# Patient Record
Sex: Female | Born: 1955 | Race: White | Hispanic: No | Marital: Married | Smoking: Never smoker
Health system: Southern US, Community
[De-identification: ages and names within clinical notes are randomized; demographics above are authoritative.]

## PROBLEM LIST (undated history)

## (undated) DIAGNOSIS — Z8781 Personal history of (healed) traumatic fracture: Secondary | ICD-10-CM

## (undated) DIAGNOSIS — I10 Essential (primary) hypertension: Secondary | ICD-10-CM

## (undated) DIAGNOSIS — C801 Malignant (primary) neoplasm, unspecified: Secondary | ICD-10-CM

## (undated) DIAGNOSIS — H209 Unspecified iridocyclitis: Secondary | ICD-10-CM

## (undated) DIAGNOSIS — R7303 Prediabetes: Secondary | ICD-10-CM

## (undated) DIAGNOSIS — E061 Subacute thyroiditis: Secondary | ICD-10-CM

## (undated) DIAGNOSIS — Z9889 Other specified postprocedural states: Secondary | ICD-10-CM

## (undated) DIAGNOSIS — K52831 Collagenous colitis: Secondary | ICD-10-CM

## (undated) DIAGNOSIS — T7840XA Allergy, unspecified, initial encounter: Secondary | ICD-10-CM

## (undated) DIAGNOSIS — M1990 Unspecified osteoarthritis, unspecified site: Secondary | ICD-10-CM

## (undated) DIAGNOSIS — H269 Unspecified cataract: Secondary | ICD-10-CM

## (undated) DIAGNOSIS — E785 Hyperlipidemia, unspecified: Secondary | ICD-10-CM

## (undated) DIAGNOSIS — K219 Gastro-esophageal reflux disease without esophagitis: Secondary | ICD-10-CM

## (undated) DIAGNOSIS — G510 Bell's palsy: Secondary | ICD-10-CM

## (undated) DIAGNOSIS — E559 Vitamin D deficiency, unspecified: Secondary | ICD-10-CM

## (undated) HISTORY — DX: Bell's palsy: G51.0

## (undated) HISTORY — DX: Essential (primary) hypertension: I10

## (undated) HISTORY — DX: Gastro-esophageal reflux disease without esophagitis: K21.9

## (undated) HISTORY — DX: Unspecified iridocyclitis: H20.9

## (undated) HISTORY — DX: Allergy, unspecified, initial encounter: T78.40XA

## (undated) HISTORY — DX: Prediabetes: R73.03

## (undated) HISTORY — DX: Unspecified cataract: H26.9

## (undated) HISTORY — DX: Other specified postprocedural states: Z98.890

## (undated) HISTORY — PX: FRACTURE SURGERY: SHX138

## (undated) HISTORY — DX: Collagenous colitis: K52.831

## (undated) HISTORY — DX: Hyperlipidemia, unspecified: E78.5

## (undated) HISTORY — DX: Malignant (primary) neoplasm, unspecified: C80.1

## (undated) HISTORY — DX: Unspecified osteoarthritis, unspecified site: M19.90

## (undated) HISTORY — DX: Vitamin D deficiency, unspecified: E55.9

## (undated) HISTORY — DX: Subacute thyroiditis: E06.1

## (undated) HISTORY — DX: Personal history of (healed) traumatic fracture: Z87.81

## (undated) HISTORY — PX: ABDOMINAL HYSTERECTOMY: SHX81

## (undated) HISTORY — PX: TONSILLECTOMY AND ADENOIDECTOMY: SUR1326

---
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
## 1992-01-17 DIAGNOSIS — Z8781 Personal history of (healed) traumatic fracture: Secondary | ICD-10-CM

## 1992-01-17 DIAGNOSIS — Z9889 Other specified postprocedural states: Secondary | ICD-10-CM

## 1992-01-17 HISTORY — DX: Other specified postprocedural states: Z98.890

## 1992-01-17 HISTORY — DX: Personal history of (healed) traumatic fracture: Z87.81

## 1992-01-17 HISTORY — PX: OTHER SURGICAL HISTORY: SHX169

## 2001-01-16 HISTORY — PX: STAPEDECTOMY: SHX2435

## 2001-11-22 ENCOUNTER — Encounter: Admission: RE | Admit: 2001-11-22 | Discharge: 2001-11-22 | Payer: Self-pay | Admitting: Family Medicine

## 2001-11-22 ENCOUNTER — Encounter: Payer: Self-pay | Admitting: Family Medicine

## 2001-12-10 ENCOUNTER — Other Ambulatory Visit: Admission: RE | Admit: 2001-12-10 | Discharge: 2001-12-10 | Payer: Self-pay | Admitting: Family Medicine

## 2001-12-31 ENCOUNTER — Encounter: Payer: Self-pay | Admitting: Family Medicine

## 2001-12-31 ENCOUNTER — Encounter: Admission: RE | Admit: 2001-12-31 | Discharge: 2001-12-31 | Payer: Self-pay | Admitting: Family Medicine

## 2002-12-23 ENCOUNTER — Other Ambulatory Visit: Admission: RE | Admit: 2002-12-23 | Discharge: 2002-12-23 | Payer: Self-pay | Admitting: Family Medicine

## 2002-12-29 ENCOUNTER — Encounter: Admission: RE | Admit: 2002-12-29 | Discharge: 2002-12-29 | Payer: Self-pay | Admitting: Family Medicine

## 2003-02-13 ENCOUNTER — Encounter: Admission: RE | Admit: 2003-02-13 | Discharge: 2003-02-13 | Payer: Self-pay | Admitting: Family Medicine

## 2004-12-10 IMAGING — US US PELVIS COMPLETE MODIFY
1 series · 14 of 25 positions shown · non-contrast
Comparison: none

CLINICAL DATA: Pelvic pain, question fibroids, history of heavy menses. 
COMPLETE PELVIC/TRANSVAGINAL ULTRASOUND
Transabdominal and transvaginal ultrasound of the pelvis were performed.  The uterus measures 12.1cm sagittally with a depth of 7.0cm and width of 7.4cm.  There are at least three uterine fibroids present.  The largest is in the anterior mid upper aspect measuring 3.1 x 3.5 x 2.2cm.  A second is posteriorly located in the mid body of the uterus measuring 2.5 x 2.7 x 2.9cm with a third smaller lesion posteriorly in the fundus.  The endometrium is normal measuring 7.2mm in thickness.  The right ovary is very difficult to visualize.  The best measurement is 1.8 x 1.4 x 2.4cm with a small follicle present.  The left ovary evaluation is also limited with the left ovary measuring 2.5 x 1.6 x 1.9cm.  No free fluid is seen.  
IMPRESSION
At least three uterine fibroids, one or two of which appear to be very near the endometrium.  No endometrial abnormality is seen.  Ovaries are not optimally visualized as noted above.

[Series 1: unknown · 0.29mm/px · 14 of 46 slices shown]
[im 1/46]
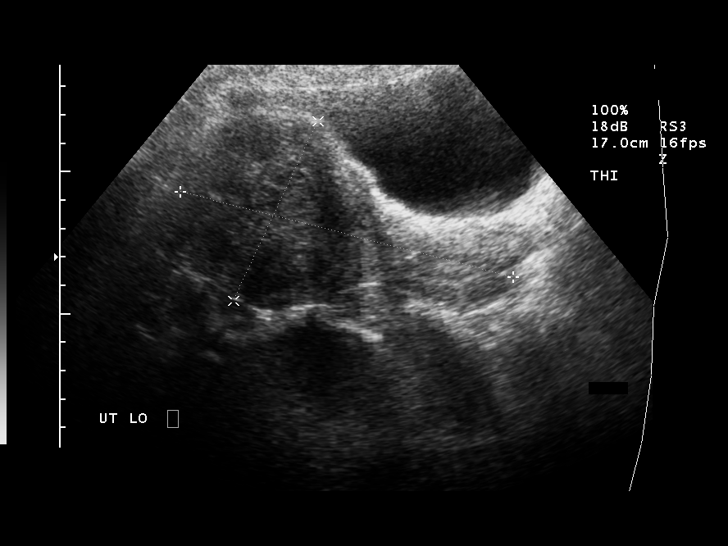
[im 4/46]
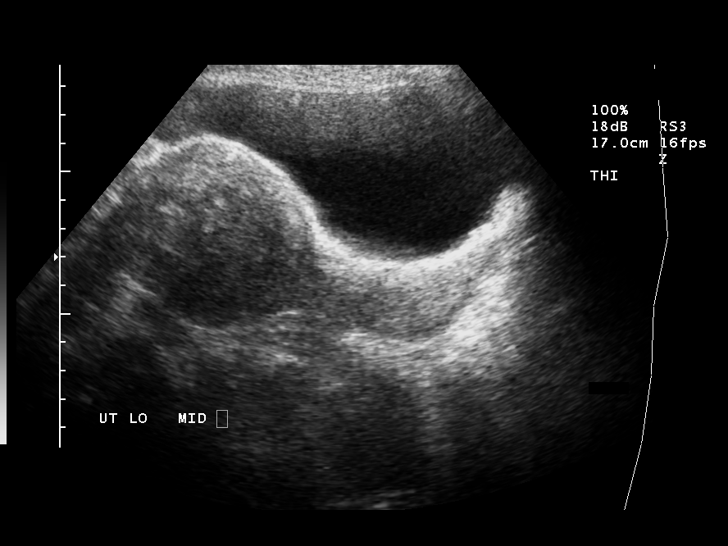
[im 8/46]
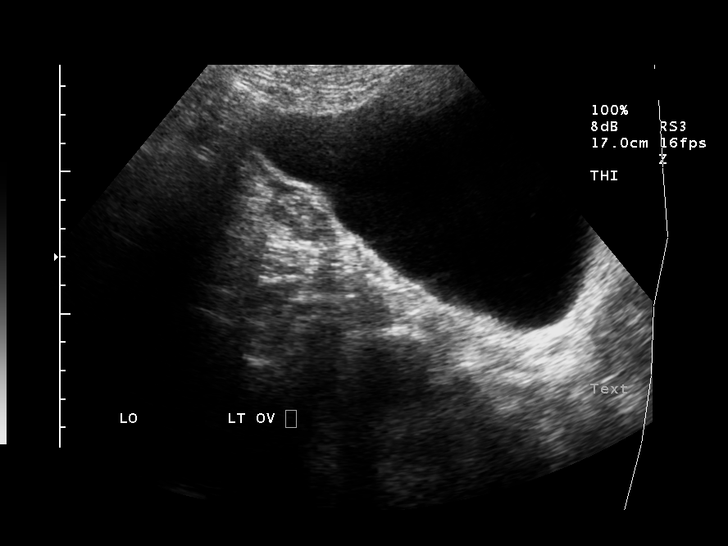
[im 12/46]
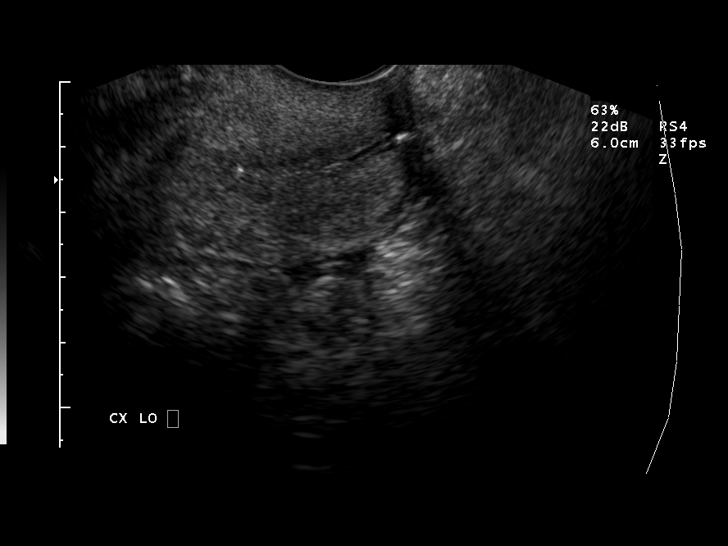
[im 16/46]
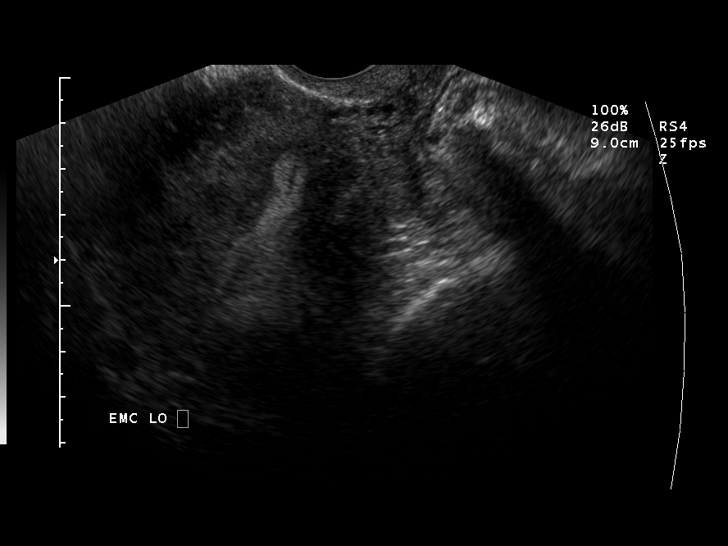
[im 17/46]
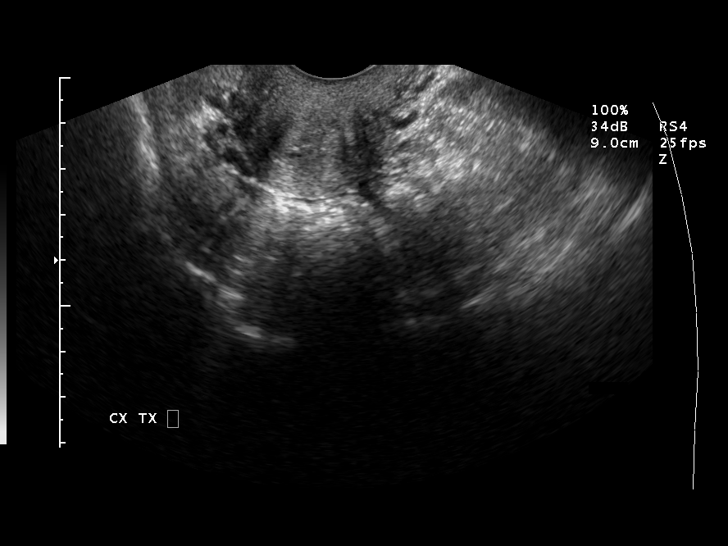
[im 21/46]
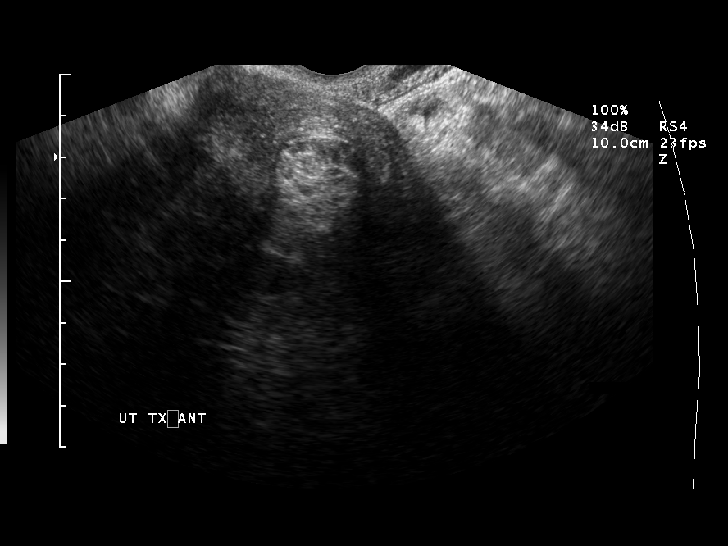
[im 25/46]
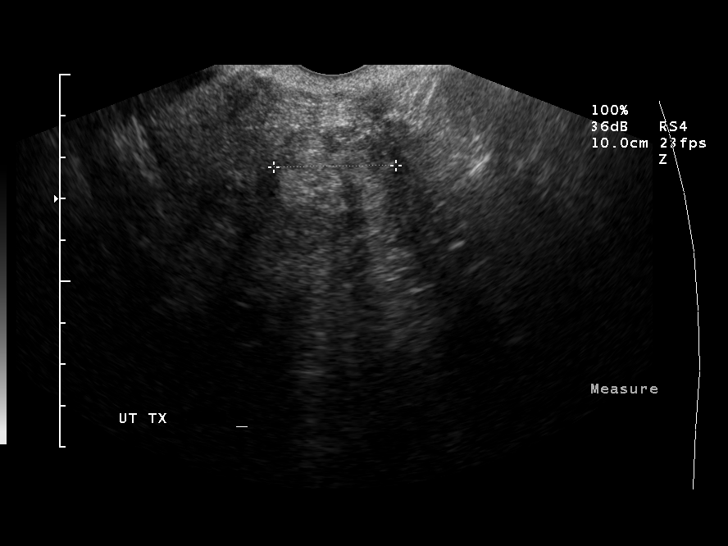
[im 29/46]
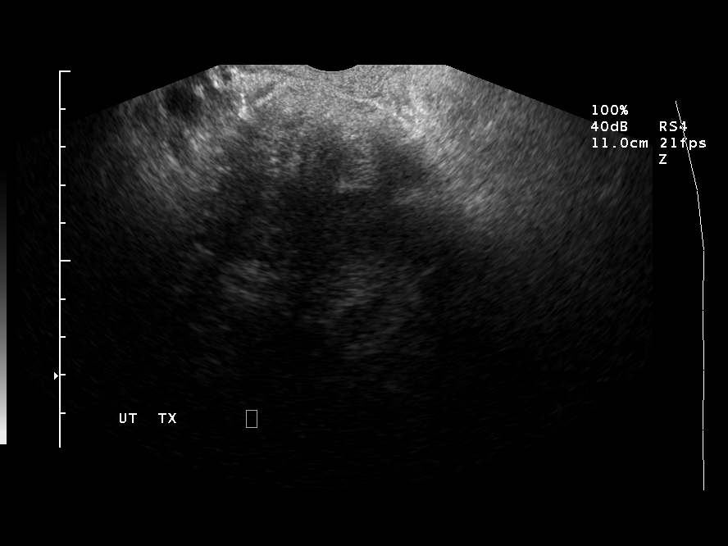
[im 31/46]
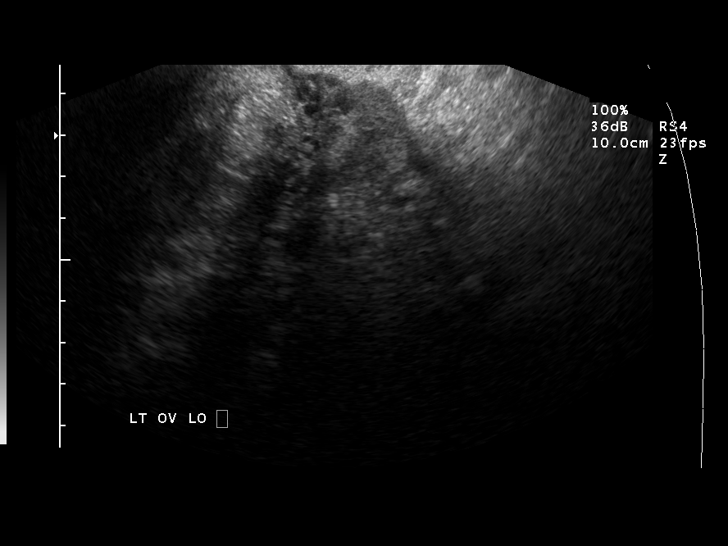
[im 34/46]
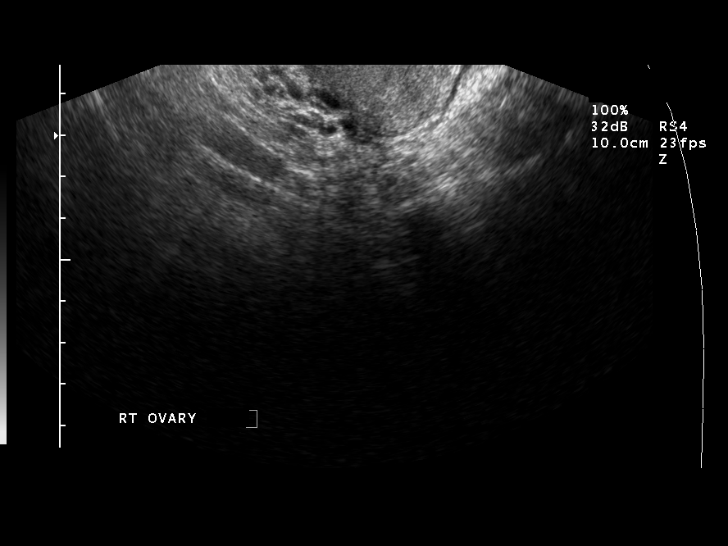
[im 38/46]
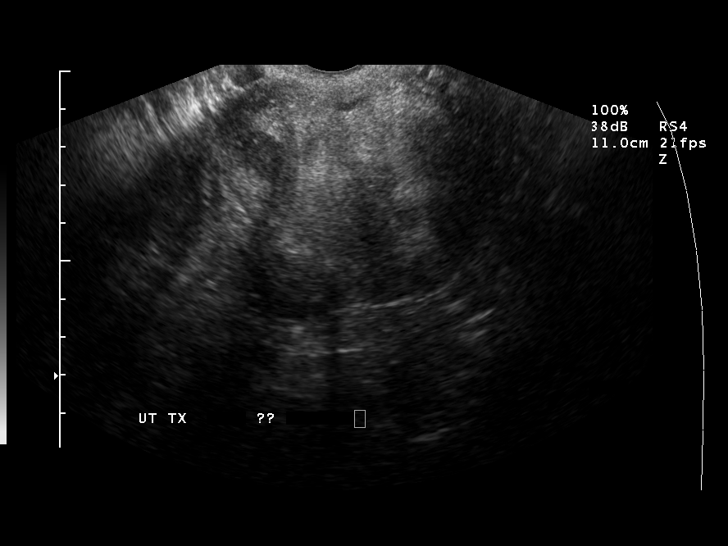
[im 42/46]
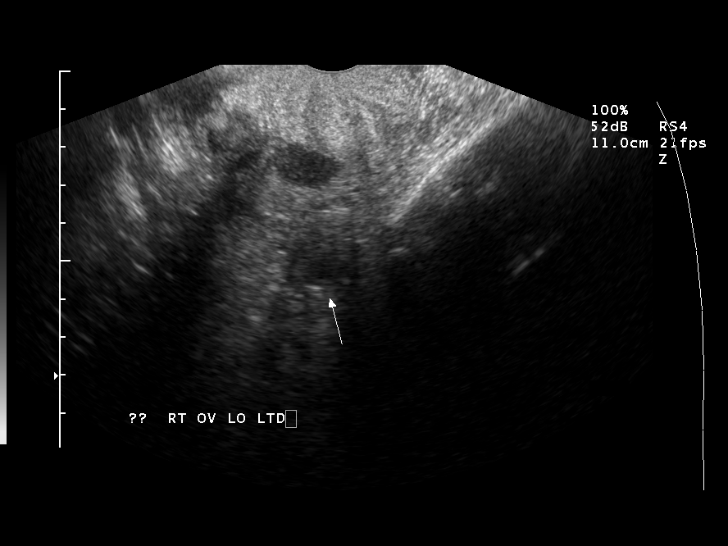
[im 46/46]
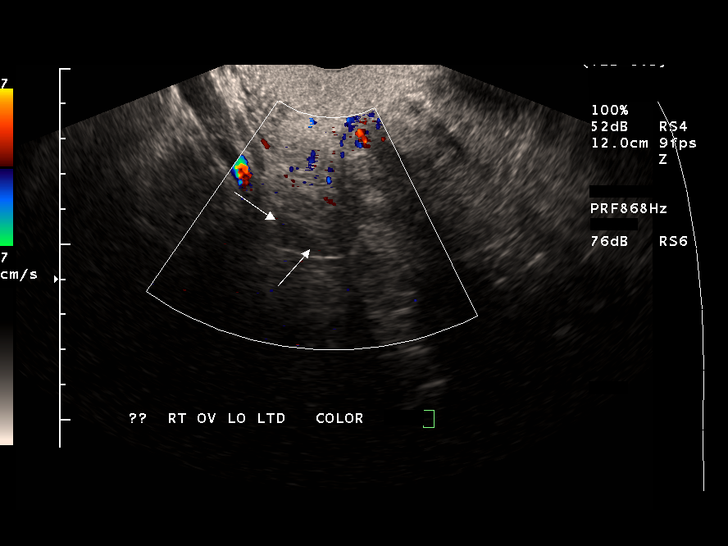

[14 of 25 positions shown; findings below may reference images not displayed]

## 2004-12-16 ENCOUNTER — Encounter: Admission: RE | Admit: 2004-12-16 | Discharge: 2004-12-16 | Payer: Self-pay | Admitting: Family Medicine

## 2005-01-04 ENCOUNTER — Encounter: Admission: RE | Admit: 2005-01-04 | Discharge: 2005-01-04 | Payer: Self-pay | Admitting: Family Medicine

## 2005-01-04 ENCOUNTER — Encounter: Payer: Self-pay | Admitting: Family Medicine

## 2005-02-01 ENCOUNTER — Other Ambulatory Visit: Admission: RE | Admit: 2005-02-01 | Discharge: 2005-02-01 | Payer: Self-pay | Admitting: Family Medicine

## 2005-02-27 ENCOUNTER — Ambulatory Visit: Payer: Self-pay | Admitting: Gastroenterology

## 2005-03-17 ENCOUNTER — Ambulatory Visit: Payer: Self-pay | Admitting: Gastroenterology

## 2005-03-17 HISTORY — PX: COLONOSCOPY: SHX174

## 2006-05-17 ENCOUNTER — Encounter: Admission: RE | Admit: 2006-05-17 | Discharge: 2006-05-17 | Payer: Self-pay | Admitting: Family Medicine

## 2006-05-25 ENCOUNTER — Other Ambulatory Visit: Admission: RE | Admit: 2006-05-25 | Discharge: 2006-05-25 | Payer: Self-pay | Admitting: Family Medicine

## 2006-08-22 ENCOUNTER — Encounter: Admission: RE | Admit: 2006-08-22 | Discharge: 2006-08-22 | Payer: Self-pay | Admitting: Family Medicine

## 2006-09-12 ENCOUNTER — Encounter: Admission: RE | Admit: 2006-09-12 | Discharge: 2006-09-12 | Payer: Self-pay | Admitting: Family Medicine

## 2006-09-15 ENCOUNTER — Encounter: Admission: RE | Admit: 2006-09-15 | Discharge: 2006-09-15 | Payer: Self-pay | Admitting: Family Medicine

## 2007-03-24 LAB — HM COLONOSCOPY

## 2007-06-11 ENCOUNTER — Encounter: Admission: RE | Admit: 2007-06-11 | Discharge: 2007-06-11 | Payer: Self-pay | Admitting: Obstetrics and Gynecology

## 2008-05-25 ENCOUNTER — Encounter (HOSPITAL_COMMUNITY): Admission: RE | Admit: 2008-05-25 | Discharge: 2008-07-15 | Payer: Self-pay | Admitting: Internal Medicine

## 2010-02-05 ENCOUNTER — Encounter: Payer: Self-pay | Admitting: Family Medicine

## 2010-05-08 IMAGING — NM NM THYROID UPTAKE SINGLE (24 HR)
1 series · 1 of 1 positions shown · non-contrast
Comparison: None available

CLINICAL DATA: Hyperthyroid with TSH equal

NUCLEAR MEDICINE 24 HOUR THYROID UPTAKE
TECHNIQUE: Following  per oral administered of A-MKM, thyroid
radioiodine uptake was calculated at  24 hours.
Radiopharmaceutical: 8.8 microcuries I 131

[Series 1: st static · 1 of 1 slices shown]
[im 1/1]
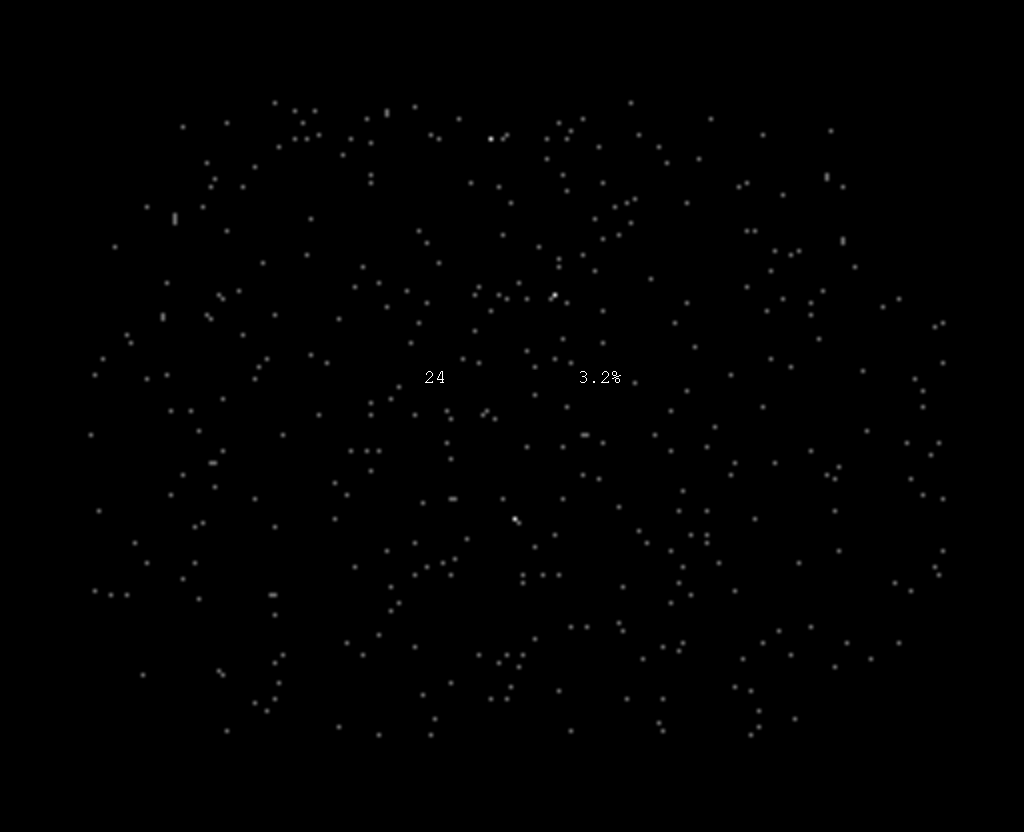

[1 of 1 positions shown; findings below may reference images not displayed]

FINDINGS: 24 hour I 131 uptake = 3.2 % (normal 10-30%)
IMPRESSION: Low 24 hour I 131 uptake.  No imaging performed per protocol with
low uptake.  Hyperthyroidism with low I 131 uptake commonly
represents subacute thyroiditis.

## 2012-12-23 ENCOUNTER — Encounter: Payer: Self-pay | Admitting: Physician Assistant

## 2012-12-25 ENCOUNTER — Encounter: Payer: Self-pay | Admitting: Physician Assistant

## 2012-12-25 ENCOUNTER — Ambulatory Visit (INDEPENDENT_AMBULATORY_CARE_PROVIDER_SITE_OTHER): Payer: BC Managed Care – PPO | Admitting: Physician Assistant

## 2012-12-25 VITALS — BP 132/82 | HR 72 | Temp 97.9°F | Resp 16 | Ht 61.5 in | Wt 133.0 lb

## 2012-12-25 DIAGNOSIS — R7309 Other abnormal glucose: Secondary | ICD-10-CM | POA: Insufficient documentation

## 2012-12-25 DIAGNOSIS — E559 Vitamin D deficiency, unspecified: Secondary | ICD-10-CM | POA: Insufficient documentation

## 2012-12-25 DIAGNOSIS — E782 Mixed hyperlipidemia: Secondary | ICD-10-CM | POA: Insufficient documentation

## 2012-12-25 DIAGNOSIS — E785 Hyperlipidemia, unspecified: Secondary | ICD-10-CM

## 2012-12-25 DIAGNOSIS — I10 Essential (primary) hypertension: Secondary | ICD-10-CM

## 2012-12-25 DIAGNOSIS — R7303 Prediabetes: Secondary | ICD-10-CM

## 2012-12-25 LAB — CBC WITH DIFFERENTIAL/PLATELET
Basophils Absolute: 0 10*3/uL (ref 0.0–0.1)
HCT: 38.3 % (ref 36.0–46.0)
Hemoglobin: 13.2 g/dL (ref 12.0–15.0)
Lymphocytes Relative: 27 % (ref 12–46)
Lymphs Abs: 2.1 10*3/uL (ref 0.7–4.0)
Monocytes Absolute: 0.5 10*3/uL (ref 0.1–1.0)
Monocytes Relative: 6 % (ref 3–12)
Neutro Abs: 4.8 10*3/uL (ref 1.7–7.7)
RBC: 4.49 MIL/uL (ref 3.87–5.11)
RDW: 13.5 % (ref 11.5–15.5)
WBC: 7.6 10*3/uL (ref 4.0–10.5)

## 2012-12-25 LAB — HEPATIC FUNCTION PANEL
Alkaline Phosphatase: 102 U/L (ref 39–117)
Indirect Bilirubin: 0.3 mg/dL (ref 0.0–0.9)
Total Protein: 6.8 g/dL (ref 6.0–8.3)

## 2012-12-25 LAB — TSH: TSH: 1.221 u[IU]/mL (ref 0.350–4.500)

## 2012-12-25 LAB — BASIC METABOLIC PANEL WITH GFR
BUN: 15 mg/dL (ref 6–23)
CO2: 26 mEq/L (ref 19–32)
Chloride: 105 mEq/L (ref 96–112)
GFR, Est Non African American: >89 mL/min
Potassium: 4.1 mEq/L (ref 3.5–5.3)

## 2012-12-25 LAB — LIPID PANEL
HDL: 34 mg/dL — ABNORMAL LOW (ref >39)
LDL Cholesterol: 52 mg/dL (ref 0–99)
Total CHOL/HDL Ratio: 4.1 Ratio

## 2012-12-25 NOTE — Patient Instructions (Signed)
   Bad carbs also include fruit juice, alcohol, and sweet tea. These are empty calories that do not signal to your brain that you are full.   Please remember the good carbs are still carbs which convert into sugar. So please measure them out no more than 1/2-1 cup of rice, oatmeal, pasta, and beans.  Veggies are however free foods! Pile them on.   I like lean protein at every meal such as chicken, [place], pork chops, cottage cheese, etc. Just do not fry these meats and please center your meal around vegetable, the meats should be a side dish.   No all fruit is created equal. Please see the list below, the fruit at the bottom is higher in sugars than the fruit at the top   Vascular Dementia Vascular dementia is a common cause of dementia in the elderly. Dementia is a condition that affects the brain and causes people to not think well or act normally. Vascular dementia is one type of dementia. It occurs when blood clots block small blood vessels in the brain and destroy brain tissue. Likely risk factors are high blood pressure and advanced age. This disease can cause stroke, migraine-like headaches, and psychiatric disturbances.  SYMPTOMS   Confusion.  Problems with recent memory.  Wandering or getting lost in familiar places.  Loss of bladder or bowel control (incontinence).  Unsteady gait.  Poor attention and concentration.  Emotional problems such as laughing or crying inappropriately.  Difficulty following instructions.  Problems handling money.  Depression.  Difficulty planning ahead. Usually the damage is slight at first. Over time, as more small vessels are blocked, there is a gradual mental decline. However, symptoms may begin suddenly. Symptoms may be very similar to Alzheimer's disease. The two forms of dementia may occur together. Vascular dementia typically begins between the ages of 58 and 78. It affects men more often than women. TREATMENT   Currently there is no  treatment for vascular dementia that can reverse the damage that has already occurred.  Treatment focuses on prevention of additional brain damage and improvement of symptoms.  It is important to treat the risk factors for vascular dementia, such as keeping blood pressure under control, treating diabetes, lowering cholesterol, and stop smoking. PROGNOSIS   Prognosis for patients is generally poor. Individuals with the disease may improve for short periods of time, then get worse again. Early treatment and management of blood pressure and other risk factors may prevent further worsening of the disorder. Document Released: 12/23/2001 Document Revised: 03/27/2011 Document Reviewed: 11/06/2008 [facility] Patient Information 2014 [place].

## 2012-12-25 NOTE — Progress Notes (Signed)
HPI Patient presents for 3 month follow up with hypertension, hyperlipidemia, prediabetes and vitamin D. Patient's blood pressure has been controlled at home. Patient denies chest pain, shortness of breath, dizziness.  Patient's cholesterol is diet controlled. The cholesterol last visit was LDL 71,HDL 38, trigs 184. The patient has been working on diet and exercise for prediabetes, and denies changes in vision, polys, and paresthesias. A1C 5.8 Patient is on Vitamin D supplement.  Father has dementia and is patient here, she has been under stress due to this and due to her mother.  Current Medications:  Current Outpatient Prescriptions on File Prior to Visit  Medication Sig Dispense Refill  . atropine 1 % ophthalmic solution 3 (three) times daily.      . cetirizine (ZYRTEC) 10 MG tablet Take 10 mg by mouth daily.      . cholecalciferol (VITAMIN D) 1000 UNITS tablet Take 4,000 Units by mouth daily.      . cloNIDine (CATAPRES) 0.1 MG tablet Take 0.1 mg by mouth 2 (two) times daily.      . enalapril (VASOTEC) 20 MG tablet Take 20 mg by mouth daily.      . Flaxseed, Linseed, (FLAX SEED OIL PO) Take 4,800 mg by mouth daily.       Marland Kitchen ketorolac (ACULAR) 0.5 % ophthalmic solution 1 drop 4 (four) times daily.      . Multiple Vitamin (MULTIVITAMIN) capsule Take 1 capsule by mouth daily.      . multivitamin-lutein (OCUVITE-LUTEIN) CAPS capsule Take 1 capsule by mouth daily.      . Probiotic Product (PROBIOTIC & ACIDOPHILUS EX ST PO) Take by mouth.       No current facility-administered medications on file prior to visit.   Medical History:  Past Medical History  Diagnosis Date  . Hypertension   . Hyperlipidemia   . GERD (gastroesophageal reflux disease)   . Prediabetes   . Vitamin D deficiency   . Thyroiditis, subacute   . Iritis     HLA B27 +  . S/P ORIF (open reduction internal fixation) fracture 1994    Right tib/fib   Allergies: No Known Allergies  ROS Constitutional: Denies fever,  chills, weight loss/gain, headaches, insomnia, fatigue, night sweats, and change in appetite. Eyes: Denies redness, blurred vision, diplopia, discharge, itchy, watery eyes.  ENT: Denies discharge, congestion, post nasal drip, sore throat, earache, dental pain, Tinnitus, Vertigo, Sinus pain, snoring.  Cardio: Denies chest pain, palpitations, irregular heartbeat,  dyspnea, diaphoresis, orthopnea, PND, claudication, edema Respiratory: denies cough, dyspnea,pleurisy, hoarseness, wheezing.  Gastrointestinal: Denies dysphagia, heartburn,  water brash, pain, cramps, nausea, vomiting, bloating, diarrhea, constipation, hematemesis, melena, hematochezia,  hemorrhoids Genitourinary: Denies dysuria, frequency, urgency, nocturia, hesitancy, discharge, hematuria, flank pain Musculoskeletal: Denies arthralgia, myalgia, stiffness, Jt. Swelling, pain, limp, and strain/sprain. Skin: Denies pruritis, rash, hives, warts, acne, eczema, changing in skin lesion Neuro: Denies Weakness, tremor, incoordination, spasms, paresthesia, pain Psychiatric: Denies confusion, memory loss, sensory loss Endocrine: Denies change in weight, skin, hair change, nocturia, and paresthesia, Diabetic Polys, Denies visual blurring, hyper /hypo glycemic episodes.  Heme/Lymph: Denies Excessive bleeding, bruising, enlarged lymph nodes  Family history- Review and unchanged Social history- Review and unchanged Physical Exam: Filed Vitals:   12/25/12 1028  BP: 132/82  Pulse: 72  Temp: 97.9 F (36.6 C)  Resp: 16   Filed Weights   12/25/12 1028  Weight: 133 lb (60.328 kg)   General Appearance: Well nourished, in no apparent distress. Eyes: PERRLA, EOMs, conjunctiva no swelling or erythema, normal  fundi and vessels. Sinuses: No Frontal/maxillary tenderness ENT/Mouth: Ext aud canals clear, with TMs without erythema, bulging.No erythema, swelling, or exudate on post pharynx.  Tonsils not swollen or erythematous. Hearing normal.  Neck:  Supple, thyroid normal.  Respiratory: Respiratory effort normal, BS equal bilaterally without rales, rhonchi, wheezing or stridor.  Cardio: Heart sounds normal, regular rate and rhythm without murmurs, rubs or gallops. Peripheral pulses brisk and equal bilaterally, without edema.  Abdomen: Flat, soft, with bowel sounds. Non tender, no guarding, rebound, hernias, masses, or organomegaly.  Lymphatics: Non tender without lymphadenopathy.  Musculoskeletal: Full ROM all peripheral extremities, joint stability, 5/5 strength, and normal gait. Skin: Warm, dry without rashes, lesions, ecchymosis.  Neuro: Cranial nerves intact, reflexes equal bilaterally. Normal muscle tone, no cerebellar symptoms. Sensation intact.  Psych: Awake and oriented X 3, normal affect, Insight and Judgment appropriate.   Assessment and Plan:  Hypertension: Continue medication, monitor blood pressure at home. Continue DASH diet. Cholesterol: Continue diet and exercise. Check cholesterol.  Pre-diabetes-Continue diet and exercise. Check A1C Vitamin D Def- check level and continue medications.   Vicie Mutters 10:47 AM

## 2013-01-16 HISTORY — PX: ROBOTIC ASSISTED TOTAL HYSTERECTOMY: SHX6085

## 2013-03-26 ENCOUNTER — Ambulatory Visit: Payer: Self-pay | Admitting: Internal Medicine

## 2013-07-23 ENCOUNTER — Other Ambulatory Visit: Payer: Self-pay | Admitting: Internal Medicine

## 2013-09-30 ENCOUNTER — Encounter: Payer: Self-pay | Admitting: Internal Medicine

## 2013-09-30 ENCOUNTER — Ambulatory Visit (INDEPENDENT_AMBULATORY_CARE_PROVIDER_SITE_OTHER): Payer: BC Managed Care – PPO | Admitting: Internal Medicine

## 2013-09-30 VITALS — BP 126/78 | HR 80 | Temp 97.5°F | Resp 16 | Ht 61.5 in | Wt 123.8 lb

## 2013-09-30 DIAGNOSIS — R7401 Elevation of levels of liver transaminase levels: Secondary | ICD-10-CM

## 2013-09-30 DIAGNOSIS — E785 Hyperlipidemia, unspecified: Secondary | ICD-10-CM

## 2013-09-30 DIAGNOSIS — R7309 Other abnormal glucose: Secondary | ICD-10-CM

## 2013-09-30 DIAGNOSIS — Z111 Encounter for screening for respiratory tuberculosis: Secondary | ICD-10-CM

## 2013-09-30 DIAGNOSIS — R740 Nonspecific elevation of levels of transaminase and lactic acid dehydrogenase [LDH]: Secondary | ICD-10-CM

## 2013-09-30 DIAGNOSIS — R7402 Elevation of levels of lactic acid dehydrogenase (LDH): Secondary | ICD-10-CM

## 2013-09-30 DIAGNOSIS — Z113 Encounter for screening for infections with a predominantly sexual mode of transmission: Secondary | ICD-10-CM

## 2013-09-30 DIAGNOSIS — E559 Vitamin D deficiency, unspecified: Secondary | ICD-10-CM

## 2013-09-30 DIAGNOSIS — Z1212 Encounter for screening for malignant neoplasm of rectum: Secondary | ICD-10-CM

## 2013-09-30 DIAGNOSIS — R7303 Prediabetes: Secondary | ICD-10-CM

## 2013-09-30 DIAGNOSIS — I10 Essential (primary) hypertension: Secondary | ICD-10-CM

## 2013-09-30 DIAGNOSIS — Z0000 Encounter for general adult medical examination without abnormal findings: Secondary | ICD-10-CM

## 2013-09-30 LAB — CBC WITH DIFFERENTIAL/PLATELET
BASOS ABS: 0 10*3/uL (ref 0.0–0.1)
BASOS PCT: 0 % (ref 0–1)
Eosinophils Absolute: 0.1 10*3/uL (ref 0.0–0.7)
Eosinophils Relative: 1 % (ref 0–5)
HEMATOCRIT: 37.7 % (ref 36.0–46.0)
HEMOGLOBIN: 12.8 g/dL (ref 12.0–15.0)
Lymphocytes Relative: 33 % (ref 12–46)
Lymphs Abs: 1.9 10*3/uL (ref 0.7–4.0)
MCH: 29.4 pg (ref 26.0–34.0)
MCHC: 34 g/dL (ref 30.0–36.0)
MCV: 86.7 fL (ref 78.0–100.0)
MONO ABS: 0.4 10*3/uL (ref 0.1–1.0)
Monocytes Relative: 6 % (ref 3–12)
NEUTROS ABS: 3.5 10*3/uL (ref 1.7–7.7)
Neutrophils Relative %: 60 % (ref 43–77)
Platelets: 250 10*3/uL (ref 150–400)
RBC: 4.35 MIL/uL (ref 3.87–5.11)
RDW: 13.2 % (ref 11.5–15.5)
WBC: 5.9 10*3/uL (ref 4.0–10.5)

## 2013-09-30 NOTE — Patient Instructions (Signed)
 Recommend the book "The END of DIETING" by Dr Joel Furman   and the book "The END of DIABETES " by Dr Joel Fuhrman  At [employer] - get book & Audio CD's      Being diabetic has a  300% increased risk for heart attack, stroke, cancer, and alzheimer- type vascular dementia. It is very important that you work harder with diet by avoiding all foods that are white except chicken & fish. Avoid white rice (brown & wild rice is OK), white potatoes (sweetpotatoes in moderation is OK), White bread or wheat bread or anything made out of white flour like bagels, donuts, rolls, buns, biscuits, cakes, pastries, cookies, pizza crust, and pasta (made from white flour & egg whites) - vegetarian pasta or spinach or wheat pasta is OK. Multigrain breads like Arnold's or [employer], or multigrain sandwich thins or flatbreads.  Diet, exercise and weight loss can reverse and cure diabetes in the early stages.  Diet, exercise and weight loss is very important in the control and prevention of complications of diabetes which affects every system in your body, ie. Brain - dementia/stroke, eyes - glaucoma/blindness, heart - heart attack/heart failure, kidneys - dialysis, stomach - gastric paralysis, intestines - malabsorption, nerves - severe painful neuritis, circulation - gangrene & loss of a leg(s), and finally cancer and Alzheimers.    I recommend avoid fried & greasy foods,  sweets/candy, white rice (brown or wild rice or Quinoa is OK), white potatoes (sweet potatoes are OK) - anything made from white flour - bagels, doughnuts, rolls, buns, biscuits,white and wheat breads, pizza crust and traditional pasta made of white flour & egg white(vegetarian pasta or spinach or wheat pasta is OK).  Multi-grain bread is OK - like multi-grain flat bread or sandwich thins. Avoid alcohol in excess. Exercise is also important.    Eat all the vegetables you want - avoid meat, especially red meat and dairy - especially cheese.  Cheese  is the most concentrated form of trans-fats which is the worst thing to clog up our arteries. Veggie cheese is OK which can be found in the fresh produce section at [employer] or [employer] or [employer]  Preventive Care for Adults A healthy lifestyle and preventive care can promote health and wellness. Preventive health guidelines for women include the following key practices.  A routine yearly physical is a good way to check with your health care provider about your health and preventive screening. It is a chance to share any concerns and updates on your health and to receive a thorough exam.  Visit your dentist for a routine exam and preventive care every 6 months. Brush your teeth twice a day and floss once a day. Good oral hygiene prevents tooth decay and gum disease.  The frequency of eye exams is based on your age, health, family medical history, use of contact lenses, and other factors. Follow your health care provider's recommendations for frequency of eye exams.  Eat a healthy diet. Foods like vegetables, fruits, whole grains, low-fat dairy products, and lean protein foods contain the nutrients you need without too many calories. Decrease your intake of foods high in solid fats, added sugars, and salt. Eat the right amount of calories for you.Get information about a proper diet from your health care provider, if necessary.  Regular physical exercise is one of the most important things you can do for your health. Most adults should get at least 150 minutes of moderate-intensity exercise (any activity that increases   your heart rate and causes you to sweat) each week. In addition, most adults need muscle-strengthening exercises on 2 or more days a week.  Maintain a healthy weight. The body mass index (BMI) is a screening tool to identify possible weight problems. It provides an estimate of body fat based on height and weight. Your health care provider can find your BMI and can help you  achieve or maintain a healthy weight.For adults 20 years and older:  A BMI below 18.5 is considered underweight.  A BMI of 18.5 to 24.9 is normal.  A BMI of 25 to 29.9 is considered overweight.  A BMI of 30 and above is considered obese.  Maintain normal blood lipids and cholesterol levels by exercising and minimizing your intake of saturated fat. Eat a balanced diet with plenty of fruit and vegetables. Blood tests for lipids and cholesterol should begin at age 20 and be repeated every 5 years. If your lipid or cholesterol levels are high, you are over 50, or you are at high risk for heart disease, you may need your cholesterol levels checked more frequently.Ongoing high lipid and cholesterol levels should be treated with medicines if diet and exercise are not working.  If you smoke, find out from your health care provider how to quit. If you do not use tobacco, do not start.  Lung cancer screening is recommended for adults aged 55-80 years who are at high risk for developing lung cancer because of a history of smoking. A yearly low-dose CT scan of the lungs is recommended for people who have at least a 30-pack-year history of smoking and are a current smoker or have quit within the past 15 years. A pack year of smoking is smoking an average of 1 pack of cigarettes a day for 1 year (for example: 1 pack a day for 30 years or 2 packs a day for 15 years). Yearly screening should continue until the smoker has stopped smoking for at least 15 years. Yearly screening should be stopped for people who develop a health problem that would prevent them from having lung cancer treatment.  If you are pregnant, do not drink alcohol. If you are breastfeeding, be very cautious about drinking alcohol. If you are not pregnant and choose to drink alcohol, do not have more than 1 drink per day. One drink is considered to be 12 ounces (355 mL) of beer, 5 ounces (148 mL) of wine, or 1.5 ounces (44 mL) of liquor.  Avoid  use of street drugs. Do not share needles with anyone. Ask for help if you need support or instructions about stopping the use of drugs.  High blood pressure causes heart disease and increases the risk of stroke. Your blood pressure should be checked at least every 1 to 2 years. Ongoing high blood pressure should be treated with medicines if weight loss and exercise do not work.  If you are 55-79 years old, ask your health care provider if you should take aspirin to prevent strokes.  Diabetes screening involves taking a blood sample to check your fasting blood sugar level. This should be done once every 3 years, after age 45, if you are within normal weight and without risk factors for diabetes. Testing should be considered at a younger age or be carried out more frequently if you are overweight and have at least 1 risk factor for diabetes.  Breast cancer screening is essential preventive care for women. You should practice "breast self-awareness." This means understanding the   the normal appearance and feel of your breasts and may include breast self-examination. Any changes detected, no matter how small, should be reported to a health care provider. Women in their 89s and 30s should have a clinical breast exam (CBE) by a health care provider as part of a regular health exam every 1 to 3 years. After age 42, women should have a CBE every year. Starting at age 31, women should consider having a mammogram (breast X-ray test) every year. Women who have a family history of breast cancer should talk to their health care provider about genetic screening. Women at a high risk of breast cancer should talk to their health care providers about having an MRI and a mammogram every year.  Breast cancer gene (BRCA)-related cancer risk assessment is recommended for women who have family members with BRCA-related cancers. BRCA-related cancers include breast, ovarian, tubal, and peritoneal cancers. Having family members with  these cancers may be associated with an increased risk for harmful changes (mutations) in the breast cancer genes BRCA1 and BRCA2. Results of the assessment will determine the need for genetic counseling and BRCA1 and BRCA2 testing.  Routine pelvic exams to screen for cancer are no longer recommended for nonpregnant women who are considered low risk for cancer of the pelvic organs (ovaries, uterus, and vagina) and who do not have symptoms. Ask your health care provider if a screening pelvic exam is right for you.  If you have had past treatment for cervical cancer or a condition that could lead to cancer, you need Pap tests and screening for cancer for at least 20 years after your treatment. If Pap tests have been discontinued, your risk factors (such as having a new sexual partner) need to be reassessed to determine if screening should be resumed. Some women have medical problems that increase the chance of getting cervical cancer. In these cases, your health care provider may recommend more frequent screening and Pap tests.  The HPV test is an additional test that may be used for cervical cancer screening. The HPV test looks for the virus that can cause the cell changes on the cervix. The cells collected during the Pap test can be tested for HPV. The HPV test could be used to screen women aged 31 years and older, and should be used in women of any age who have unclear Pap test results. After the age of 56, women should have HPV testing at the same frequency as a Pap test.  Colorectal cancer can be detected and often prevented. Most routine colorectal cancer screening begins at the age of 35 years and continues through age 14 years. However, your health care provider may recommend screening at an earlier age if you have risk factors for colon cancer. On a yearly basis, your health care provider may provide home test kits to check for hidden blood in the stool. Use of a small camera at the end of a tube, to  directly examine the colon (sigmoidoscopy or colonoscopy), can detect the earliest forms of colorectal cancer. Talk to your health care provider about this at age 1, when routine screening begins. Direct exam of the colon should be repeated every 5-10 years through age 76 years, unless early forms of pre-cancerous polyps or small growths are found.  People who are at an increased risk for hepatitis B should be screened for this virus. You are considered at high risk for hepatitis B if:  You were born in a country where hepatitis B  often. Talk with your health care provider about which countries are considered high risk.  Your parents were born in a high-risk country and you have not received a shot to protect against hepatitis B (hepatitis B vaccine).  You have HIV or AIDS.  You use needles to inject street drugs.  You live with, or have sex with, someone who has hepatitis B.  You get hemodialysis treatment.  You take certain medicines for conditions like cancer, organ transplantation, and autoimmune conditions.  Hepatitis C blood testing is recommended for all people born from 1945 through 1965 and any individual with known risks for hepatitis C.  Practice safe sex. Use condoms and avoid high-risk sexual practices to reduce the spread of sexually transmitted infections (STIs). STIs include gonorrhea, chlamydia, syphilis, trichomonas, herpes, HPV, and human immunodeficiency virus (HIV). Herpes, HIV, and HPV are viral illnesses that have no cure. They can result in disability, cancer, and death.  You should be screened for sexually transmitted illnesses (STIs) including gonorrhea and chlamydia if:  You are sexually active and are younger than 24 years.  You are older than 24 years and your health care provider tells you that you are at risk for this type of infection.  Your sexual activity has changed since you were last screened and you are at an increased risk for chlamydia or  gonorrhea. Ask your health care provider if you are at risk.  If you are at risk of being infected with HIV, it is recommended that you take a prescription medicine daily to prevent HIV infection. This is called preexposure prophylaxis (PrEP). You are considered at risk if:  You are a heterosexual woman, are sexually active, and are at increased risk for HIV infection.  You take drugs by injection.  You are sexually active with a partner who has HIV.  Talk with your health care provider about whether you are at high risk of being infected with HIV. If you choose to begin PrEP, you should first be tested for HIV. You should then be tested every 3 months for as long as you are taking PrEP.  Osteoporosis is a disease in which the bones lose minerals and strength with aging. This can result in serious bone fractures or breaks. The risk of osteoporosis can be identified using a bone density scan. Women ages 65 years and over and women at risk for fractures or osteoporosis should discuss screening with their health care providers. Ask your health care provider whether you should take a calcium supplement or vitamin D to reduce the rate of osteoporosis.  Menopause can be associated with physical symptoms and risks. Hormone replacement therapy is available to decrease symptoms and risks. You should talk to your health care provider about whether hormone replacement therapy is right for you.  Use sunscreen. Apply sunscreen liberally and repeatedly throughout the day. You should seek shade when your shadow is shorter than you. Protect yourself by wearing long sleeves, pants, a wide-brimmed hat, and sunglasses year round, whenever you are outdoors.  Once a month, do a whole body skin exam, using a mirror to look at the skin on your back. Tell your health care provider of new moles, moles that have irregular borders, moles that are larger than a pencil eraser, or moles that have changed in shape or  color.  Stay current with required vaccines (immunizations).  Influenza vaccine. All adults should be immunized every year.  Tetanus, diphtheria, and acellular pertussis (Td, Tdap) vaccine. Pregnant women should receive   receive 1 dose of Tdap vaccine during each pregnancy. The dose should be obtained regardless of the length of time since the last dose. Immunization is preferred during the 27th-36th week of gestation. An adult who has not previously received Tdap or who does not know her vaccine status should receive 1 dose of Tdap. This initial dose should be followed by tetanus and diphtheria toxoids (Td) booster doses every 10 years. Adults with an unknown or incomplete history of completing a 3-dose immunization series with Td-containing vaccines should begin or complete a primary immunization series including a Tdap dose. Adults should receive a Td booster every 10 years.  Varicella vaccine. An adult without evidence of immunity to varicella should receive 2 doses or a second dose if she has previously received 1 dose. Pregnant females who do not have evidence of immunity should receive the first dose after pregnancy. This first dose should be obtained before leaving the health care facility. The second dose should be obtained 4-8 weeks after the first dose.  Human papillomavirus (HPV) vaccine. Females aged 13-26 years who have not received the vaccine previously should obtain the 3-dose series. The vaccine is not recommended for use in pregnant females. However, pregnancy testing is not needed before receiving a dose. If a female is found to be pregnant after receiving a dose, no treatment is needed. In that case, the remaining doses should be delayed until after the pregnancy. Immunization is recommended for any person with an immunocompromised condition through the age of 61 years if she did not get any or all doses earlier. During the 3-dose series, the second dose should be obtained 4-8 weeks after the  first dose. The third dose should be obtained 24 weeks after the first dose and 16 weeks after the second dose.  Zoster vaccine. One dose is recommended for adults aged 44 years or older unless certain conditions are present.  Measles, mumps, and rubella (MMR) vaccine. Adults born before 55 generally are considered immune to measles and mumps. Adults born in [birth year] or later should have 1 or more doses of MMR vaccine unless there is a contraindication to the vaccine or there is laboratory evidence of immunity to each of the three diseases. A routine second dose of MMR vaccine should be obtained at least 28 days after the first dose for students attending postsecondary schools, health care workers, or international travelers. People who received inactivated measles vaccine or an unknown type of measles vaccine during 1963-1967 should receive 2 doses of MMR vaccine. People who received inactivated mumps vaccine or an unknown type of mumps vaccine before 1979 and are at high risk for mumps infection should consider immunization with 2 doses of MMR vaccine. For females of childbearing age, rubella immunity should be determined. If there is no evidence of immunity, females who are not pregnant should be vaccinated. If there is no evidence of immunity, females who are pregnant should delay immunization until after pregnancy. Unvaccinated health care workers born before [birth year] who lack laboratory evidence of measles, mumps, or rubella immunity or laboratory confirmation of disease should consider measles and mumps immunization with 2 doses of MMR vaccine or rubella immunization with 1 dose of MMR vaccine.  Pneumococcal 13-valent conjugate (PCV13) vaccine. When indicated, a person who is uncertain of her immunization history and has no record of immunization should receive the PCV13 vaccine. An adult aged 77 years or older who has certain medical conditions and has not been previously immunized should receive 1 dose of  PCV13 vaccine. This PCV13 should be followed with a dose of pneumococcal polysaccharide (PPSV23) vaccine. The PPSV23 vaccine dose should be obtained at least 8 weeks after the dose of PCV13 vaccine. An adult aged 66 years or older who has certain medical conditions and previously received 1 or more doses of PPSV23 vaccine should receive 1 dose of PCV13. The PCV13 vaccine dose should be obtained 1 or more years after the last PPSV23 vaccine dose.  Pneumococcal polysaccharide (PPSV23) vaccine. When PCV13 is also indicated, PCV13 should be obtained first. All adults aged 41 years and older should be immunized. An adult younger than age 20 years who has certain medical conditions should be immunized. Any person who resides in a nursing home or long-term care facility should be immunized. An adult smoker should be immunized. People with an immunocompromised condition and certain other conditions should receive both PCV13 and PPSV23 vaccines. People with human immunodeficiency virus (HIV) infection should be immunized as soon as possible after diagnosis. Immunization during chemotherapy or radiation therapy should be avoided. Routine use of PPSV23 vaccine is not recommended for American Indians, [place] Natives, or people younger than 65 years unless there are medical conditions that require PPSV23 vaccine. When indicated, people who have unknown immunization and have no record of immunization should receive PPSV23 vaccine. One-time revaccination 5 years after the first dose of PPSV23 is recommended for people aged 19-64 years who have chronic kidney failure, nephrotic syndrome, asplenia, or immunocompromised conditions. People who received 1-2 doses of PPSV23 before age 33 years should receive another dose of PPSV23 vaccine at age 19 years or later if at least 5 years have passed since the previous dose. Doses of PPSV23 are not needed for people immunized with PPSV23 at or after age 15 years.  Meningococcal vaccine.  Adults with asplenia or persistent complement component deficiencies should receive 2 doses of quadrivalent meningococcal conjugate (MenACWY-D) vaccine. The doses should be obtained at least 2 months apart. Microbiologists working with certain meningococcal bacteria, [place] recruits, people at risk during an outbreak, and people who travel to or live in countries with a high rate of meningitis should be immunized. A first-year college student up through age 52 years who is living in a residence hall should receive a dose if she did not receive a dose on or after her 16th birthday. Adults who have certain high-risk conditions should receive one or more doses of vaccine.  Hepatitis A vaccine. Adults who wish to be protected from this disease, have certain high-risk conditions, work with hepatitis A-infected animals, work in hepatitis A research labs, or travel to or work in countries with a high rate of hepatitis A should be immunized. Adults who were previously unvaccinated and who anticipate close contact with an international adoptee during the first 60 days after arrival in the [place] from a country with a high rate of hepatitis A should be immunized.  Hepatitis B vaccine. Adults who wish to be protected from this disease, have certain high-risk conditions, may be exposed to blood or other infectious body fluids, are household contacts or sex partners of hepatitis B positive people, are clients or workers in certain care facilities, or travel to or work in countries with a high rate of hepatitis B should be immunized.  Haemophilus influenzae type b (Hib) vaccine. A previously unvaccinated person with asplenia or sickle cell disease or having a scheduled splenectomy should receive 1 dose of Hib vaccine. Regardless of previous immunization, a recipient of a hematopoietic stem  cell transplant should receive a 3-dose series 6-12 months after her successful transplant. Hib vaccine is not recommended for  adults with HIV infection. Preventive Services / Frequency  Ages 40 to 64 years  Blood pressure check.** / Every 1 to 2 years.  Lipid and cholesterol check.** / Every 5 years beginning at age 20 years.  Lung cancer screening. / Every year if you are aged 55-80 years and have a 30-pack-year history of smoking and currently smoke or have quit within the past 15 years. Yearly screening is stopped once you have quit smoking for at least 15 years or develop a health problem that would prevent you from having lung cancer treatment.  Clinical breast exam.** / Every year after age 40 years.  BRCA-related cancer risk assessment.** / For women who have family members with a BRCA-related cancer (breast, ovarian, tubal, or peritoneal cancers).  Mammogram.** / Every year beginning at age 40 years and continuing for as long as you are in good health. Consult with your health care provider.  Pap test.** / Every 3 years starting at age 30 years through age 65 or 70 years with a history of 3 consecutive normal Pap tests.  HPV screening.** / Every 3 years from ages 30 years through ages 65 to 70 years with a history of 3 consecutive normal Pap tests.  Fecal occult blood test (FOBT) of stool. / Every year beginning at age 50 years and continuing until age 75 years. You may not need to do this test if you get a colonoscopy every 10 years.  Flexible sigmoidoscopy or colonoscopy.** / Every 5 years for a flexible sigmoidoscopy or every 10 years for a colonoscopy beginning at age 50 years and continuing until age 75 years.  Hepatitis C blood test.** / For all people born from 1945 through 1965 and any individual with known risks for hepatitis C.  Skin self-exam. / Monthly.  Influenza vaccine. / Every year.  Tetanus, diphtheria, and acellular pertussis (Tdap/Td) vaccine.** / Consult your health care provider. Pregnant women should receive 1 dose of Tdap vaccine during each pregnancy. 1 dose of Td every 10  years.  Varicella vaccine.** / Consult your health care provider. Pregnant females who do not have evidence of immunity should receive the first dose after pregnancy.  Zoster vaccine.** / 1 dose for adults aged 60 years or older.  Measles, mumps, rubella (MMR) vaccine.** / You need at least 1 dose of MMR if you were born in 1957 or later. You may also need a 2nd dose. For females of childbearing age, rubella immunity should be determined. If there is no evidence of immunity, females who are not pregnant should be vaccinated. If there is no evidence of immunity, females who are pregnant should delay immunization until after pregnancy.  Pneumococcal 13-valent conjugate (PCV13) vaccine.** / Consult your health care provider.  Pneumococcal polysaccharide (PPSV23) vaccine.** / 1 to 2 doses if you smoke cigarettes or if you have certain conditions.  Meningococcal vaccine.** / Consult your health care provider.  Hepatitis A vaccine.** / Consult your health care provider.  Hepatitis B vaccine.** / Consult your health care provider.  Haemophilus influenzae type b (Hib) vaccine.** / Consult your health care provider.  

## 2013-09-30 NOTE — Progress Notes (Signed)
Patient ID: Vicki Hansen, female   DOB: January 02, 1956, 58 y.o.   MRN: [id number]  Annual Screening Comprehensive Examination  This very nice 58 y.o.MWF presents for complete physical.  Patient has been followed for HTN, T2_NIDDM  Prediabetes, Hyperlipidemia, and Vitamin D Deficiency.    HTN predates since 01/2008. Patient's BP has been controlled at home and patient denies any cardiac symptoms as chest pain, palpitations, shortness of breath, dizziness or ankle swelling. Today's    Patient's hyperlipidemia is controlled with diet and medications. Patient denies myalgias or other medication SE's. Last lipids were at goal -  Total Chol 139; HDL Chol 34*; LDL 52; Trig 264 on 12/25/2012.   Patient has prediabetes predating since Mar 2014 with an A1c of 5.8%. Patient denies reactive hypoglycemic symptoms, visual blurring, diabetic polys, or paresthesias. Last A1c was  5.6% on 12/25/2012.   Finally, patient has history of Vitamin D Deficiency and last Vitamin D was 12/25/2012: Vit D, 25-Hydroxy 82  Medication Sig  . CALCIUM PO Take 600 mg by mouth daily.  . cetirizine  10 MG tablet Take 10 mg by mouth daily.  Marland Kitchen VITAMIN D Take 4,000 Units by mouth daily.  . enalapril 20 MG tablet Take 20 mg by mouth daily.  Marland Kitchen FLAX SEED OIL Take 4,800 mg by mouth daily.   Marland Kitchen GLUCOSAMINE-CHONDROITIN PO Take  2 times daily. 1500 MG AND 1200 MG  DAILY  . MULTIVITAMIN Take 1 capsule by mouth daily.  . simvastatin  40 MG tablet TAKE 1/2 to 1 tab daily    No Known Allergies  Past Medical History  Diagnosis Date  . Hypertension   . Hyperlipidemia   . GERD (gastroesophageal reflux disease)   . Prediabetes   . Vitamin D deficiency   . Thyroiditis, subacute   . Iritis     HLA B27 +  . S/P ORIF (open reduction internal fixation) fracture 1994    Right tib/fib   Past Surgical History  Procedure Laterality Date  . Cesarean section    . Knee arthroscopy Left 1972  . Tonsillectomy and adenoidectomy    . Stapedectomy Left  2003   Family History  Problem Relation Age of Onset  . Cancer Mother     uterine, kidney  . Hypertension Mother   . Diabetes Father   . Hypertension Father   . Stroke Father   . Cancer Father     kidney   History  Substance Use Topics  . Smoking status: Never Smoker   . Smokeless tobacco: Never Used  . Alcohol Use: No    ROS Constitutional: Denies fever, chills, weight loss/gain, headaches, insomnia, fatigue, night sweats, and change in appetite. Eyes: Denies redness, blurred vision, diplopia, discharge, itchy, watery eyes.  ENT: Denies discharge, congestion, post nasal drip, epistaxis, sore throat, earache, hearing loss, dental pain, Tinnitus, Vertigo, Sinus pain, snoring.  Cardio: Denies chest pain, palpitations, irregular heartbeat, syncope, dyspnea, diaphoresis, orthopnea, PND, claudication, edema Respiratory: denies cough, dyspnea, DOE, pleurisy, hoarseness, laryngitis, wheezing.  Gastrointestinal: Denies dysphagia, heartburn, reflux, water brash, pain, cramps, nausea, vomiting, bloating, diarrhea, constipation, hematemesis, melena, hematochezia, jaundice, hemorrhoids Genitourinary: Denies dysuria, frequency, urgency, nocturia, hesitancy, discharge, hematuria, flank pain Breast: Breast lumps, nipple discharge, bleeding.  Musculoskeletal: Denies arthralgia, myalgia, stiffness, Jt. Swelling, pain, limp, and strain/sprain. Denies falls. Skin: Denies puritis, rash, hives, warts, acne, eczema, changing in skin lesion Neuro: No weakness, tremor, incoordination, spasms, paresthesia, pain Psychiatric: Denies confusion, memory loss, sensory loss. Denies Depression. Endocrine: Denies change in weight, skin,  hair change, nocturia, and paresthesia, diabetic polys, visual blurring, hyper / hypo glycemic episodes.  Heme/Lymph: No excessive bleeding, bruising, enlarged lymph nodes.  Physical Exam  BP 126/78  Pulse 80  Temp 97.5 F   Resp 16  Ht 5' 1.5"   Wt 123 lb 12.8 oz   BMI  23.02   General Appearance: Well nourished and in no apparent distress. Eyes: PERRLA, EOMs, conjunctiva no swelling or erythema, normal fundi and vessels. Sinuses: No frontal/maxillary tenderness ENT/Mouth: EACs patent / TMs  nl. Nares clear without erythema, swelling, mucoid exudates. Oral hygiene is good. No erythema, swelling, or exudate. Tongue normal, non-obstructing. Tonsils not swollen or erythematous. Hearing normal.  Neck: Supple, thyroid normal. No bruits, nodes or JVD. Respiratory: Respiratory effort normal.  BS equal and clear bilateral without rales, rhonci, wheezing or stridor. Cardio: Heart sounds are normal with regular rate and rhythm and no murmurs, rubs or gallops. Peripheral pulses are normal and equal bilaterally without edema. No aortic or femoral bruits. Chest: symmetric with normal excursions and percussion. Breasts: Symmetric, without lumps, nipple discharge, retractions, or fibrocystic changes.  Abdomen: Flat, soft, with bowl sounds. Nontender, no guarding, rebound, hernias, masses, or organomegaly.  Lymphatics: Non tender without lymphadenopathy.  Genitourinary:  Musculoskeletal: Full ROM all peripheral extremities, joint stability, 5/5 strength, and normal gait. Skin: Warm and dry without rashes, lesions, cyanosis, clubbing or  ecchymosis.  Neuro: Cranial nerves intact, reflexes equal bilaterally. Normal muscle tone, no cerebellar symptoms. Sensation intact.  Pysch: Awake and oriented X 3, normal affect, Insight and Judgment appropriate.   Assessment and Plan  1. Annual Screening Examination 2. Hypertension  3. Hyperlipidemia 4. Pre Diabetes 5. Vitamin D Deficiency  Continue prudent diet as discussed, weight control, BP monitoring, regular exercise, and medications. Discussed med's effects and SE's. Screening labs and tests as requested with regular follow-up as recommended.

## 2013-10-01 LAB — BASIC METABOLIC PANEL WITH GFR
BUN: 16 mg/dL (ref 6–23)
CO2: 25 mEq/L (ref 19–32)
Calcium: 9.8 mg/dL (ref 8.4–10.5)
Chloride: 104 mEq/L (ref 96–112)
Creat: 0.65 mg/dL (ref 0.50–1.10)
Glucose, Bld: 112 mg/dL — ABNORMAL HIGH (ref 70–99)
POTASSIUM: 3.8 meq/L (ref 3.5–5.3)
SODIUM: 139 meq/L (ref 135–145)

## 2013-10-01 LAB — HEPATIC FUNCTION PANEL
ALK PHOS: 98 U/L (ref 39–117)
ALT: 12 U/L (ref 0–35)
AST: 15 U/L (ref 0–37)
Albumin: 4.6 g/dL (ref 3.5–5.2)
BILIRUBIN TOTAL: 0.2 mg/dL (ref 0.2–1.2)
Total Protein: 7.2 g/dL (ref 6.0–8.3)

## 2013-10-01 LAB — VITAMIN D 25 HYDROXY (VIT D DEFICIENCY, FRACTURES): Vit D, 25-Hydroxy: 104 ng/mL — ABNORMAL HIGH (ref 30–89)

## 2013-10-01 LAB — LIPID PANEL
CHOL/HDL RATIO: 3.4 ratio
CHOLESTEROL: 127 mg/dL (ref 0–200)
HDL: 37 mg/dL — AB (ref >39)
LDL CALC: 45 mg/dL (ref 0–99)
TRIGLYCERIDES: 223 mg/dL — AB (ref <150)
VLDL: 45 mg/dL — AB (ref 0–40)

## 2013-10-01 LAB — MICROALBUMIN / CREATININE URINE RATIO
CREATININE, URINE: 21.2 mg/dL
MICROALB UR: 0.5 mg/dL (ref 0.00–1.89)
Microalb Creat Ratio: 23.6 mg/g (ref 0.0–30.0)

## 2013-10-01 LAB — IRON AND TIBC
%SAT: 26 % (ref 20–55)
%SAT: 25 % (ref 20–55)
IRON: 89 ug/dL (ref 42–145)
Iron: 88 ug/dL (ref 42–145)
TIBC: 338 ug/dL (ref 250–470)
TIBC: 347 ug/dL (ref 250–470)
UIBC: 249 ug/dL (ref 125–400)
UIBC: 259 ug/dL (ref 125–400)

## 2013-10-01 LAB — HEPATITIS C ANTIBODY: HCV AB: NEGATIVE

## 2013-10-01 LAB — URINALYSIS, MICROSCOPIC ONLY
Bacteria, UA: NONE SEEN
Casts: NONE SEEN
Crystals: NONE SEEN
Squamous Epithelial / LPF: NONE SEEN

## 2013-10-01 LAB — HEMOGLOBIN A1C
Hgb A1c MFr Bld: 5.8 % — ABNORMAL HIGH (ref <5.7)
Mean Plasma Glucose: 120 mg/dL — ABNORMAL HIGH (ref <117)

## 2013-10-01 LAB — HIV ANTIBODY (ROUTINE TESTING W REFLEX): HIV: NONREACTIVE

## 2013-10-01 LAB — HEPATITIS A ANTIBODY, TOTAL: HEP A TOTAL AB: NONREACTIVE

## 2013-10-01 LAB — MAGNESIUM: Magnesium: 2.3 mg/dL (ref 1.5–2.5)

## 2013-10-01 LAB — TSH: TSH: 1.132 u[IU]/mL (ref 0.350–4.500)

## 2013-10-01 LAB — HEPATITIS B CORE ANTIBODY, TOTAL: Hep B Core Total Ab: NONREACTIVE

## 2013-10-01 LAB — VITAMIN B12: Vitamin B-12: 583 pg/mL (ref 211–911)

## 2013-10-01 LAB — RPR

## 2013-10-01 LAB — INSULIN, FASTING: Insulin fasting, serum: 21.2 u[IU]/mL — ABNORMAL HIGH (ref 2.0–19.6)

## 2013-10-01 LAB — HEPATITIS B SURFACE ANTIBODY,QUALITATIVE: HEP B S AB: NEGATIVE

## 2013-10-02 LAB — HEPATITIS B E ANTIBODY: Hepatitis Be Antibody: REACTIVE — AB

## 2013-10-03 LAB — TB SKIN TEST
INDURATION: 0 mm
TB SKIN TEST: NEGATIVE

## 2013-10-20 ENCOUNTER — Other Ambulatory Visit (INDEPENDENT_AMBULATORY_CARE_PROVIDER_SITE_OTHER): Payer: BC Managed Care – PPO | Admitting: *Deleted

## 2013-10-20 DIAGNOSIS — Z1212 Encounter for screening for malignant neoplasm of rectum: Secondary | ICD-10-CM

## 2013-10-20 LAB — POC HEMOCCULT BLD/STL (HOME/3-CARD/SCREEN)
Card #2 Fecal Occult Blod, POC: NEGATIVE
FECAL OCCULT BLD: NEGATIVE
Fecal Occult Blood, POC: NEGATIVE

## 2013-12-16 ENCOUNTER — Other Ambulatory Visit: Payer: Self-pay | Admitting: Internal Medicine

## 2013-12-16 DIAGNOSIS — I10 Essential (primary) hypertension: Secondary | ICD-10-CM

## 2013-12-16 MED ORDER — ENALAPRIL MALEATE 20 MG PO TABS: 20.0000 mg | ORAL_TABLET | Freq: Every day | ORAL | Status: DC

## 2014-01-22 ENCOUNTER — Ambulatory Visit: Payer: Self-pay | Admitting: Physician Assistant

## 2014-04-24 ENCOUNTER — Encounter: Payer: Self-pay | Admitting: Internal Medicine

## 2014-04-24 ENCOUNTER — Ambulatory Visit (INDEPENDENT_AMBULATORY_CARE_PROVIDER_SITE_OTHER): Payer: BLUE CROSS/BLUE SHIELD | Admitting: Internal Medicine

## 2014-04-24 VITALS — BP 122/72 | HR 72 | Temp 98.1°F | Resp 16 | Ht 61.5 in | Wt 127.0 lb

## 2014-04-24 DIAGNOSIS — E785 Hyperlipidemia, unspecified: Secondary | ICD-10-CM

## 2014-04-24 DIAGNOSIS — I10 Essential (primary) hypertension: Secondary | ICD-10-CM

## 2014-04-24 DIAGNOSIS — E559 Vitamin D deficiency, unspecified: Secondary | ICD-10-CM

## 2014-04-24 DIAGNOSIS — R5383 Other fatigue: Secondary | ICD-10-CM

## 2014-04-24 DIAGNOSIS — R7309 Other abnormal glucose: Secondary | ICD-10-CM

## 2014-04-24 DIAGNOSIS — Z79899 Other long term (current) drug therapy: Secondary | ICD-10-CM

## 2014-04-24 DIAGNOSIS — R7303 Prediabetes: Secondary | ICD-10-CM

## 2014-04-24 LAB — CBC WITH DIFFERENTIAL/PLATELET
BASOS ABS: 0 10*3/uL (ref 0.0–0.1)
BASOS PCT: 0 % (ref 0–1)
Eosinophils Absolute: 0.1 10*3/uL (ref 0.0–0.7)
Eosinophils Relative: 1 % (ref 0–5)
HCT: 39.8 % (ref 36.0–46.0)
Hemoglobin: 13.4 g/dL (ref 12.0–15.0)
Lymphocytes Relative: 25 % (ref 12–46)
Lymphs Abs: 1.8 10*3/uL (ref 0.7–4.0)
MCH: 29.6 pg (ref 26.0–34.0)
MCHC: 33.7 g/dL (ref 30.0–36.0)
MCV: 88.1 fL (ref 78.0–100.0)
MPV: 9.4 fL (ref 8.6–12.4)
Monocytes Absolute: 0.4 10*3/uL (ref 0.1–1.0)
Monocytes Relative: 5 % (ref 3–12)
Neutro Abs: 4.9 10*3/uL (ref 1.7–7.7)
Neutrophils Relative %: 69 % (ref 43–77)
Platelets: 267 10*3/uL (ref 150–400)
RBC: 4.52 MIL/uL (ref 3.87–5.11)
RDW: 13.2 % (ref 11.5–15.5)
WBC: 7.1 10*3/uL (ref 4.0–10.5)

## 2014-04-24 LAB — TSH: TSH: 1.144 u[IU]/mL (ref 0.350–4.500)

## 2014-04-24 LAB — LIPID PANEL
Cholesterol: 136 mg/dL (ref 0–200)
HDL: 33 mg/dL — ABNORMAL LOW (ref >=46)
LDL CALC: 64 mg/dL (ref 0–99)
Total CHOL/HDL Ratio: 4.1 Ratio
Triglycerides: 197 mg/dL — ABNORMAL HIGH (ref <150)
VLDL: 39 mg/dL (ref 0–40)

## 2014-04-24 LAB — BASIC METABOLIC PANEL WITH GFR
BUN: 17 mg/dL (ref 6–23)
CO2: 27 mEq/L (ref 19–32)
CREATININE: 0.65 mg/dL (ref 0.50–1.10)
Calcium: 9.8 mg/dL (ref 8.4–10.5)
Chloride: 105 mEq/L (ref 96–112)
Glucose, Bld: 95 mg/dL (ref 70–99)
POTASSIUM: 4.4 meq/L (ref 3.5–5.3)
Sodium: 139 mEq/L (ref 135–145)

## 2014-04-24 LAB — HEPATIC FUNCTION PANEL
ALT: 14 U/L (ref 0–35)
AST: 16 U/L (ref 0–37)
Albumin: 4.6 g/dL (ref 3.5–5.2)
Alkaline Phosphatase: 90 U/L (ref 39–117)
Bilirubin, Direct: 0.1 mg/dL (ref 0.0–0.3)
Indirect Bilirubin: 0.3 mg/dL (ref 0.2–1.2)
Total Bilirubin: 0.4 mg/dL (ref 0.2–1.2)
Total Protein: 7.2 g/dL (ref 6.0–8.3)

## 2014-04-24 LAB — IRON AND TIBC
%SAT: 31 % (ref 20–55)
IRON: 116 ug/dL (ref 42–145)
TIBC: 379 ug/dL (ref 250–470)
UIBC: 263 ug/dL (ref 125–400)

## 2014-04-24 LAB — MAGNESIUM: Magnesium: 2.1 mg/dL (ref 1.5–2.5)

## 2014-04-24 NOTE — Patient Instructions (Signed)
   Recommend the book "The END of DIETING" by Dr Excell Seltzer   & the book "The END of DIABETES " by Dr Excell Seltzer  At [email or web address] - get book & Audio CD's      Being diabetic has a  300% increased risk for heart attack, stroke, cancer, and alzheimer- type vascular dementia. It is very important that you work harder with diet by avoiding all foods that are white. Avoid white rice (brown & wild rice is OK), white potatoes (sweetpotatoes in moderation is OK), White bread or wheat bread or anything made out of white flour like bagels, donuts, rolls, buns, biscuits, cakes, pastries, cookies, pizza crust, and pasta (made from white flour & egg whites) - vegetarian pasta or spinach or wheat pasta is OK. Multigrain breads like Arnold's or Pepperidge [employer], or multigrain sandwich thins or flatbreads.  Diet, exercise and weight loss can reverse and cure diabetes in the early stages.  Diet, exercise and weight loss is very important in the control and prevention of complications of diabetes which affects every system in your body, ie. Brain - dementia/stroke, eyes - glaucoma/blindness, heart - heart attack/heart failure, kidneys - dialysis, stomach - gastric paralysis, intestines - malabsorption, nerves - severe painful neuritis, circulation - gangrene & loss of a leg(s), and finally cancer and Alzheimers.    I recommend avoid fried & greasy foods,  sweets/candy, white rice (brown or wild rice or Quinoa is OK), white potatoes (sweet potatoes are OK) - anything made from white flour - bagels, doughnuts, rolls, buns, biscuits,white and wheat breads, pizza crust and traditional pasta made of white flour & egg white(vegetarian pasta or spinach or wheat pasta is OK).  Multi-grain bread is OK - like multi-grain flat bread or sandwich thins. Avoid alcohol in excess. Exercise is also important.    Eat all the vegetables you want - avoid meat, especially red meat and dairy - especially cheese.  Cheese is the most  concentrated form of trans-fats which is the worst thing to clog up our arteries. Veggie cheese is OK which can be found in the fresh produce section at [facility] or [employer] or [employer]

## 2014-04-25 LAB — INSULIN, RANDOM: Insulin: 13.5 u[IU]/mL (ref 2.0–19.6)

## 2014-04-25 LAB — HEMOGLOBIN A1C
Hgb A1c MFr Bld: 5.8 % — ABNORMAL HIGH (ref <5.7)
Mean Plasma Glucose: 120 mg/dL — ABNORMAL HIGH (ref <117)

## 2014-04-25 LAB — VITAMIN D 25 HYDROXY (VIT D DEFICIENCY, FRACTURES): VIT D 25 HYDROXY: 81 ng/mL (ref 30–100)

## 2014-04-26 ENCOUNTER — Encounter: Payer: Self-pay | Admitting: Internal Medicine

## 2014-04-26 NOTE — Progress Notes (Signed)
Patient ID: Vicki Hansen, female   DOB: 1955-06-23, 59 y.o.   MRN: [id number]   This very nice 59 y.o. MWF presents for 3 month follow up with Hypertension, Hyperlipidemia, Pre-Diabetes and Vitamin D Deficiency.    Patient is treated for HTN since Jan 2010 & BP has been controlled at home. Today's BP: 122/72 mmHg. Patient has had no complaints of any cardiac type chest pain, palpitations, dyspnea/orthopnea/PND, dizziness, claudication, or dependent edema.   Hyperlipidemia is controlled with diet & meds. Patient denies myalgias or other med SE's. Last Lipids were at goal - Total Chol 136; HDL 33; LDL 64; and sl elevated Trig 197 on 04/24/2014:   Also, the patient has history of PreDiabetes with A1c 5.8% in Mar 2014 and has had no symptoms of reactive hypoglycemia, diabetic polys, paresthesias or visual blurring.  Last A1c was  5.8% on  04/24/2014.   Further, the patient also has history of Vitamin D Deficiency and supplements vitamin D without any suspected side-effects. Last vitamin D was 81 on 04/24/2014.  Medication Sig  . CALCIUM  Take 600 mg by mouth daily.  . cetirizine  10 MG tablet Take 10 mg by mouth daily.  . enalapril  20 MG tablet Take 1 tablet (20 mg total) by mouth daily.  Marland Kitchen FLAX SEED OIL  Take 4,800 mg by mouth daily.   Marland Kitchen GLUCOSAMINE-CHONDROITIN Take by mouth 2 (two) times daily. 1500 MG AND 1200 MG DOSES DAILY  . MULTIVITAMIN Take 1 capsule by mouth daily.  . simvastatin  40 MG tablet TAKE ONE-HALF OR ONE TABLET BY MOUTH DAILY AT BEDTIME FOR CHOLESTEROL  . VITAMIN D 1000 UNITS  Take 4,000 Units by mouth daily.   No Known Allergies  PMHx:   Past Medical History  Diagnosis Date  . Hypertension   . Hyperlipidemia   . GERD (gastroesophageal reflux disease)   . Prediabetes   . Vitamin D deficiency   . Thyroiditis, subacute   . Iritis     HLA B27 +  . S/P ORIF (open reduction internal fixation) fracture 1994    Right tib/fib   Immunization History  Administered Date(s)  Administered  . PPD Test 09/30/2013  . Pneumococcal-Unspecified 08/27/2008  . Tdap 07/18/2007   Past Surgical History  Procedure Laterality Date  . Cesarean section    . Knee arthroscopy Left 1972  . Tonsillectomy and adenoidectomy    . Stapedectomy Left 2003   FHx:    Reviewed / unchanged  SHx:    Reviewed / unchanged  Systems Review:  Constitutional: Denies fever, chills, wt changes, headaches, insomnia, fatigue, night sweats, change in appetite. Eyes: Denies redness, blurred vision, diplopia, discharge, itchy, watery eyes.  ENT: Denies discharge, congestion, post nasal drip, epistaxis, sore throat, earache, hearing loss, dental pain, tinnitus, vertigo, sinus pain, snoring.  CV: Denies chest pain, palpitations, irregular heartbeat, syncope, dyspnea, diaphoresis, orthopnea, PND, claudication or edema. Respiratory: denies cough, dyspnea, DOE, pleurisy, hoarseness, laryngitis, wheezing.  Gastrointestinal: Denies dysphagia, odynophagia, heartburn, reflux, water brash, abdominal pain or cramps, nausea, vomiting, bloating, diarrhea, constipation, hematemesis, melena, hematochezia  or hemorrhoids. Genitourinary: Denies dysuria, frequency, urgency, nocturia, hesitancy, discharge, hematuria or flank pain. Musculoskeletal: Denies arthralgias, myalgias, stiffness, jt. swelling, pain, limping or strain/sprain.  Skin: Denies pruritus, rash, hives, warts, acne, eczema or change in skin lesion(s). Neuro: No weakness, tremor, incoordination, spasms, paresthesia or pain. Psychiatric: Denies confusion, memory loss or sensory loss. Endo: Denies change in weight, skin or hair change.  Heme/Lymph: No excessive bleeding, bruising  or enlarged lymph nodes.  Physical Exam  BP 122/72   Pulse 72  Temp 98.1 F   Resp 16  Ht 5' 1.5"  Wt 127 lb    BMI 23.61  Appears well nourished and in no distress. Eyes: PERRLA, EOMs, conjunctiva no swelling or erythema. Sinuses: No frontal/maxillary  tenderness ENT/Mouth: EAC's clear, TM's nl w/o erythema, bulging. Nares clear w/o erythema, swelling, exudates. Oropharynx clear without erythema or exudates. Oral hygiene is good. Tongue normal, non obstructing. Hearing intact.  Neck: Supple. Thyroid nl. Car 2+/2+ without bruits, nodes or JVD. Chest: Respirations nl with BS clear & equal w/o rales, rhonchi, wheezing or stridor.  Cor: Heart sounds normal w/ regular rate and rhythm without sig. murmurs, gallops, clicks, or rubs. Peripheral pulses normal and equal  without edema.  Abdomen: Soft & bowel sounds normal. Non-tender w/o guarding, rebound, hernias, masses, or organomegaly.  Lymphatics: Unremarkable.  Musculoskeletal: Full ROM all peripheral extremities, joint stability, 5/5 strength, and normal gait.  Skin: Warm, dry without exposed rashes, lesions or ecchymosis apparent.  Neuro: Cranial nerves intact, reflexes equal bilaterally. Sensory-motor testing grossly intact. Tendon reflexes grossly intact.  Pysch: Alert & oriented x 3.  Insight and judgement nl & appropriate. No ideations.  Assessment and Plan:  1. Essential hypertension  - TSH  2. Hyperlipidemia  - Lipid panel  3. Prediabetes  - Hemoglobin A1c - Insulin, random  4. Vitamin D deficiency  - Vit D  25 hydroxy (rtn osteoporosis monitoring)  5. Other fatigue  - Iron and TIBC  6. Medication management  - CBC with Differential/Platelet - BASIC METABOLIC PANEL WITH GFR - Hepatic function panel - Magnesium   Recommended regular exercise, BP monitoring, weight control, and discussed med and SE's. Recommended labs to assess and monitor clinical status. Further disposition pending results of labs. Over 30 minutes of exam, counseling, chart review was performed

## 2014-06-08 ENCOUNTER — Other Ambulatory Visit: Payer: Self-pay | Admitting: Internal Medicine

## 2014-06-09 ENCOUNTER — Other Ambulatory Visit: Payer: Self-pay | Admitting: Internal Medicine

## 2014-06-11 ENCOUNTER — Other Ambulatory Visit: Payer: Self-pay | Admitting: Internal Medicine

## 2014-07-28 ENCOUNTER — Other Ambulatory Visit: Payer: Self-pay | Admitting: *Deleted

## 2014-07-28 MED ORDER — SIMVASTATIN 40 MG PO TABS: ORAL_TABLET | ORAL | Status: DC

## 2014-07-30 ENCOUNTER — Other Ambulatory Visit: Payer: Self-pay | Admitting: Internal Medicine

## 2014-07-31 ENCOUNTER — Other Ambulatory Visit: Payer: Self-pay

## 2014-07-31 MED ORDER — SIMVASTATIN 40 MG PO TABS: ORAL_TABLET | ORAL | Status: DC

## 2014-10-05 ENCOUNTER — Encounter: Payer: Self-pay | Admitting: Internal Medicine

## 2014-11-25 ENCOUNTER — Encounter: Payer: Self-pay | Admitting: Physician Assistant

## 2014-11-25 ENCOUNTER — Ambulatory Visit (INDEPENDENT_AMBULATORY_CARE_PROVIDER_SITE_OTHER): Payer: BLUE CROSS/BLUE SHIELD | Admitting: Physician Assistant

## 2014-11-25 VITALS — BP 130/64 | HR 84 | Temp 97.7°F | Resp 16 | Ht 61.0 in | Wt 125.0 lb

## 2014-11-25 DIAGNOSIS — Z0001 Encounter for general adult medical examination with abnormal findings: Secondary | ICD-10-CM | POA: Diagnosis not present

## 2014-11-25 DIAGNOSIS — R6889 Other general symptoms and signs: Secondary | ICD-10-CM | POA: Diagnosis not present

## 2014-11-25 DIAGNOSIS — E559 Vitamin D deficiency, unspecified: Secondary | ICD-10-CM | POA: Diagnosis not present

## 2014-11-25 DIAGNOSIS — H6122 Impacted cerumen, left ear: Secondary | ICD-10-CM

## 2014-11-25 DIAGNOSIS — D649 Anemia, unspecified: Secondary | ICD-10-CM

## 2014-11-25 DIAGNOSIS — E785 Hyperlipidemia, unspecified: Secondary | ICD-10-CM

## 2014-11-25 DIAGNOSIS — I10 Essential (primary) hypertension: Secondary | ICD-10-CM | POA: Diagnosis not present

## 2014-11-25 DIAGNOSIS — Z79899 Other long term (current) drug therapy: Secondary | ICD-10-CM | POA: Diagnosis not present

## 2014-11-25 DIAGNOSIS — R7303 Prediabetes: Secondary | ICD-10-CM

## 2014-11-25 LAB — HEMOGLOBIN A1C
HEMOGLOBIN A1C: 5.7 % — AB (ref <5.7)
Mean Plasma Glucose: 117 mg/dL — ABNORMAL HIGH (ref <117)

## 2014-11-25 LAB — CBC WITH DIFFERENTIAL/PLATELET
BASOS PCT: 0 % (ref 0–1)
Basophils Absolute: 0 10*3/uL (ref 0.0–0.1)
EOS ABS: 0.1 10*3/uL (ref 0.0–0.7)
EOS PCT: 1 % (ref 0–5)
HCT: 39.4 % (ref 36.0–46.0)
Hemoglobin: 13.1 g/dL (ref 12.0–15.0)
Lymphocytes Relative: 22 % (ref 12–46)
Lymphs Abs: 1.6 10*3/uL (ref 0.7–4.0)
MCH: 30.5 pg (ref 26.0–34.0)
MCHC: 33.2 g/dL (ref 30.0–36.0)
MCV: 91.8 fL (ref 78.0–100.0)
MONOS PCT: 6 % (ref 3–12)
MPV: 9 fL (ref 8.6–12.4)
Monocytes Absolute: 0.4 10*3/uL (ref 0.1–1.0)
NEUTROS PCT: 71 % (ref 43–77)
Neutro Abs: 5.2 10*3/uL (ref 1.7–7.7)
PLATELETS: 262 10*3/uL (ref 150–400)
RBC: 4.29 MIL/uL (ref 3.87–5.11)
RDW: 13 % (ref 11.5–15.5)
WBC: 7.3 10*3/uL (ref 4.0–10.5)

## 2014-11-25 NOTE — Progress Notes (Signed)
Complete Physical  Assessment and Plan: 1. Essential hypertension - continue medications, DASH diet, exercise and monitor at home. Call if greater than 130/80.  - CBC with Differential/Platelet - BASIC METABOLIC PANEL WITH GFR - Hepatic function panel - TSH - Urinalysis, Routine w reflex microscopic (not at [facility]) - Microalbumin / creatinine urine ratio - EKG 12-Lead - [place], RETROPERITNL ABD,  LTD  2. Prediabetes Discussed general issues about diabetes pathophysiology and management., Educational material distributed., Suggested low cholesterol diet., Encouraged aerobic exercise., Discussed foot care., Reminded to get yearly retinal exam. - Hemoglobin A1c - Insulin, fasting  3. Hyperlipidemia -continue medications, check lipids, decrease fatty foods, increase activity.  - Lipid panel  4. Vitamin D deficiency - Vit D  25 hydroxy (rtn osteoporosis monitoring)  5. Medication management - Magnesium  6. Left ear impacted cerumen Cerumen impaction- stop using Qtips, irrigation used in the office without complications, use OTC drops/oil at home to prevent reoccurence  7. Encounter for general adult medical examination with abnormal findings - CBC with Differential/Platelet - BASIC METABOLIC PANEL WITH GFR - Hepatic function panel - TSH - Lipid panel - Hemoglobin A1c - Insulin, fasting - Magnesium - Vit D  25 hydroxy (rtn osteoporosis monitoring) - Urinalysis, Routine w reflex microscopic (not at [facility]) - Microalbumin / creatinine urine ratio - Iron and TIBC - Ferritin - Vitamin B12 - EKG 12-Lead - [place], RETROPERITNL ABD,  LTD  8. Anemia, unspecified anemia type - Iron and TIBC - Ferritin - Vitamin B12  Discussed med's effects and SE's. Screening labs and tests as requested with regular follow-up as recommended. Over 40 minutes of exam, counseling, chart review, and complex, high level critical decision making was performed this visit.   HPI  59 y.o. female  presents for  a complete physical.  Her blood pressure has been controlled at home, today their BP is BP: 130/64 mmHg She does workout. She denies chest pain, shortness of breath, dizziness.  She has not been exercising for the past month, her father passed, Chloe hatcher.  She is on cholesterol medication and denies myalgias. Her cholesterol is at goal. The cholesterol last visit was:   Lab Results  Component Value Date   CHOL 136 04/24/2014   HDL 33* 04/24/2014   LDLCALC 64 04/24/2014   TRIG 197* 04/24/2014   CHOLHDL 4.1 04/24/2014   She has been working on diet and exercise for prediabetes,  and denies paresthesia of the feet, polydipsia, polyuria and visual disturbances. Last A1C in the office was:  Lab Results  Component Value Date   HGBA1C 5.8* 04/24/2014  Patient is on Vitamin D supplement.   Lab Results  Component Value Date   VD25OH 15 04/24/2014      Current Medications:  Current Outpatient Prescriptions on File Prior to Visit  Medication Sig Dispense Refill  . BIOTIN PO Take 5,000 mg elemental calcium/kg/hr by mouth daily.    Marland Kitchen CALCIUM PO Take 600 mg by mouth daily.    . cetirizine (ZYRTEC) 10 MG tablet Take 10 mg by mouth daily.    . Cholecalciferol (VITAMIN D PO) Take 5,000 Int'l Units by mouth daily.    . enalapril (VASOTEC) 20 MG tablet TAKE ONE TABLET BY MOUTH ONCE DAILY 90 tablet 0  . Flaxseed, Linseed, (FLAX SEED OIL PO) Take 4,800 mg by mouth daily.     Marland Kitchen MAGNESIUM PO Take 250 mg by mouth daily.    . Multiple Vitamin (MULTIVITAMIN) capsule Take 1 capsule by mouth daily.    Marland Kitchen  simvastatin (ZOCOR) 40 MG tablet TAKE ONE-HALF OR ONE TABLET BY MOUTH DAILY AT BEDTIME FOR CHOLESTEROL 90 tablet 1   No current facility-administered medications on file prior to visit.   Health Maintenance:   Immunization History  Administered Date(s) Administered  . PPD Test 09/30/2013  . Pneumococcal-Unspecified 08/27/2008  . Tdap 07/18/2007   Immunization History  Administered Date(s)  Administered  . PPD Test 09/30/2013  . Pneumococcal-Unspecified 08/27/2008  . Tdap 07/18/2007    Tetanus: 2009 Pneumovax: 2010 Prevnar 13:  Flu vaccine: declines Zostavax: LMP: TAH Pap: TAH MGM: June 2016, at Dr. Hazle Coca  DEXA: will get Dec 9th Colonoscopy: 2009 Dr. Deatra Ina:  Last Dental Exam: None Last Eye Exam: Dr. Herbert Deaner, glasses, 11/24/2014 Patient Care Team: [name], MD as PCP - General (Internal Medicine) Inda Castle, MD as Consulting Physician (Gastroenterology)  Allergies: No Known Allergies Medical History:  Past Medical History  Diagnosis Date  . Hypertension   . Hyperlipidemia   . GERD (gastroesophageal reflux disease)   . Prediabetes   . Vitamin D deficiency   . Thyroiditis, subacute   . Iritis     HLA B27 +  . S/P ORIF (open reduction internal fixation) fracture 1994    Right tib/fib   Surgical History:  Past Surgical History  Procedure Laterality Date  . Cesarean section    . Knee arthroscopy Left 1972  . Tonsillectomy and adenoidectomy    . Stapedectomy Left 2003   Family History:  Family History  Problem Relation Age of Onset  . Cancer Mother     uterine, kidney  . Hypertension Mother   . Diabetes Father   . Hypertension Father   . Stroke Father   . Cancer Father     kidney   Social History:  Social History  Substance Use Topics  . Smoking status: Never Smoker   . Smokeless tobacco: Never Used  . Alcohol Use: No    Review of Systems: Review of Systems  Constitutional: Negative.   HENT: Negative.   Eyes: Negative.   Respiratory: Negative.   Cardiovascular: Negative.   Gastrointestinal: Negative.   Genitourinary: Negative.   Musculoskeletal: Negative.   Skin: Negative.   Neurological: Negative.   Endo/Heme/Allergies: Negative.   Psychiatric/Behavioral: Negative.     Physical Exam: Estimated body mass index is 23.63 kg/(m^2) as calculated from the following:   Height as of this encounter: 5' 1"  (1.549 m).    Weight as of this encounter: 125 lb (56.7 kg). BP 130/64 mmHg  Pulse 84  Temp(Src) 97.7 F (36.5 C) (Temporal)  Resp 16  Ht 5' 1"  (1.549 m)  Wt 125 lb (56.7 kg)  BMI 23.63 kg/m2  SpO2 97% General Appearance: Well nourished, in no apparent distress.  Eyes: PERRLA, EOMs, conjunctiva no swelling or erythema, normal fundi and vessels.  Sinuses: No Frontal/maxillary tenderness  ENT/Mouth: Ext aud canals clear, normal light reflex with TMs without erythema, bulging. Good dentition. No erythema, swelling, or exudate on post pharynx. Tonsils not swollen or erythematous. Hearing normal.  Neck: Supple, thyroid normal. No bruits  Respiratory: Respiratory effort normal, BS equal bilaterally without rales, rhonchi, wheezing or stridor.  Cardio: RRR without murmurs, rubs or gallops. Brisk peripheral pulses without edema.  Chest: symmetric, with normal excursions and percussion.  Breasts: defer OB/GYN  Abdomen: Soft, nontender, no guarding, rebound, hernias, masses, or organomegaly.  Lymphatics: Non tender without lymphadenopathy.  Genitourinary: defer OB/GYN Musculoskeletal: Full ROM all peripheral extremities,5/5 strength, and normal gait.  Skin: Warm, dry  without rashes, lesions, ecchymosis. Neuro: Cranial nerves intact, reflexes equal bilaterally. Normal muscle tone, no cerebellar symptoms. Sensation intact.  Psych: Awake and oriented X 3, normal affect, Insight and Judgment appropriate.   EKG: WNL no ST changes. AORTA SCAN: WNL   Vicie Mutters 10:59 AM [facility]

## 2014-11-26 LAB — BASIC METABOLIC PANEL WITH GFR
BUN: 15 mg/dL (ref 7–25)
CALCIUM: 9.6 mg/dL (ref 8.6–10.4)
CO2: 30 mmol/L (ref 20–31)
CREATININE: 0.58 mg/dL (ref 0.50–1.05)
Chloride: 105 mmol/L (ref 98–110)
GFR, Est African American: >89 mL/min (ref >=60)
GFR, Est Non African American: >89 mL/min (ref >=60)
Glucose, Bld: 92 mg/dL (ref 65–99)
Potassium: 4.1 mmol/L (ref 3.5–5.3)
Sodium: 140 mmol/L (ref 135–146)

## 2014-11-26 LAB — VITAMIN B12: VITAMIN B 12: 609 pg/mL (ref 211–911)

## 2014-11-26 LAB — IRON AND TIBC
%SAT: 34 % (ref 11–50)
Iron: 116 ug/dL (ref 45–160)
TIBC: 339 ug/dL (ref 250–450)
UIBC: 223 ug/dL (ref 125–400)

## 2014-11-26 LAB — LIPID PANEL
CHOL/HDL RATIO: 4.2 ratio (ref <=5.0)
CHOLESTEROL: 143 mg/dL (ref 125–200)
HDL: 34 mg/dL — ABNORMAL LOW (ref >=46)
LDL Cholesterol: 59 mg/dL (ref <130)
Triglycerides: 248 mg/dL — ABNORMAL HIGH (ref <150)
VLDL: 50 mg/dL — ABNORMAL HIGH (ref <30)

## 2014-11-26 LAB — HEPATIC FUNCTION PANEL
ALBUMIN: 4.3 g/dL (ref 3.6–5.1)
ALT: 17 U/L (ref 6–29)
AST: 18 U/L (ref 10–35)
Alkaline Phosphatase: 93 U/L (ref 33–130)
BILIRUBIN DIRECT: 0.1 mg/dL (ref <=0.2)
BILIRUBIN TOTAL: 0.4 mg/dL (ref 0.2–1.2)
Indirect Bilirubin: 0.3 mg/dL (ref 0.2–1.2)
Total Protein: 7.1 g/dL (ref 6.1–8.1)

## 2014-11-26 LAB — URINALYSIS, ROUTINE W REFLEX MICROSCOPIC
Bilirubin Urine: NEGATIVE
GLUCOSE, UA: NEGATIVE
HGB URINE DIPSTICK: NEGATIVE
Ketones, ur: NEGATIVE
LEUKOCYTES UA: NEGATIVE
Nitrite: NEGATIVE
PROTEIN: NEGATIVE
Specific Gravity, Urine: 1.008 (ref 1.001–1.035)
pH: 7 (ref 5.0–8.0)

## 2014-11-26 LAB — MICROALBUMIN / CREATININE URINE RATIO: CREATININE, URINE: 23 mg/dL (ref 20–320)

## 2014-11-26 LAB — TSH: TSH: 0.866 u[IU]/mL (ref 0.350–4.500)

## 2014-11-26 LAB — VITAMIN D 25 HYDROXY (VIT D DEFICIENCY, FRACTURES): VIT D 25 HYDROXY: 79 ng/mL (ref 30–100)

## 2014-11-26 LAB — INSULIN, FASTING: Insulin fasting, serum: 46.3 u[IU]/mL — ABNORMAL HIGH (ref 2.0–19.6)

## 2014-11-26 LAB — FERRITIN: FERRITIN: 72 ng/mL (ref 10–291)

## 2014-11-26 LAB — MAGNESIUM: MAGNESIUM: 2.2 mg/dL (ref 1.5–2.5)

## 2014-12-14 ENCOUNTER — Other Ambulatory Visit: Payer: Self-pay | Admitting: Internal Medicine

## 2015-03-08 ENCOUNTER — Other Ambulatory Visit: Payer: Self-pay | Admitting: Internal Medicine

## 2015-04-08 ENCOUNTER — Encounter: Payer: Self-pay | Admitting: Gastroenterology

## 2015-05-04 ENCOUNTER — Other Ambulatory Visit: Payer: Self-pay | Admitting: *Deleted

## 2015-07-22 ENCOUNTER — Other Ambulatory Visit: Payer: Self-pay | Admitting: Internal Medicine

## 2015-07-26 ENCOUNTER — Other Ambulatory Visit: Payer: Self-pay | Admitting: Internal Medicine

## 2015-09-16 ENCOUNTER — Other Ambulatory Visit: Payer: Self-pay | Admitting: Internal Medicine

## 2015-11-03 ENCOUNTER — Encounter: Payer: Self-pay | Admitting: Internal Medicine

## 2015-12-14 ENCOUNTER — Other Ambulatory Visit: Payer: Self-pay | Admitting: Internal Medicine

## 2015-12-28 NOTE — Progress Notes (Signed)
[facility] [name], M.D.    [name]. [name], P.A.-C      Starlyn Skeans, P.A.-C  [facility]                [address] [place], [place] [address] Telephone [phone] Telefax [phone]  Annual Screening/Preventative Visit & Comprehensive Evaluation &  Examination     This very nice 60 y.o. MWF presents for a Screening/Preventative Visit & comprehensive evaluation and management of multiple medical co-morbidities.  Patient has been followed for HTN, Prediabetes, Hyperlipidemia and Vitamin D Deficiency.      HTN predates circa 2010. Patient's BP has been controlled at home and patient denies any cardiac symptoms as chest pain, palpitations, shortness of breath, dizziness or ankle swelling. Today's BP is at goal - 120/66.      Patient's hyperlipidemia is controlled with diet and medications. Patient denies myalgias or other medication SE's. Last lipids were at goal albeit elevated Trig's: Lab Results  Component Value Date   CHOL 143 11/25/2014   HDL 34 (L) 11/25/2014   LDLCALC 59 11/25/2014   TRIG 248 (H) 11/25/2014   CHOLHDL 4.2 11/25/2014      Patient has prediabetes predating with A1c 5.8% since Mar 2014 and patient denies reactive hypoglycemic symptoms, visual blurring, diabetic polys, or paresthesias. Last A1c was near goal: Lab Results  Component Value Date   HGBA1C 5.7 (H) 11/25/2014      Finally, patient has history of Vitamin D Deficiency and last Vitamin D was at goal:  Lab Results  Component Value Date   VD25OH 79 11/25/2014   Current Outpatient Prescriptions on File Prior to Visit  Medication Sig  . BIOTIN PO Take 5,000 mg elemental calcium/kg/hr by mouth daily.  Marland Kitchen CALCIUM PO Take 600 mg by mouth daily.  . cetirizine (ZYRTEC) 10 MG tablet Take 10 mg by mouth daily.  . Cholecalciferol (VITAMIN D PO) Take 5,000 Int'l Units by mouth daily.  . enalapril (VASOTEC) 20 MG  tablet TAKE ONE TABLET BY MOUTH ONCE DAILY  . Flaxseed, Linseed, (FLAX SEED OIL PO) Take 4,800 mg by mouth daily.   Marland Kitchen MAGNESIUM PO Take 250 mg by mouth daily.  . Multiple Vitamin (MULTIVITAMIN) capsule Take 1 capsule by mouth daily.  . simvastatin (ZOCOR) 40 MG tablet TAKE ONE-HALF OR ONE TABLET BY MOUTH DAILY AT BEDTIME FOR CHOLESTEROL   No current facility-administered medications on file prior to visit.    No Known Allergies Past Medical History:  Diagnosis Date  . GERD (gastroesophageal reflux disease)   . Hyperlipidemia   . Hypertension   . Iritis    HLA B27 +  . Prediabetes   . S/P ORIF (open reduction internal fixation) fracture 1994   Right tib/fib  . Thyroiditis, subacute   . Vitamin D deficiency    Health Maintenance  Topic Date Due  . MAMMOGRAM  06/10/2009  . PAP SMEAR  12/23/2013  . INFLUENZA VACCINE  08/17/2015  . ZOSTAVAX  09/25/2015  . COLONOSCOPY  03/23/2017  . TETANUS/TDAP  07/17/2017  . Hepatitis C Screening  Completed  . HIV Screening  Completed   Immunization History  Administered Date(s) Administered  . PPD Test 09/30/2013  . Pneumococcal-Unspecified 08/27/2008  . Tdap 07/18/2007   Past Surgical History:  Procedure Laterality Date  . CESAREAN SECTION    . KNEE ARTHROSCOPY Left 1972  .  right leg repair  1994   rod   . STAPEDECTOMY Left 2003  . TONSILLECTOMY AND ADENOIDECTOMY     Family History  Problem Relation Age of Onset  . Cancer Mother     uterine, kidney  . Hypertension Mother   . Diabetes Father   . Hypertension Father   . Stroke Father   . Cancer Father     kidney   Social History  Substance Use Topics  . Smoking status: Never Smoker  . Smokeless tobacco: Never Used  . Alcohol use No    ROS Constitutional: Denies fever, chills, weight loss/gain, headaches, insomnia,  night sweats, and change in appetite. Does c/o fatigue. Eyes: Denies redness, blurred vision, diplopia, discharge, itchy, watery eyes.  ENT: Denies  discharge, congestion, post nasal drip, epistaxis, sore throat, earache, hearing loss, dental pain, Tinnitus, Vertigo, Sinus pain, snoring.  Cardio: Denies chest pain, palpitations, irregular heartbeat, syncope, dyspnea, diaphoresis, orthopnea, PND, claudication, edema Respiratory: denies cough, dyspnea, DOE, pleurisy, hoarseness, laryngitis, wheezing.  Gastrointestinal: Denies dysphagia, heartburn, reflux, water brash, pain, cramps, nausea, vomiting, bloating, diarrhea, constipation, hematemesis, melena, hematochezia, jaundice, hemorrhoids Genitourinary: Denies dysuria, frequency, urgency, nocturia, hesitancy, discharge, hematuria, flank pain Breast: Breast lumps, nipple discharge, bleeding.  Musculoskeletal: Denies arthralgia, myalgia, stiffness, Jt. Swelling, pain, limp, and strain/sprain. Denies falls. Skin: Denies puritis, rash, hives, warts, acne, eczema, changing in skin lesion Neuro: No weakness, tremor, incoordination, spasms, paresthesia, pain Psychiatric: Denies confusion, memory loss, sensory loss. Denies Depression. Endocrine: Denies change in weight, skin, hair change, nocturia, and paresthesia, diabetic polys, visual blurring, hyper / hypo glycemic episodes.  Heme/Lymph: No excessive bleeding, bruising, enlarged lymph nodes.  Physical Exam  BP 120/66   Pulse 76   Temp 97.1 F (36.2 C)   Ht 5' 2"  (1.575 m)   Wt 128 lb 6.4 oz (58.2 kg)   BMI 23.48 kg/m   General Appearance: Well nourished and in no apparent distress.  Eyes: PERRLA, EOMs, conjunctiva no swelling or erythema, normal fundi and vessels. Sinuses: No frontal/maxillary tenderness ENT/Mouth: EACs patent / TMs  nl. Nares clear without erythema, swelling, mucoid exudates. Oral hygiene is good. No erythema, swelling, or exudate. Tongue normal, non-obstructing. Tonsils not swollen or erythematous. Hearing normal.  Neck: Supple, thyroid normal. No bruits, nodes or JVD. Respiratory: Respiratory effort normal.  BS equal  and clear bilateral without rales, rhonci, wheezing or stridor. Cardio: Heart sounds are normal with regular rate and rhythm and no murmurs, rubs or gallops. Peripheral pulses are normal and equal bilaterally without edema. No aortic or femoral bruits. Chest: symmetric with normal excursions and percussion. Breasts: Symmetric, without lumps, nipple discharge, retractions, or fibrocystic changes.  Abdomen: Flat, soft with bowel sounds active. Nontender, no guarding, rebound, hernias, masses, or organomegaly.  Lymphatics: Non tender without lymphadenopathy.  Musculoskeletal: Full ROM all peripheral extremities, joint stability, 5/5 strength, and normal gait. Skin: Warm and dry without rashes, lesions, cyanosis, clubbing or  ecchymosis.  Neuro: Cranial nerves intact, reflexes equal bilaterally. Normal muscle tone, no cerebellar symptoms. Sensation intact.  Pysch: Alert and oriented X 3, normal affect, Insight and Judgment appropriate.   Assessment and Plan  1. Annual Preventative Screening Examination   2. Essential hypertension  - Microalbumin / creatinine urine ratio - EKG 12-Lead - [place], RETROPERITNL ABD,  LTD - Urinalysis, Routine w reflex microscopic - BASIC METABOLIC PANEL WITH GFR - TSH  3. Mixed hyperlipidemia  - EKG 12-Lead - [place], RETROPERITNL ABD,  LTD - Hepatic function panel - Lipid panel -  TSH  4. Prediabetes  - EKG 12-Lead - [place], RETROPERITNL ABD,  LTD - Hemoglobin A1c - Insulin, random  5. Vitamin D deficiency  - VITAMIN D 25 Hydroxy   6. Screening for ischemic heart disease  - EKG 12-Lead  7. Screening for AAA (aortic abdominal aneurysm)  - [place], RETROPERITNL ABD,  LTD  8. Other fatigue  - Vitamin B12 - Iron and TIBC - CBC with Differential/Platelet - TSH  9. Medication management  - Urinalysis, Routine w reflex microscopic - CBC with Differential/Platelet - BASIC METABOLIC PANEL WITH GFR - Magnesium  10. Screening for rectal cancer  - POC  Hemoccult Bld/Stl   11. Need for prophylactic vaccination and inoculation against varicella  - Varicella-zoster vaccine subcutaneous       Continue prudent diet as discussed, weight control, BP monitoring, regular exercise, and medications. Discussed med's effects and SE's. Screening labs and tests as requested with regular follow-up as recommended. Over 40 minutes of exam, counseling, chart review and high complex critical decision making was performed.

## 2015-12-28 NOTE — Patient Instructions (Signed)
Preventive Care for Adults A healthy lifestyle and preventive care can promote health and wellness. Preventive health guidelines for women include the following key practices.  A routine yearly physical is a good way to check with your health care provider about your health and preventive screening. It is a chance to share any concerns and updates on your health and to receive a thorough exam.  Visit your dentist for a routine exam and preventive care every 6 months. Brush your teeth twice a day and floss once a day. Good oral hygiene prevents tooth decay and gum disease.  The frequency of eye exams is based on your age, health, family medical history, use of contact lenses, and other factors. Follow your health care provider's recommendations for frequency of eye exams.  Eat a healthy diet. Foods like vegetables, fruits, whole grains, low-fat dairy products, and lean protein foods contain the nutrients you need without too many calories. Decrease your intake of foods high in solid fats, added sugars, and salt. Eat the right amount of calories for you.Get information about a proper diet from your health care provider, if necessary.  Regular physical exercise is one of the most important things you can do for your health. Most adults should get at least 150 minutes of moderate-intensity exercise (any activity that increases your heart rate and causes you to sweat) each week. In addition, most adults need muscle-strengthening exercises on 2 or more days a week.  Maintain a healthy weight. The body mass index (BMI) is a screening tool to identify possible weight problems. It provides an estimate of body fat based on height and weight. Your health care provider can find your BMI and can help you achieve or maintain a healthy weight.For adults 20 years and older:  A BMI below 18.5 is considered underweight.  A BMI of 18.5 to 24.9 is normal.  A BMI of 25 to 29.9 is considered overweight.  A BMI of  30 and above is considered obese.  Maintain normal blood lipids and cholesterol levels by exercising and minimizing your intake of saturated fat. Eat a balanced diet with plenty of fruit and vegetables. Blood tests for lipids and cholesterol should begin at age 76 and be repeated every 5 years. If your lipid or cholesterol levels are high, you are over 50, or you are at high risk for heart disease, you may need your cholesterol levels checked more frequently.Ongoing high lipid and cholesterol levels should be treated with medicines if diet and exercise are not working.  If you smoke, find out from your health care provider how to quit. If you do not use tobacco, do not start.  Lung cancer screening is recommended for adults aged 22-80 years who are at high risk for developing lung cancer because of a history of smoking. A yearly low-dose CT scan of the lungs is recommended for people who have at least a 30-pack-year history of smoking and are a current smoker or have quit within the past 15 years. A pack year of smoking is smoking an average of 1 pack of cigarettes a day for 1 year (for example: 1 pack a day for 30 years or 2 packs a day for 15 years). Yearly screening should continue until the smoker has stopped smoking for at least 15 years. Yearly screening should be stopped for people who develop a health problem that would prevent them from having lung cancer treatment.  If you are pregnant, do not drink alcohol. If you are breastfeeding,  be very cautious about drinking alcohol. If you are not pregnant and choose to drink alcohol, do not have more than 1 drink per day. One drink is considered to be 12 ounces (355 mL) of beer, 5 ounces (148 mL) of wine, or 1.5 ounces (44 mL) of liquor.  Avoid use of street drugs. Do not share needles with anyone. Ask for help if you need support or instructions about stopping the use of drugs.  High blood pressure causes heart disease and increases the risk of  stroke. Your blood pressure should be checked at least every 1 to 2 years. Ongoing high blood pressure should be treated with medicines if weight loss and exercise do not work.  If you are 75-52 years old, ask your health care provider if you should take aspirin to prevent strokes.  Diabetes screening involves taking a blood sample to check your fasting blood sugar level. This should be done once every 3 years, after age 15, if you are within normal weight and without risk factors for diabetes. Testing should be considered at a younger age or be carried out more frequently if you are overweight and have at least 1 risk factor for diabetes.  Breast cancer screening is essential preventive care for women. You should practice "breast self-awareness." This means understanding the normal appearance and feel of your breasts and may include breast self-examination. Any changes detected, no matter how small, should be reported to a health care provider. Women in their 58s and 30s should have a clinical breast exam (CBE) by a health care provider as part of a regular health exam every 1 to 3 years. After age 16, women should have a CBE every year. Starting at age 53, women should consider having a mammogram (breast X-ray test) every year. Women who have a family history of breast cancer should talk to their health care provider about genetic screening. Women at a high risk of breast cancer should talk to their health care providers about having an MRI and a mammogram every year.  Breast cancer gene (BRCA)-related cancer risk assessment is recommended for women who have family members with BRCA-related cancers. BRCA-related cancers include breast, ovarian, tubal, and peritoneal cancers. Having family members with these cancers may be associated with an increased risk for harmful changes (mutations) in the breast cancer genes BRCA1 and BRCA2. Results of the assessment will determine the need for genetic counseling and  BRCA1 and BRCA2 testing.  Routine pelvic exams to screen for cancer are no longer recommended for nonpregnant women who are considered low risk for cancer of the pelvic organs (ovaries, uterus, and vagina) and who do not have symptoms. Ask your health care provider if a screening pelvic exam is right for you.  If you have had past treatment for cervical cancer or a condition that could lead to cancer, you need Pap tests and screening for cancer for at least 20 years after your treatment. If Pap tests have been discontinued, your risk factors (such as having a new sexual partner) need to be reassessed to determine if screening should be resumed. Some women have medical problems that increase the chance of getting cervical cancer. In these cases, your health care provider may recommend more frequent screening and Pap tests.  The HPV test is an additional test that may be used for cervical cancer screening. The HPV test looks for the virus that can cause the cell changes on the cervix. The cells collected during the Pap test can be  tested for HPV. The HPV test could be used to screen women aged 30 years and older, and should be used in women of any age who have unclear Pap test results. After the age of 30, women should have HPV testing at the same frequency as a Pap test.  Colorectal cancer can be detected and often prevented. Most routine colorectal cancer screening begins at the age of 50 years and continues through age 75 years. However, your health care provider may recommend screening at an earlier age if you have risk factors for colon cancer. On a yearly basis, your health care provider may provide home test kits to check for hidden blood in the stool. Use of a small camera at the end of a tube, to directly examine the colon (sigmoidoscopy or colonoscopy), can detect the earliest forms of colorectal cancer. Talk to your health care provider about this at age 50, when routine screening begins. Direct  exam of the colon should be repeated every 5-10 years through age 75 years, unless early forms of pre-cancerous polyps or small growths are found.  People who are at an increased risk for hepatitis B should be screened for this virus. You are considered at high risk for hepatitis B if:  You were born in a country where hepatitis B occurs often. Talk with your health care provider about which countries are considered high risk.  Your parents were born in a high-risk country and you have not received a shot to protect against hepatitis B (hepatitis B vaccine).  You have HIV or AIDS.  You use needles to inject street drugs.  You live with, or have sex with, someone who has hepatitis B.  You get hemodialysis treatment.  You take certain medicines for conditions like cancer, organ transplantation, and autoimmune conditions.  Hepatitis C blood testing is recommended for all people born from 1945 through 1965 and any individual with known risks for hepatitis C.  Practice safe sex. Use condoms and avoid high-risk sexual practices to reduce the spread of sexually transmitted infections (STIs). STIs include gonorrhea, chlamydia, syphilis, trichomonas, herpes, HPV, and human immunodeficiency virus (HIV). Herpes, HIV, and HPV are viral illnesses that have no cure. They can result in disability, cancer, and death.  You should be screened for sexually transmitted illnesses (STIs) including gonorrhea and chlamydia if:  You are sexually active and are younger than 24 years.  You are older than 24 years and your health care provider tells you that you are at risk for this type of infection.  Your sexual activity has changed since you were last screened and you are at an increased risk for chlamydia or gonorrhea. Ask your health care provider if you are at risk.  If you are at risk of being infected with HIV, it is recommended that you take a prescription medicine daily to prevent HIV infection. This is  called preexposure prophylaxis (PrEP). You are considered at risk if:  You are a heterosexual woman, are sexually active, and are at increased risk for HIV infection.  You take drugs by injection.  You are sexually active with a partner who has HIV.  Talk with your health care provider about whether you are at high risk of being infected with HIV. If you choose to begin PrEP, you should first be tested for HIV. You should then be tested every 3 months for as long as you are taking PrEP.  Osteoporosis is a disease in which the bones lose minerals and strength   with aging. This can result in serious bone fractures or breaks. The risk of osteoporosis can be identified using a bone density scan. Women ages 65 years and over and women at risk for fractures or osteoporosis should discuss screening with their health care providers. Ask your health care provider whether you should take a calcium supplement or vitamin D to reduce the rate of osteoporosis.  Menopause can be associated with physical symptoms and risks. Hormone replacement therapy is available to decrease symptoms and risks. You should talk to your health care provider about whether hormone replacement therapy is right for you.  Use sunscreen. Apply sunscreen liberally and repeatedly throughout the day. You should seek shade when your shadow is shorter than you. Protect yourself by wearing long sleeves, pants, a wide-brimmed hat, and sunglasses year round, whenever you are outdoors.  Once a month, do a whole body skin exam, using a mirror to look at the skin on your back. Tell your health care provider of new moles, moles that have irregular borders, moles that are larger than a pencil eraser, or moles that have changed in shape or color.  Stay current with required vaccines (immunizations).  Influenza vaccine. All adults should be immunized every year.  Tetanus, diphtheria, and acellular pertussis (Td, Tdap) vaccine. Pregnant women should  receive 1 dose of Tdap vaccine during each pregnancy. The dose should be obtained regardless of the length of time since the last dose. Immunization is preferred during the 27th-36th week of gestation. An adult who has not previously received Tdap or who does not know her vaccine status should receive 1 dose of Tdap. This initial dose should be followed by tetanus and diphtheria toxoids (Td) booster doses every 10 years. Adults with an unknown or incomplete history of completing a 3-dose immunization series with Td-containing vaccines should begin or complete a primary immunization series including a Tdap dose. Adults should receive a Td booster every 10 years.  Varicella vaccine. An adult without evidence of immunity to varicella should receive 2 doses or a second dose if she has previously received 1 dose. Pregnant females who do not have evidence of immunity should receive the first dose after pregnancy. This first dose should be obtained before leaving the health care facility. The second dose should be obtained 4-8 weeks after the first dose.  Human papillomavirus (HPV) vaccine. Females aged 13-26 years who have not received the vaccine previously should obtain the 3-dose series. The vaccine is not recommended for use in pregnant females. However, pregnancy testing is not needed before receiving a dose. If a female is found to be pregnant after receiving a dose, no treatment is needed. In that case, the remaining doses should be delayed until after the pregnancy. Immunization is recommended for any person with an immunocompromised condition through the age of 26 years if she did not get any or all doses earlier. During the 3-dose series, the second dose should be obtained 4-8 weeks after the first dose. The third dose should be obtained 24 weeks after the first dose and 16 weeks after the second dose.  Zoster vaccine. One dose is recommended for adults aged 60 years or older unless certain conditions are  present.  Measles, mumps, and rubella (MMR) vaccine. Adults born before 1957 generally are considered immune to measles and mumps. Adults born in 1957 or later should have 1 or more doses of MMR vaccine unless there is a contraindication to the vaccine or there is laboratory evidence of immunity to   each of the three diseases. A routine second dose of MMR vaccine should be obtained at least 28 days after the first dose for students attending postsecondary schools, health care workers, or international travelers. People who received inactivated measles vaccine or an unknown type of measles vaccine during 1963-1967 should receive 2 doses of MMR vaccine. People who received inactivated mumps vaccine or an unknown type of mumps vaccine before 1979 and are at high risk for mumps infection should consider immunization with 2 doses of MMR vaccine. For females of childbearing age, rubella immunity should be determined. If there is no evidence of immunity, females who are not pregnant should be vaccinated. If there is no evidence of immunity, females who are pregnant should delay immunization until after pregnancy. Unvaccinated health care workers born before 1957 who lack laboratory evidence of measles, mumps, or rubella immunity or laboratory confirmation of disease should consider measles and mumps immunization with 2 doses of MMR vaccine or rubella immunization with 1 dose of MMR vaccine.  Pneumococcal 13-valent conjugate (PCV13) vaccine. When indicated, a person who is uncertain of her immunization history and has no record of immunization should receive the PCV13 vaccine. An adult aged 19 years or older who has certain medical conditions and has not been previously immunized should receive 1 dose of PCV13 vaccine. This PCV13 should be followed with a dose of pneumococcal polysaccharide (PPSV23) vaccine. The PPSV23 vaccine dose should be obtained at least 8 weeks after the dose of PCV13 vaccine. An adult aged 19  years or older who has certain medical conditions and previously received 1 or more doses of PPSV23 vaccine should receive 1 dose of PCV13. The PCV13 vaccine dose should be obtained 1 or more years after the last PPSV23 vaccine dose.  Pneumococcal polysaccharide (PPSV23) vaccine. When PCV13 is also indicated, PCV13 should be obtained first. All adults aged 65 years and older should be immunized. An adult younger than age 65 years who has certain medical conditions should be immunized. Any person who resides in a nursing home or long-term care facility should be immunized. An adult smoker should be immunized. People with an immunocompromised condition and certain other conditions should receive both PCV13 and PPSV23 vaccines. People with human immunodeficiency virus (HIV) infection should be immunized as soon as possible after diagnosis. Immunization during chemotherapy or radiation therapy should be avoided. Routine use of PPSV23 vaccine is not recommended for American Indians, [place] Natives, or people younger than 65 years unless there are medical conditions that require PPSV23 vaccine. When indicated, people who have unknown immunization and have no record of immunization should receive PPSV23 vaccine. One-time revaccination 5 years after the first dose of PPSV23 is recommended for people aged 19-64 years who have chronic kidney failure, nephrotic syndrome, asplenia, or immunocompromised conditions. People who received 1-2 doses of PPSV23 before age 65 years should receive another dose of PPSV23 vaccine at age 65 years or later if at least 5 years have passed since the previous dose. Doses of PPSV23 are not needed for people immunized with PPSV23 at or after age 65 years.  Meningococcal vaccine. Adults with asplenia or persistent complement component deficiencies should receive 2 doses of quadrivalent meningococcal conjugate (MenACWY-D) vaccine. The doses should be obtained at least 2 months apart.  Microbiologists working with certain meningococcal bacteria, military recruits, people at risk during an outbreak, and people who travel to or live in countries with a high rate of meningitis should be immunized. A first-year college student up through age   21 years who is living in a residence hall should receive a dose if she did not receive a dose on or after her 16th birthday. Adults who have certain high-risk conditions should receive one or more doses of vaccine.  Hepatitis A vaccine. Adults who wish to be protected from this disease, have certain high-risk conditions, work with hepatitis A-infected animals, work in hepatitis A research labs, or travel to or work in countries with a high rate of hepatitis A should be immunized. Adults who were previously unvaccinated and who anticipate close contact with an international adoptee during the first 60 days after arrival in the [place] from a country with a high rate of hepatitis A should be immunized.  Hepatitis B vaccine. Adults who wish to be protected from this disease, have certain high-risk conditions, may be exposed to blood or other infectious body fluids, are household contacts or sex partners of hepatitis B positive people, are clients or workers in certain care facilities, or travel to or work in countries with a high rate of hepatitis B should be immunized.  Haemophilus influenzae type b (Hib) vaccine. A previously unvaccinated person with asplenia or sickle cell disease or having a scheduled splenectomy should receive 1 dose of Hib vaccine. Regardless of previous immunization, a recipient of a hematopoietic stem cell transplant should receive a 3-dose series 6-12 months after her successful transplant. Hib vaccine is not recommended for adults with HIV infection. Preventive Services / Frequency Ages 64 to 68 years  Blood pressure check.** / Every 1 to 2 years.  Lipid and cholesterol check.** / Every 5 years beginning at age  22.  Clinical breast exam.** / Every 3 years for women in their 88s and 53s.  BRCA-related cancer risk assessment.** / For women who have family members with a BRCA-related cancer (breast, ovarian, tubal, or peritoneal cancers).  Pap test.** / Every 2 years from ages 90 through 51. Every 3 years starting at age 21 through age 56 or 3 with a history of 3 consecutive normal Pap tests.  HPV screening.** / Every 3 years from ages 24 through ages 1 to 46 with a history of 3 consecutive normal Pap tests.  Hepatitis C blood test.** / For any individual with known risks for hepatitis C.  Skin self-exam. / Monthly.  Influenza vaccine. / Every year.  Tetanus, diphtheria, and acellular pertussis (Tdap, Td) vaccine.** / Consult your health care provider. Pregnant women should receive 1 dose of Tdap vaccine during each pregnancy. 1 dose of Td every 10 years.  Varicella vaccine.** / Consult your health care provider. Pregnant females who do not have evidence of immunity should receive the first dose after pregnancy.  HPV vaccine. / 3 doses over 6 months, if 72 and younger. The vaccine is not recommended for use in pregnant females. However, pregnancy testing is not needed before receiving a dose.  Measles, mumps, rubella (MMR) vaccine.** / You need at least 1 dose of MMR if you were born in 1957 or later. You may also need a 2nd dose. For females of childbearing age, rubella immunity should be determined. If there is no evidence of immunity, females who are not pregnant should be vaccinated. If there is no evidence of immunity, females who are pregnant should delay immunization until after pregnancy.  Pneumococcal 13-valent conjugate (PCV13) vaccine.** / Consult your health care provider.  Pneumococcal polysaccharide (PPSV23) vaccine.** / 1 to 2 doses if you smoke cigarettes or if you have certain conditions.  Meningococcal vaccine.** /  1 dose if you are age 19 to 21 years and a first-year college  student living in a residence hall, or have one of several medical conditions, you need to get vaccinated against meningococcal disease. You may also need additional booster doses.  Hepatitis A vaccine.** / Consult your health care provider.  Hepatitis B vaccine.** / Consult your health care provider.  Haemophilus influenzae type b (Hib) vaccine.** / Consult your health care provider. Ages 40 to 64 years  Blood pressure check.** / Every 1 to 2 years.  Lipid and cholesterol check.** / Every 5 years beginning at age 20 years.  Lung cancer screening. / Every year if you are aged 55-80 years and have a 30-pack-year history of smoking and currently smoke or have quit within the past 15 years. Yearly screening is stopped once you have quit smoking for at least 15 years or develop a health problem that would prevent you from having lung cancer treatment.  Clinical breast exam.** / Every year after age 40 years.  BRCA-related cancer risk assessment.** / For women who have family members with a BRCA-related cancer (breast, ovarian, tubal, or peritoneal cancers).  Mammogram.** / Every year beginning at age 40 years and continuing for as long as you are in good health. Consult with your health care provider.  Pap test.** / Every 3 years starting at age 30 years through age 65 or 70 years with a history of 3 consecutive normal Pap tests.  HPV screening.** / Every 3 years from ages 30 years through ages 65 to 70 years with a history of 3 consecutive normal Pap tests.  Fecal occult blood test (FOBT) of stool. / Every year beginning at age 50 years and continuing until age 75 years. You may not need to do this test if you get a colonoscopy every 10 years.  Flexible sigmoidoscopy or colonoscopy.** / Every 5 years for a flexible sigmoidoscopy or every 10 years for a colonoscopy beginning at age 50 years and continuing until age 75 years.  Hepatitis C blood test.** / For all people born from 1945 through  1965 and any individual with known risks for hepatitis C.  Skin self-exam. / Monthly.  Influenza vaccine. / Every year.  Tetanus, diphtheria, and acellular pertussis (Tdap/Td) vaccine.** / Consult your health care provider. Pregnant women should receive 1 dose of Tdap vaccine during each pregnancy. 1 dose of Td every 10 years.  Varicella vaccine.** / Consult your health care provider. Pregnant females who do not have evidence of immunity should receive the first dose after pregnancy.  Zoster vaccine.** / 1 dose for adults aged 60 years or older.  Measles, mumps, rubella (MMR) vaccine.** / You need at least 1 dose of MMR if you were born in 1957 or later. You may also need a 2nd dose. For females of childbearing age, rubella immunity should be determined. If there is no evidence of immunity, females who are not pregnant should be vaccinated. If there is no evidence of immunity, females who are pregnant should delay immunization until after pregnancy.  Pneumococcal 13-valent conjugate (PCV13) vaccine.** / Consult your health care provider.  Pneumococcal polysaccharide (PPSV23) vaccine.** / 1 to 2 doses if you smoke cigarettes or if you have certain conditions.  Meningococcal vaccine.** / Consult your health care provider.  Hepatitis A vaccine.** / Consult your health care provider.  Hepatitis B vaccine.** / Consult your health care provider.  Haemophilus influenzae type b (Hib) vaccine.** / Consult your health care provider. Ages 65   years and over  Blood pressure check.** / Every 1 to 2 years.  Lipid and cholesterol check.** / Every 5 years beginning at age 20 years.  Lung cancer screening. / Every year if you are aged 55-80 years and have a 30-pack-year history of smoking and currently smoke or have quit within the past 15 years. Yearly screening is stopped once you have quit smoking for at least 15 years or develop a health problem that would prevent you from having lung cancer  treatment.  Clinical breast exam.** / Every year after age 40 years.  BRCA-related cancer risk assessment.** / For women who have family members with a BRCA-related cancer (breast, ovarian, tubal, or peritoneal cancers).  Mammogram.** / Every year beginning at age 40 years and continuing for as long as you are in good health. Consult with your health care provider.  Pap test.** / Every 3 years starting at age 30 years through age 65 or 70 years with 3 consecutive normal Pap tests. Testing can be stopped between 65 and 70 years with 3 consecutive normal Pap tests and no abnormal Pap or HPV tests in the past 10 years.  HPV screening.** / Every 3 years from ages 30 years through ages 65 or 70 years with a history of 3 consecutive normal Pap tests. Testing can be stopped between 65 and 70 years with 3 consecutive normal Pap tests and no abnormal Pap or HPV tests in the past 10 years.  Fecal occult blood test (FOBT) of stool. / Every year beginning at age 50 years and continuing until age 75 years. You may not need to do this test if you get a colonoscopy every 10 years.  Flexible sigmoidoscopy or colonoscopy.** / Every 5 years for a flexible sigmoidoscopy or every 10 years for a colonoscopy beginning at age 50 years and continuing until age 75 years.  Hepatitis C blood test.** / For all people born from 1945 through 1965 and any individual with known risks for hepatitis C.  Osteoporosis screening.** / A one-time screening for women ages 65 years and over and women at risk for fractures or osteoporosis.  Skin self-exam. / Monthly.  Influenza vaccine. / Every year.  Tetanus, diphtheria, and acellular pertussis (Tdap/Td) vaccine.** / 1 dose of Td every 10 years.  Varicella vaccine.** / Consult your health care provider.  Zoster vaccine.** / 1 dose for adults aged 60 years or older.  Pneumococcal 13-valent conjugate (PCV13) vaccine.** / Consult your health care provider.  Pneumococcal  polysaccharide (PPSV23) vaccine.** / 1 dose for all adults aged 65 years and older.  Meningococcal vaccine.** / Consult your health care provider.  Hepatitis A vaccine.** / Consult your health care provider.  Hepatitis B vaccine.** / Consult your health care provider.  Haemophilus influenzae type b (Hib) vaccine.** / Consult your health care provider.   Health Maintenance Adopting a healthy lifestyle and getting preventive care can go a long way to promote health and wellness. Talk with your health care provider about what schedule of regular examinations is right for you. This is a good chance for you to check in with your provider about disease prevention and staying healthy. In between checkups, there are plenty of things you can do on your own. Experts have done a lot of research about which lifestyle changes and preventive measures are most likely to keep you healthy. Ask your health care provider for more information. WEIGHT AND DIET  Eat a healthy diet Be sure to include plenty of vegetables,   fruits, low-fat dairy products, and lean protein. Do not eat a lot of foods high in solid fats, added sugars, or salt. Get regular exercise. This is one of the most important things you can do for your health. Most adults should exercise for at least 150 minutes each week. The exercise should increase your heart rate and make you sweat (moderate-intensity exercise). Most adults should also do strengthening exercises at least twice a week. This is in addition to the moderate-intensity exercise.  Maintain a healthy weight Body mass index (BMI) is a measurement that can be used to identify possible weight problems. It estimates body fat based on height and weight. Your health care provider can help determine your BMI and help you achieve or maintain a healthy weight. For females 20 years of age and older:  A BMI below 18.5 is considered underweight. A BMI of 18.5 to 24.9 is normal. A BMI of 25 to  29.9 is considered overweight. A BMI of 30 and above is considered obese.  Watch levels of cholesterol and blood lipids You should start having your blood tested for lipids and cholesterol at 60 years of age, then have this test every 5 years. You may need to have your cholesterol levels checked more often if: Your lipid or cholesterol levels are high. You are older than 60 years of age. You are at high risk for heart disease.  CANCER SCREENING   Lung Cancer Lung cancer screening is recommended for adults 55-80 years old who are at high risk for lung cancer because of a history of smoking. A yearly low-dose CT scan of the lungs is recommended for people who: Currently smoke. Have quit within the past 15 years. Have at least a 30-pack-year history of smoking. A pack year is smoking an average of one pack of cigarettes a day for 1 year. Yearly screening should continue until it has been 15 years since you quit. Yearly screening should stop if you develop a health problem that would prevent you from having lung cancer treatment.  Breast Cancer Practice breast self-awareness. This means understanding how your breasts normally appear and feel. It also means doing regular breast self-exams. Let your health care provider know about any changes, no matter how small. If you are in your 20s or 30s, you should have a clinical breast exam (CBE) by a health care provider every 1-3 years as part of a regular health exam. If you are 40 or older, have a CBE every year. Also consider having a breast X-ray (mammogram) every year. If you have a family history of breast cancer, talk to your health care provider about genetic screening. If you are at high risk for breast cancer, talk to your health care provider about having an MRI and a mammogram every year. Breast cancer gene (BRCA) assessment is recommended for women who have family members with BRCA-related cancers. BRCA-related cancers  include: Breast. Ovarian. Tubal. Peritoneal cancers. Results of the assessment will determine the need for genetic counseling and BRCA1 and BRCA2 testing. Cervical Cancer Routine pelvic examinations to screen for cervical cancer are no longer recommended for nonpregnant women who are considered low risk for cancer of the pelvic organs (ovaries, uterus, and vagina) and who do not have symptoms. A pelvic examination may be necessary if you have symptoms including those associated with pelvic infections. Ask your health care provider if a screening pelvic exam is right for you.  The Pap test is the screening test for cervical cancer for   women who are considered at risk. If you had a hysterectomy for a problem that was not cancer or a condition that could lead to cancer, then you no longer need Pap tests. If you are older than 65 years, and you have had normal Pap tests for the past 10 years, you no longer need to have Pap tests. If you have had past treatment for cervical cancer or a condition that could lead to cancer, you need Pap tests and screening for cancer for at least 20 years after your treatment. If you no longer get a Pap test, assess your risk factors if they change (such as having a new sexual partner). This can affect whether you should start being screened again. Some women have medical problems that increase their chance of getting cervical cancer. If this is the case for you, your health care provider may recommend more frequent screening and Pap tests. The human papillomavirus (HPV) test is another test that may be used for cervical cancer screening. The HPV test looks for the virus that can cause cell changes in the cervix. The cells collected during the Pap test can be tested for HPV. The HPV test can be used to screen women 72 years of age and older. Getting tested for HPV can extend the interval between normal Pap tests from three to five years. An HPV test also should be used to  screen women of any age who have unclear Pap test results. After 60 years of age, women should have HPV testing as often as Pap tests.  Colorectal Cancer This type of cancer can be detected and often prevented. Routine colorectal cancer screening usually begins at 59 years of age and continues through 60 years of age. Your health care provider may recommend screening at an earlier age if you have risk factors for colon cancer. Your health care provider may also recommend using home test kits to check for hidden blood in the stool. A small camera at the end of a tube can be used to examine your colon directly (sigmoidoscopy or colonoscopy). This is done to check for the earliest forms of colorectal cancer. Routine screening usually begins at age 58. Direct examination of the colon should be repeated every 5-10 years through 60 years of age. However, you may need to be screened more often if early forms of precancerous polyps or small growths are found. Skin Cancer Check your skin from head to toe regularly. Tell your health care provider about any new moles or changes in moles, especially if there is a change in a mole's shape or color. Also tell your health care provider if you have a mole that is larger than the size of a pencil eraser. Always use sunscreen. Apply sunscreen liberally and repeatedly throughout the day. Protect yourself by wearing long sleeves, pants, a wide-brimmed hat, and sunglasses whenever you are outside. HEART DISEASE, DIABETES, AND HIGH BLOOD PRESSURE  Have your blood pressure checked at least every 1-2 years. High blood pressure causes heart disease and increases the risk of stroke. If you are between 61 years and 77 years old, ask your health care provider if you should take aspirin to prevent strokes. Have regular diabetes screenings. This involves taking a blood sample to check your fasting blood sugar level. If you are at a normal weight and have a low risk for  diabetes, have this test once every three years after 60 years of age. If you are overweight and have a high risk  for diabetes, consider being tested at a younger age or more often. PREVENTING INFECTION  Hepatitis B If you have a higher risk for hepatitis B, you should be screened for this virus. You are considered at high risk for hepatitis B if: You were born in a country where hepatitis B is common. Ask your health care provider which countries are considered high risk. Your parents were born in a high-risk country, and you have not been immunized against hepatitis B (hepatitis B vaccine). You have HIV or AIDS. You use needles to inject street drugs. You live with someone who has hepatitis B. You have had sex with someone who has hepatitis B. You get hemodialysis treatment. You take certain medicines for conditions, including cancer, organ transplantation, and autoimmune conditions. Hepatitis C Blood testing is recommended for: Everyone born from 1945 through 1965. Anyone with known risk factors for hepatitis C. Sexually transmitted infections (STIs) You should be screened for sexually transmitted infections (STIs) including gonorrhea and chlamydia if: You are sexually active and are younger than 60 years of age. You are older than 60 years of age and your health care provider tells you that you are at risk for this type of infection. Your sexual activity has changed since you were last screened and you are at an increased risk for chlamydia or gonorrhea. Ask your health care provider if you are at risk. If you do not have HIV, but are at risk, it may be recommended that you take a prescription medicine daily to prevent HIV infection. This is called pre-exposure prophylaxis (PrEP). You are considered at risk if: You are sexually active and do not regularly use condoms or know the HIV status of your partner(s). You take drugs by injection. You are sexually active with a partner who has  HIV. Talk with your health care provider about whether you are at high risk of being infected with HIV. If you choose to begin PrEP, you should first be tested for HIV. You should then be tested every 3 months for as long as you are taking PrEP.  PREGNANCY  If you are premenopausal and you may become pregnant, ask your health care provider about preconception counseling. If you may become pregnant, take 400 to 800 micrograms (mcg) of folic acid every day. If you want to prevent pregnancy, talk to your health care provider about birth control (contraception). OSTEOPOROSIS AND MENOPAUSE  Osteoporosis is a disease in which the bones lose minerals and strength with aging. This can result in serious bone fractures. Your risk for osteoporosis can be identified using a bone density scan. If you are 65 years of age or older, or if you are at risk for osteoporosis and fractures, ask your health care provider if you should be screened. Ask your health care provider whether you should take a calcium or vitamin D supplement to lower your risk for osteoporosis. Menopause may have certain physical symptoms and risks. Hormone replacement therapy may reduce some of these symptoms and risks. Talk to your health care provider about whether hormone replacement therapy is right for you.  HOME CARE INSTRUCTIONS  Schedule regular health, dental, and eye exams. Stay current with your immunizations.  Do not use any tobacco products including cigarettes, chewing tobacco, or electronic cigarettes. If you are pregnant, do not drink alcohol. If you are breastfeeding, limit how much and how often you drink alcohol. Limit alcohol intake to no more than 1 drink per day for nonpregnant women. One drink equals 12   ounces of beer, 5 ounces of wine, or 1 ounces of hard liquor. Do not use street drugs. Do not share needles. Ask your health care provider for help if you need support or information about quitting drugs. Tell your  health care provider if you often feel depressed. Tell your health care provider if you have ever been abused or do not feel safe at home

## 2015-12-29 ENCOUNTER — Encounter: Payer: Self-pay | Admitting: Internal Medicine

## 2015-12-29 ENCOUNTER — Ambulatory Visit (INDEPENDENT_AMBULATORY_CARE_PROVIDER_SITE_OTHER): Payer: BLUE CROSS/BLUE SHIELD | Admitting: Internal Medicine

## 2015-12-29 VITALS — BP 120/66 | HR 76 | Temp 97.1°F | Ht 62.0 in | Wt 128.4 lb

## 2015-12-29 DIAGNOSIS — Z0001 Encounter for general adult medical examination with abnormal findings: Secondary | ICD-10-CM

## 2015-12-29 DIAGNOSIS — Z23 Encounter for immunization: Secondary | ICD-10-CM

## 2015-12-29 DIAGNOSIS — Z0000 Encounter for general adult medical examination without abnormal findings: Secondary | ICD-10-CM

## 2015-12-29 DIAGNOSIS — R7303 Prediabetes: Secondary | ICD-10-CM

## 2015-12-29 DIAGNOSIS — Z1212 Encounter for screening for malignant neoplasm of rectum: Secondary | ICD-10-CM

## 2015-12-29 DIAGNOSIS — E559 Vitamin D deficiency, unspecified: Secondary | ICD-10-CM

## 2015-12-29 DIAGNOSIS — R5383 Other fatigue: Secondary | ICD-10-CM

## 2015-12-29 DIAGNOSIS — Z79899 Other long term (current) drug therapy: Secondary | ICD-10-CM

## 2015-12-29 DIAGNOSIS — E782 Mixed hyperlipidemia: Secondary | ICD-10-CM

## 2015-12-29 DIAGNOSIS — Z136 Encounter for screening for cardiovascular disorders: Secondary | ICD-10-CM

## 2015-12-29 DIAGNOSIS — I10 Essential (primary) hypertension: Secondary | ICD-10-CM

## 2015-12-29 LAB — CBC WITH DIFFERENTIAL/PLATELET
BASOS ABS: 47 {cells}/uL (ref 0–200)
Basophils Relative: 1 %
EOS ABS: 94 {cells}/uL (ref 15–500)
Eosinophils Relative: 2 %
HCT: 38.8 % (ref 35.0–45.0)
HEMOGLOBIN: 12.9 g/dL (ref 11.7–15.5)
LYMPHS ABS: 1692 {cells}/uL (ref 850–3900)
Lymphocytes Relative: 36 %
MCH: 30.1 pg (ref 27.0–33.0)
MCHC: 33.2 g/dL (ref 32.0–36.0)
MCV: 90.7 fL (ref 80.0–100.0)
MPV: 9.4 fL (ref 7.5–12.5)
Monocytes Absolute: 329 cells/uL (ref 200–950)
Monocytes Relative: 7 %
NEUTROS ABS: 2538 {cells}/uL (ref 1500–7800)
Neutrophils Relative %: 54 %
Platelets: 249 10*3/uL (ref 140–400)
RBC: 4.28 MIL/uL (ref 3.80–5.10)
RDW: 13.2 % (ref 11.0–15.0)
WBC: 4.7 10*3/uL (ref 3.8–10.8)

## 2015-12-29 LAB — TSH: TSH: 0.82 m[IU]/L

## 2015-12-29 LAB — VITAMIN B12: VITAMIN B 12: 549 pg/mL (ref 200–1100)

## 2015-12-30 LAB — LIPID PANEL
CHOL/HDL RATIO: 3.3 ratio (ref <5.0)
CHOLESTEROL: 118 mg/dL (ref <200)
HDL: 36 mg/dL — AB (ref >50)
LDL Cholesterol: 57 mg/dL (ref <100)
Triglycerides: 127 mg/dL (ref <150)
VLDL: 25 mg/dL (ref <30)

## 2015-12-30 LAB — URINALYSIS, ROUTINE W REFLEX MICROSCOPIC
BILIRUBIN URINE: NEGATIVE
Glucose, UA: NEGATIVE
Hgb urine dipstick: NEGATIVE
KETONES UR: NEGATIVE
Leukocytes, UA: NEGATIVE
NITRITE: NEGATIVE
PROTEIN: NEGATIVE
Specific Gravity, Urine: 1.006 (ref 1.001–1.035)
pH: 6.5 (ref 5.0–8.0)

## 2015-12-30 LAB — BASIC METABOLIC PANEL WITH GFR
BUN: 16 mg/dL (ref 7–25)
CHLORIDE: 109 mmol/L (ref 98–110)
CO2: 19 mmol/L — AB (ref 20–31)
CREATININE: 0.79 mg/dL (ref 0.50–0.99)
Calcium: 9.9 mg/dL (ref 8.6–10.4)
GFR, Est African American: >89 mL/min (ref >=60)
GFR, Est Non African American: 82 mL/min (ref >=60)
GLUCOSE: 97 mg/dL (ref 65–99)
Potassium: 4.6 mmol/L (ref 3.5–5.3)
Sodium: 144 mmol/L (ref 135–146)

## 2015-12-30 LAB — HEPATIC FUNCTION PANEL
ALBUMIN: 4.4 g/dL (ref 3.6–5.1)
ALT: 17 U/L (ref 6–29)
AST: 32 U/L (ref 10–35)
Alkaline Phosphatase: 84 U/L (ref 33–130)
Bilirubin, Direct: 0.1 mg/dL (ref <=0.2)
Indirect Bilirubin: 0.2 mg/dL (ref 0.2–1.2)
Total Bilirubin: 0.3 mg/dL (ref 0.2–1.2)
Total Protein: 6.9 g/dL (ref 6.1–8.1)

## 2015-12-30 LAB — MICROALBUMIN / CREATININE URINE RATIO
Creatinine, Urine: 22 mg/dL (ref 20–320)
Microalb, Ur: <0.2 mg/dL

## 2015-12-30 LAB — IRON AND TIBC
%SAT: 19 % (ref 11–50)
IRON: 69 ug/dL (ref 45–160)
TIBC: 361 ug/dL (ref 250–450)
UIBC: 292 ug/dL (ref 125–400)

## 2015-12-30 LAB — HEMOGLOBIN A1C
Hgb A1c MFr Bld: 5.2 % (ref <5.7)
Mean Plasma Glucose: 103 mg/dL

## 2015-12-30 LAB — MAGNESIUM: MAGNESIUM: 2 mg/dL (ref 1.5–2.5)

## 2015-12-30 LAB — INSULIN, RANDOM: Insulin: 30.9 u[IU]/mL — ABNORMAL HIGH (ref 2.0–19.6)

## 2015-12-30 LAB — VITAMIN D 25 HYDROXY (VIT D DEFICIENCY, FRACTURES): VIT D 25 HYDROXY: 93 ng/mL (ref 30–100)

## 2016-01-18 ENCOUNTER — Other Ambulatory Visit: Payer: Self-pay | Admitting: Internal Medicine

## 2016-01-24 ENCOUNTER — Ambulatory Visit (INDEPENDENT_AMBULATORY_CARE_PROVIDER_SITE_OTHER): Payer: BLUE CROSS/BLUE SHIELD | Admitting: Internal Medicine

## 2016-01-24 VITALS — BP 130/78 | HR 72 | Temp 97.3°F | Resp 16 | Ht 61.0 in | Wt 130.8 lb

## 2016-01-24 DIAGNOSIS — D485 Neoplasm of uncertain behavior of skin: Secondary | ICD-10-CM

## 2016-01-24 DIAGNOSIS — D171 Benign lipomatous neoplasm of skin and subcutaneous tissue of trunk: Secondary | ICD-10-CM | POA: Diagnosis not present

## 2016-01-24 DIAGNOSIS — I10 Essential (primary) hypertension: Secondary | ICD-10-CM | POA: Diagnosis not present

## 2016-01-24 DIAGNOSIS — L821 Other seborrheic keratosis: Secondary | ICD-10-CM | POA: Diagnosis not present

## 2016-01-24 NOTE — Progress Notes (Signed)
[facility]   [name], M.D.    [name]. [name], P.A.-C      Starlyn Skeans, P.A.-C  [facility]                [address] [place], [place] [address] Telephone [phone] Telefax [phone]  Subjective:    Patient ID: Vicki Hansen, female    DOB: 1955/02/16, 61 y.o.   MRN: [id number]  HPI   This very nice 61 yo MWF presented for BP recheck and apparently well on current meds w/o c/o HA's, dizziness, CP, palpitations dyspnea or edema. Patient is also concerned about skin lesion on both breasts that have been increasing in size and have also become tender.   Medication Sig  . BIOTIN  Take  daily.  Marland Kitchen CALCIUM 600 mg  Take  daily.  . cetirizine 10 MG  Take  daily.  Marland Kitchen VITAMIN D 5,000 Units Take daily.  . enalapril  20 MG TAKE ONE TAB ONCE DAILY  . FLAX SEED OIL  4,800 mg Take  daily.   Marland Kitchen MAGNESIUM Take  daily.  . Multiple Vitamin  Take 1 cap daily.  . simvastatin40 MG tablet TAKE 1/2-1 TAB  EVERY NIGHT   No Known Allergies  Past Medical History:  Diagnosis Date  . GERD (gastroesophageal reflux disease)   . Hyperlipidemia   . Hypertension   . Iritis    HLA B27 +  . Prediabetes   . S/P ORIF (open reduction internal fixation) fracture 1994   Right tib/fib  . Thyroiditis, subacute   . Vitamin D deficiency    Past Surgical History:  Procedure Laterality Date  . CESAREAN SECTION    . KNEE ARTHROSCOPY Left 1972  . right leg repair  1994   rod   . STAPEDECTOMY Left 2003  . TONSILLECTOMY AND ADENOIDECTOMY     Review of Systems  10 point systems review negative except as above.    Objective:   Physical Exam  BP 130/78   Pulse 72   Temp 97.3 F (36.3 C)   Resp 16   Ht 5' 1"  (1.549 m)   Wt 130 lb 12.8 oz (59.3 kg)   BMI 24.71 kg/m   HEENT - WNL Neck - supple. Nl Thyroid. Carotids 2+ & No bruits, nodes Chest - Clear. Cor - Nl HS. RRR w/o sig M. No edema. MS- FROM w/o  deformities. . Gait Nl. Neuro -  Nl w/o focal abnormalities. Skin - Over the Left breast at 5 o'clock ~3" from the areola, there is a  23 mm broad based exophytic lesion.  Over the Right Breast at 7 o'clock ~ 3" from the areola, there is a 16 mm raised irregular lesion.  1) Procedure #1 (CPT 11403) Left Breast   After informed consent and aseptic prep w/alcohol scrub, the area was locally anesthetized with 2 ml of Bupivacaine 0.5%. Then with a #15 scalpel the lesion was sharply excised in toto with a 2+ mm margin to the deep sub-cut. in an elliptical fashion. #3 vertical mattress sutures of Nylon 3-0 were placed to approximate the wound edges which were then aligned and everted and secured with a running suture (# 7 sutures) of Nylon 4-0.   2) Procedure #2 (CPT 11402) Right Breast   After similar prep and local anesthesia with 2 ml Bupivacaine 0.5%, the 2sd lesion was  excised in similar fashion with a #10 scalpel. Then with #3 vertical mattress sutures with Nylon 3-0 and a running suture (# 6 sutures)  with Nylon 4-0, the wound was similiarly closed.   Both wounds were cleansed with green surgical soap and covered with 2 x 2 " gauze. Then to each was applied a Tegoderm 4" x 6" sterile dressing.   Both lesion were sent for Derm. Path analysis  Patient was instructed in po surgical wound care and given # 2 more Tegaderm to have on hand if needed. ROV 1 week for partial suture removal.    Assessment & Plan:   1. Essential hypertension   2. Neoplasm of uncertain behavior of skin

## 2016-01-31 ENCOUNTER — Ambulatory Visit (INDEPENDENT_AMBULATORY_CARE_PROVIDER_SITE_OTHER): Payer: BLUE CROSS/BLUE SHIELD | Admitting: Internal Medicine

## 2016-01-31 VITALS — BP 120/76 | HR 72 | Temp 97.0°F | Resp 16 | Ht 61.0 in | Wt 130.8 lb

## 2016-01-31 DIAGNOSIS — D485 Neoplasm of uncertain behavior of skin: Secondary | ICD-10-CM

## 2016-01-31 NOTE — Progress Notes (Signed)
Patient returns for f/u suture removal s/p excision skin lesions of both breasts. Path was benign on both lesions.   Both wounds appear clean w/ adequate healing ridge and no sign of infection.  All sutures removed and steri-strips applied to reinforce each wound and patient instructed to maintain dressing x 1 week.   ROV - prn

## 2016-02-18 ENCOUNTER — Other Ambulatory Visit: Payer: Self-pay

## 2016-02-18 DIAGNOSIS — Z1212 Encounter for screening for malignant neoplasm of rectum: Secondary | ICD-10-CM

## 2016-02-18 LAB — POC HEMOCCULT BLD/STL (HOME/3-CARD/SCREEN)
Card #2 Fecal Occult Blod, POC: NEGATIVE
FECAL OCCULT BLD: NEGATIVE
FECAL OCCULT BLD: NEGATIVE

## 2016-02-25 ENCOUNTER — Encounter: Payer: Self-pay | Admitting: Gastroenterology

## 2016-03-13 ENCOUNTER — Other Ambulatory Visit: Payer: Self-pay | Admitting: Physician Assistant

## 2016-03-20 DIAGNOSIS — Z1231 Encounter for screening mammogram for malignant neoplasm of breast: Secondary | ICD-10-CM | POA: Diagnosis not present

## 2016-03-20 DIAGNOSIS — Z6824 Body mass index (BMI) 24.0-24.9, adult: Secondary | ICD-10-CM | POA: Diagnosis not present

## 2016-03-20 DIAGNOSIS — Z01419 Encounter for gynecological examination (general) (routine) without abnormal findings: Secondary | ICD-10-CM | POA: Diagnosis not present

## 2016-03-20 DIAGNOSIS — Z13820 Encounter for screening for osteoporosis: Secondary | ICD-10-CM | POA: Diagnosis not present

## 2016-03-21 LAB — HM MAMMOGRAPHY

## 2016-03-24 LAB — HM PAP SMEAR: HM Pap smear: NORMAL

## 2016-03-29 DIAGNOSIS — H25013 Cortical age-related cataract, bilateral: Secondary | ICD-10-CM | POA: Diagnosis not present

## 2016-03-29 DIAGNOSIS — H04123 Dry eye syndrome of bilateral lacrimal glands: Secondary | ICD-10-CM | POA: Diagnosis not present

## 2016-03-29 DIAGNOSIS — H2513 Age-related nuclear cataract, bilateral: Secondary | ICD-10-CM | POA: Diagnosis not present

## 2016-03-29 DIAGNOSIS — H35033 Hypertensive retinopathy, bilateral: Secondary | ICD-10-CM | POA: Diagnosis not present

## 2016-03-29 DIAGNOSIS — H35032 Hypertensive retinopathy, left eye: Secondary | ICD-10-CM | POA: Diagnosis not present

## 2016-03-29 DIAGNOSIS — H524 Presbyopia: Secondary | ICD-10-CM | POA: Diagnosis not present

## 2016-03-29 DIAGNOSIS — H35031 Hypertensive retinopathy, right eye: Secondary | ICD-10-CM | POA: Diagnosis not present

## 2016-04-06 ENCOUNTER — Ambulatory Visit: Payer: Self-pay | Admitting: Internal Medicine

## 2016-04-25 ENCOUNTER — Ambulatory Visit (AMBULATORY_SURGERY_CENTER): Payer: Self-pay | Admitting: *Deleted

## 2016-04-25 ENCOUNTER — Encounter: Payer: Self-pay | Admitting: Gastroenterology

## 2016-04-25 ENCOUNTER — Encounter (INDEPENDENT_AMBULATORY_CARE_PROVIDER_SITE_OTHER): Payer: Self-pay

## 2016-04-25 VITALS — Ht 61.0 in | Wt 128.0 lb

## 2016-04-25 DIAGNOSIS — Z1211 Encounter for screening for malignant neoplasm of colon: Secondary | ICD-10-CM

## 2016-04-25 MED ORDER — NA SULFATE-K SULFATE-MG SULF 17.5-3.13-1.6 GM/177ML PO SOLN: 1.0000 | Freq: Once | ORAL | 0 refills | Status: AC

## 2016-04-25 NOTE — Progress Notes (Signed)
No egg or soy allergy known to patient   issues with past sedation with any surgeries  or procedures OF POST OP N/V, no intubation problems  No diet pills per patient No home 02 use per patient  No blood thinners per patient  Pt denies issues with constipation  No A fib or A flutter  EMMI VIDEO TO E MAIL  Pt has a DNR, living will, HCPA, and blood refusal card- instructed pt on advanced direc policy here in the [facility]- she understands policy  Pay no more $09 coupon to patient

## 2016-05-09 ENCOUNTER — Ambulatory Visit (AMBULATORY_SURGERY_CENTER): Payer: BLUE CROSS/BLUE SHIELD | Admitting: Gastroenterology

## 2016-05-09 ENCOUNTER — Encounter: Payer: Self-pay | Admitting: Gastroenterology

## 2016-05-09 VITALS — BP 118/69 | HR 63 | Temp 98.2°F | Resp 14 | Ht 61.0 in | Wt 128.0 lb

## 2016-05-09 DIAGNOSIS — Z1212 Encounter for screening for malignant neoplasm of rectum: Secondary | ICD-10-CM | POA: Diagnosis not present

## 2016-05-09 DIAGNOSIS — K649 Unspecified hemorrhoids: Secondary | ICD-10-CM | POA: Diagnosis not present

## 2016-05-09 DIAGNOSIS — Z1211 Encounter for screening for malignant neoplasm of colon: Secondary | ICD-10-CM

## 2016-05-09 MED ORDER — SODIUM CHLORIDE 0.9 % IV SOLN: 500.0000 mL | INTRAVENOUS | Status: DC

## 2016-05-09 NOTE — Patient Instructions (Signed)
Impression/recommendations:  Internal Hemorrhoids (handout given)  YOU HAD AN ENDOSCOPIC PROCEDURE TODAY AT [facility]:   Refer to the procedure report that was given to you for any specific questions about what was found during the examination.  If the procedure report does not answer your questions, please call your gastroenterologist to clarify.  If you requested that your care partner not be given the details of your procedure findings, then the procedure report has been included in a sealed envelope for you to review at your convenience later.  YOU SHOULD EXPECT: Some feelings of bloating in the abdomen. Passage of more gas than usual.  Walking can help get rid of the air that was put into your GI tract during the procedure and reduce the bloating. If you had a lower endoscopy (such as a colonoscopy or flexible sigmoidoscopy) you may notice spotting of blood in your stool or on the toilet paper. If you underwent a bowel prep for your procedure, you may not have a normal bowel movement for a few days.  Please Note:  You might notice some irritation and congestion in your nose or some drainage.  This is from the oxygen used during your procedure.  There is no need for concern and it should clear up in a day or so.  SYMPTOMS TO REPORT IMMEDIATELY:   Following lower endoscopy (colonoscopy or flexible sigmoidoscopy):  Excessive amounts of blood in the stool  Significant tenderness or worsening of abdominal pains  Swelling of the abdomen that is new, acute  Fever of 100F or higher  For urgent or emergent issues, a gastroenterologist can be reached at any hour by calling [phone].   DIET:  We do recommend a small meal at first, but then you may proceed to your regular diet.  Drink plenty of fluids but you should avoid alcoholic beverages for 24 hours.  ACTIVITY:  You should plan to take it easy for the rest of today and you should NOT DRIVE or use heavy machinery until  tomorrow (because of the sedation medicines used during the test).    FOLLOW UP: Our staff will call the number listed on your records the next business day following your procedure to check on you and address any questions or concerns that you may have regarding the information given to you following your procedure. If we do not reach you, we will leave a message.  However, if you are feeling well and you are not experiencing any problems, there is no need to return our call.  We will assume that you have returned to your regular daily activities without incident.  If any biopsies were taken you will be contacted by phone or by letter within the next 1-3 weeks.  Please call us at [phone] if you have not heard about the biopsies in 3 weeks.    SIGNATURES/CONFIDENTIALITY: You and/or your care partner have signed paperwork which will be entered into your electronic medical record.  These signatures attest to the fact that that the information above on your After Visit Summary has been reviewed and is understood.  Full responsibility of the confidentiality of this discharge information lies with you and/or your care-partner.

## 2016-05-09 NOTE — Progress Notes (Signed)
Report to PACU, RN, vss, BBS= Clear.  

## 2016-05-09 NOTE — Progress Notes (Signed)
Pt's states no medical or surgical changes since previsit or office visit. 

## 2016-05-09 NOTE — Op Note (Signed)
[facility] Patient Name: Vicki Hansen Procedure Date: 05/09/2016 9:14 AM MRN: [id number] Endoscopist: Milus Banister , MD Age: 61 Referring MD:  Date of Birth: 08-15-1955 Gender: Female Account #: [id number] Procedure:                Colonoscopy Indications:              Screening for colorectal malignant neoplasm;                            colonoscopy 2008-no polyps Medicines:                Monitored Anesthesia Care Procedure:                Pre-Anesthesia Assessment:                           - Prior to the procedure, a History and Physical                            was performed, and patient medications and                            allergies were reviewed. The patient's tolerance of                            previous anesthesia was also reviewed. The risks                            and benefits of the procedure and the sedation                            options and risks were discussed with the patient.                            All questions were answered, and informed consent                            was obtained. Prior Anticoagulants: The patient has                            taken no previous anticoagulant or antiplatelet                            agents. ASA Grade Assessment: II - A patient with                            mild systemic disease. After reviewing the risks                            and benefits, the patient was deemed in                            satisfactory condition to undergo the procedure.  After obtaining informed consent, the colonoscope                            was passed under direct vision. Throughout the                            procedure, the patient's blood pressure, pulse, and                            oxygen saturations were monitored continuously. The                            Colonoscope was introduced through the anus and                            advanced to the the cecum, identified by                            appendiceal orifice and ileocecal valve. The                            colonoscopy was performed without difficulty. The                            patient tolerated the procedure well. The quality                            of the bowel preparation was excellent. The                            ileocecal valve, appendiceal orifice, and rectum                            were photographed. Scope In: 9:17:54 AM Scope Out: 9:25:53 AM Scope Withdrawal Time: 0 hours 6 minutes 3 seconds  Total Procedure Duration: 0 hours 7 minutes 59 seconds  Findings:                 The entire examined colon appeared normal on direct                            and retroflexion views.                           Internal hemorrhoids were found. The hemorrhoids                            were medium-sized. Complications:            No immediate complications. Estimated blood loss:                            None. Estimated Blood Loss:     Estimated blood loss: none. Impression:               - The entire examined colon is normal on direct and  retroflexion views.                           - Internal hemorrhoids. Recommendation:           - Repeat colonoscopy in 10 years for screening                            purposes.                           - Patient has a contact number available for                            emergencies. The signs and symptoms of potential                            delayed complications were discussed with the                            patient. Return to normal activities tomorrow.                            Written discharge instructions were provided to the                            patient.                           - Resume previous diet.                           - Continue present medications. Milus Banister, MD 05/09/2016 9:27:44 AM This report has been signed electronically.

## 2016-05-10 ENCOUNTER — Telehealth: Payer: Self-pay | Admitting: *Deleted

## 2016-05-10 NOTE — Telephone Encounter (Signed)
  Follow up Call-  Call back number 05/09/2016  Post procedure Call Back phone  # [phone]  Permission to leave phone message Yes  Some recent data might be hidden     Patient questions:  Do you have a fever, pain , or abdominal swelling? No. Pain Score  0 *  Have you tolerated food without any problems? Yes.    Have you been able to return to your normal activities? Yes.    Do you have any questions about your discharge instructions: Diet   No. Medications  No. Follow up visit  No.  Do you have questions or concerns about your Care? No.  Actions: * If pain score is 4 or above: No action needed, pain <4.

## 2016-05-17 ENCOUNTER — Encounter: Payer: Self-pay | Admitting: Internal Medicine

## 2016-07-12 ENCOUNTER — Encounter: Payer: Self-pay | Admitting: Internal Medicine

## 2016-07-12 ENCOUNTER — Ambulatory Visit (INDEPENDENT_AMBULATORY_CARE_PROVIDER_SITE_OTHER): Payer: BLUE CROSS/BLUE SHIELD | Admitting: Internal Medicine

## 2016-07-12 VITALS — BP 136/84 | HR 65 | Temp 97.3°F | Resp 16 | Ht 61.0 in | Wt 123.4 lb

## 2016-07-12 DIAGNOSIS — R7303 Prediabetes: Secondary | ICD-10-CM

## 2016-07-12 DIAGNOSIS — E559 Vitamin D deficiency, unspecified: Secondary | ICD-10-CM | POA: Diagnosis not present

## 2016-07-12 DIAGNOSIS — E782 Mixed hyperlipidemia: Secondary | ICD-10-CM

## 2016-07-12 DIAGNOSIS — I10 Essential (primary) hypertension: Secondary | ICD-10-CM | POA: Diagnosis not present

## 2016-07-12 DIAGNOSIS — Z79899 Other long term (current) drug therapy: Secondary | ICD-10-CM

## 2016-07-12 LAB — CBC WITH DIFFERENTIAL/PLATELET
BASOS PCT: 1 %
Basophils Absolute: 60 cells/uL (ref 0–200)
EOS PCT: 1 %
Eosinophils Absolute: 60 cells/uL (ref 15–500)
HCT: 39.8 % (ref 35.0–45.0)
Hemoglobin: 13.2 g/dL (ref 11.7–15.5)
LYMPHS PCT: 36 %
Lymphs Abs: 2160 cells/uL (ref 850–3900)
MCH: 29.9 pg (ref 27.0–33.0)
MCHC: 33.2 g/dL (ref 32.0–36.0)
MCV: 90.2 fL (ref 80.0–100.0)
MONOS PCT: 7 %
MPV: 9.4 fL (ref 7.5–12.5)
Monocytes Absolute: 420 cells/uL (ref 200–950)
NEUTROS ABS: 3300 {cells}/uL (ref 1500–7800)
Neutrophils Relative %: 55 %
PLATELETS: 242 10*3/uL (ref 140–400)
RBC: 4.41 MIL/uL (ref 3.80–5.10)
RDW: 13 % (ref 11.0–15.0)
WBC: 6 10*3/uL (ref 3.8–10.8)

## 2016-07-12 LAB — TSH: TSH: 1.14 m[IU]/L

## 2016-07-12 NOTE — Patient Instructions (Signed)

## 2016-07-12 NOTE — Progress Notes (Signed)
This very nice 61 y.o. MWF presents for 3 month follow up with Hypertension, Hyperlipidemia, Pre-Diabetes and Vitamin D Deficiency.      Patient is treated for HTN (2010) & BP has been controlled at home. Today's BP is at goal - 136/84. Patient has had no complaints of any cardiac type chest pain, palpitations, dyspnea/orthopnea/PND, dizziness, claudication, or dependent edema.     Hyperlipidemia is controlled with diet & meds. Patient denies myalgias or other med SE's. Last Lipids were at goal: Lab Results  Component Value Date   CHOL 118 12/29/2015   HDL 36 (L) 12/29/2015   LDLCALC 57 12/29/2015   TRIG 127 12/29/2015   CHOLHDL 3.3 12/29/2015      Also, the patient has history of PreDiabetes (A1c 5.8% in 2014) and has had no symptoms of reactive hypoglycemia, diabetic polys, paresthesias or visual blurring.  Last A1c was at goal:  Lab Results  Component Value Date   HGBA1C 5.2 12/29/2015      Further, the patient also has history of Vitamin D Deficiency and supplements vitamin D without any suspected side-effects. Last vitamin D was at goal: Lab Results  Component Value Date   VD25OH 93 12/29/2015   Current Outpatient Prescriptions on File Prior to Visit  Medication Sig  . BIOTIN PO Take 5,000 mg elemental calcium/kg/hr by mouth daily.  Marland Kitchen CALCIUM PO Take 600 mg by mouth daily.  . cetirizine (ZYRTEC) 10 MG tablet Take 10 mg by mouth daily.  . Cholecalciferol (VITAMIN D PO) Take 5,000 Int'l Units by mouth daily.  . enalapril (VASOTEC) 20 MG tablet TAKE ONE TABLET BY MOUTH ONCE DAILY  . Flaxseed, Linseed, (FLAX SEED OIL PO) Take 4,800 mg by mouth daily.   Marland Kitchen MAGNESIUM PO Take 250 mg by mouth daily.  . Multiple Vitamin (MULTIVITAMIN) capsule Take 1 capsule by mouth daily.  . simvastatin (ZOCOR) 40 MG tablet TAKE ONE-HALF TO ONE TABLET BY MOUTH EVERY NIGHT AT BEDTIME FOR CHOLESTEROL   No current facility-administered medications on file prior to visit.    No Known  Allergies PMHx:   Past Medical History:  Diagnosis Date  . Allergy   . Arthritis    BACK   . Cancer (HCC)    UTERINE   . Cataract    MILD  . GERD (gastroesophageal reflux disease)   . Hyperlipidemia   . Hypertension   . Iritis    HLA B27 +  . Prediabetes   . S/P ORIF (open reduction internal fixation) fracture 1994   Right tib/fib  . Thyroiditis, subacute   . Vitamin D deficiency    Immunization History  Administered Date(s) Administered  . PPD Test 09/30/2013  . Pneumococcal-Unspecified 08/27/2008  . Tdap 07/18/2007  . Zoster 12/29/2015   Past Surgical History:  Procedure Laterality Date  . CESAREAN SECTION    . COLONOSCOPY  03/17/2005   HEMS   . KNEE ARTHROSCOPY Left 1972  . right leg repair  1994   rod   . ROBOTIC ASSISTED TOTAL HYSTERECTOMY  2015   pt. states she had robot assisted hysterectomy and uterus was removed abdominally due to scar tissue.  she also states that both tubes and ovaries were removed.  . STAPEDECTOMY Left 2003  . TONSILLECTOMY AND ADENOIDECTOMY     FHx:    Reviewed / unchanged  SHx:    Reviewed / unchanged  Systems Review:  Constitutional: Denies fever, chills, wt changes, headaches, insomnia, fatigue, night sweats, change in appetite.  Eyes: Denies redness, blurred vision, diplopia, discharge, itchy, watery eyes.  ENT: Denies discharge, congestion, post nasal drip, epistaxis, sore throat, earache, hearing loss, dental pain, tinnitus, vertigo, sinus pain, snoring.  CV: Denies chest pain, palpitations, irregular heartbeat, syncope, dyspnea, diaphoresis, orthopnea, PND, claudication or edema. Respiratory: denies cough, dyspnea, DOE, pleurisy, hoarseness, laryngitis, wheezing.  Gastrointestinal: Denies dysphagia, odynophagia, heartburn, reflux, water brash, abdominal pain or cramps, nausea, vomiting, bloating, diarrhea, constipation, hematemesis, melena, hematochezia  or hemorrhoids. Genitourinary: Denies dysuria, frequency, urgency,  nocturia, hesitancy, discharge, hematuria or flank pain. Musculoskeletal: Denies arthralgias, myalgias, stiffness, jt. swelling, pain, limping or strain/sprain.  Skin: Denies pruritus, rash, hives, warts, acne, eczema or change in skin lesion(s). Neuro: No weakness, tremor, incoordination, spasms, paresthesia or pain. Psychiatric: Denies confusion, memory loss or sensory loss. Endo: Denies change in weight, skin or hair change.  Heme/Lymph: No excessive bleeding, bruising or enlarged lymph nodes.  Physical Exam  BP 136/84   Pulse 65   Temp 97.3 F (36.3 C)   Resp 16   Ht 5' 1"  (1.549 m)   Wt 123 lb 6.4 oz (56 kg)   BMI 23.32 kg/m   Appears well nourished, well groomed  and in no distress.  Eyes: PERRLA, EOMs, conjunctiva no swelling or erythema. Sinuses: No frontal/maxillary tenderness ENT/Mouth: EAC's clear, TM's nl w/o erythema, bulging. Nares clear w/o erythema, swelling, exudates. Oropharynx clear without erythema or exudates. Oral hygiene is good. Tongue normal, non obstructing. Hearing intact.  Neck: Supple. Thyroid nl. Car 2+/2+ without bruits, nodes or JVD. Chest: Respirations nl with BS clear & equal w/o rales, rhonchi, wheezing or stridor.  Cor: Heart sounds normal w/ regular rate and rhythm without sig. murmurs, gallops, clicks or rubs. Peripheral pulses normal and equal  without edema.  Abdomen: Soft & bowel sounds normal. Non-tender w/o guarding, rebound, hernias, masses or organomegaly.  Lymphatics: Unremarkable.  Musculoskeletal: Full ROM all peripheral extremities, joint stability, 5/5 strength and normal gait.  Skin: Warm, dry without exposed rashes, lesions or ecchymosis apparent.  Neuro: Cranial nerves intact, reflexes equal bilaterally. Sensory-motor testing grossly intact. Tendon reflexes grossly intact.  Pysch: Alert & oriented x 3.  Insight and judgement nl & appropriate. No ideations.  Assessment and Plan:  1. Essential hypertension  - Continue  medication, monitor blood pressure at home.  - Continue DASH diet. Reminder to go to the ER if any CP,  SOB, nausea, dizziness, severe HA, changes vision/speech  - CBC with Differential/Platelet - BASIC METABOLIC PANEL WITH GFR - Magnesium - TSH  2. Hyperlipidemia, mixed  - Continue diet/meds, exercise,& lifestyle modifications.  - Continue monitor periodic cholesterol/liver & renal functions  - Hepatic function panel - Lipid panel - TSH  3. Prediabetes  - Continue diet, exercise, lifestyle modifications.  - Monitor appropriate labs.  - Hemoglobin A1c - Insulin, random  4. Vitamin D deficiency  - Continue supplementation.  - VITAMIN D 25 Hydroxy   5. Medication management  - CBC with Differential/Platelet - BASIC METABOLIC PANEL WITH GFR - Hepatic function panel - Magnesium - Lipid panel - TSH - Hemoglobin A1c - Insulin, random - VITAMIN D 25 Hydroxy       Discussed  regular exercise, BP monitoring, weight control to achieve/maintain BMI less than 25 and discussed med and SE's. Recommended labs to assess and monitor clinical status with further disposition pending results of labs. Over 30 minutes of exam, counseling, chart review was performed.

## 2016-07-13 LAB — BASIC METABOLIC PANEL WITH GFR
BUN: 14 mg/dL (ref 7–25)
CHLORIDE: 105 mmol/L (ref 98–110)
CO2: 23 mmol/L (ref 20–31)
CREATININE: 0.75 mg/dL (ref 0.50–0.99)
Calcium: 9.6 mg/dL (ref 8.6–10.4)
GFR, Est Non African American: 87 mL/min (ref >=60)
Glucose, Bld: 85 mg/dL (ref 65–99)
POTASSIUM: 4 mmol/L (ref 3.5–5.3)
SODIUM: 140 mmol/L (ref 135–146)

## 2016-07-13 LAB — LIPID PANEL
CHOL/HDL RATIO: 4.1 ratio (ref <5.0)
Cholesterol: 157 mg/dL (ref <200)
HDL: 38 mg/dL — ABNORMAL LOW (ref >50)
LDL CALC: 79 mg/dL (ref <100)
Triglycerides: 200 mg/dL — ABNORMAL HIGH (ref <150)
VLDL: 40 mg/dL — ABNORMAL HIGH (ref <30)

## 2016-07-13 LAB — HEPATIC FUNCTION PANEL
ALK PHOS: 98 U/L (ref 33–130)
ALT: 14 U/L (ref 6–29)
AST: 17 U/L (ref 10–35)
Albumin: 4.5 g/dL (ref 3.6–5.1)
BILIRUBIN DIRECT: 0.1 mg/dL (ref <=0.2)
BILIRUBIN TOTAL: 0.4 mg/dL (ref 0.2–1.2)
Indirect Bilirubin: 0.3 mg/dL (ref 0.2–1.2)
Total Protein: 7.1 g/dL (ref 6.1–8.1)

## 2016-07-13 LAB — MAGNESIUM: MAGNESIUM: 2.2 mg/dL (ref 1.5–2.5)

## 2016-07-13 LAB — INSULIN, RANDOM: Insulin: 4.4 u[IU]/mL (ref 2.0–19.6)

## 2016-07-13 LAB — HEMOGLOBIN A1C
Hgb A1c MFr Bld: 5.4 % (ref <5.7)
Mean Plasma Glucose: 108 mg/dL

## 2016-07-13 LAB — VITAMIN D 25 HYDROXY (VIT D DEFICIENCY, FRACTURES): VIT D 25 HYDROXY: 97 ng/mL (ref 30–100)

## 2016-07-24 ENCOUNTER — Other Ambulatory Visit: Payer: Self-pay | Admitting: Physician Assistant

## 2016-09-14 ENCOUNTER — Other Ambulatory Visit: Payer: Self-pay | Admitting: Internal Medicine

## 2016-10-30 ENCOUNTER — Ambulatory Visit: Payer: Self-pay | Admitting: Physician Assistant

## 2017-02-01 ENCOUNTER — Ambulatory Visit: Payer: BLUE CROSS/BLUE SHIELD | Admitting: Internal Medicine

## 2017-02-01 VITALS — BP 120/68 | HR 76 | Temp 97.1°F | Resp 16 | Ht 61.5 in | Wt 131.8 lb

## 2017-02-01 DIAGNOSIS — I10 Essential (primary) hypertension: Secondary | ICD-10-CM | POA: Diagnosis not present

## 2017-02-01 DIAGNOSIS — Z1211 Encounter for screening for malignant neoplasm of colon: Secondary | ICD-10-CM

## 2017-02-01 DIAGNOSIS — Z0000 Encounter for general adult medical examination without abnormal findings: Secondary | ICD-10-CM

## 2017-02-01 DIAGNOSIS — Z0001 Encounter for general adult medical examination with abnormal findings: Secondary | ICD-10-CM

## 2017-02-01 DIAGNOSIS — Z79899 Other long term (current) drug therapy: Secondary | ICD-10-CM

## 2017-02-01 DIAGNOSIS — R5383 Other fatigue: Secondary | ICD-10-CM

## 2017-02-01 DIAGNOSIS — Z1212 Encounter for screening for malignant neoplasm of rectum: Secondary | ICD-10-CM

## 2017-02-01 DIAGNOSIS — E559 Vitamin D deficiency, unspecified: Secondary | ICD-10-CM

## 2017-02-01 DIAGNOSIS — Z136 Encounter for screening for cardiovascular disorders: Secondary | ICD-10-CM | POA: Diagnosis not present

## 2017-02-01 DIAGNOSIS — E782 Mixed hyperlipidemia: Secondary | ICD-10-CM

## 2017-02-01 DIAGNOSIS — R7303 Prediabetes: Secondary | ICD-10-CM

## 2017-02-01 NOTE — Progress Notes (Signed)
[facility] [name], M.D.     [name]. [name], P.A.-C [name], [address] [place], [place] [address] Telephone [phone] Telefax [phone] Annual Screening/Preventative Visit & Comprehensive Evaluation &  Examination     This very nice 62 y.o. MWFpresents for a Screening/Preventative Visit & comprehensive evaluation and management of multiple medical co-morbidities.  Patient has been followed for HTN, Prediabetes, Hyperlipidemia and Vitamin D Deficiency.      HTN predates since 2010. Patient's BP has been controlled at home and patient denies any cardiac symptoms as chest pain, palpitations, shortness of breath, dizziness or ankle swelling. Today's BP is at goal - 120/68      Patient's hyperlipidemia is controlled with diet and medications. Patient denies myalgias or other medication SE's. Last lipids were at goal albeit elevated Trig's:  Lab Results  Component Value Date   CHOL 157 07/12/2016   HDL 38 (L) 07/12/2016   LDLCALC 79 07/12/2016   TRIG 200 (H) 07/12/2016   CHOLHDL 4.1 07/12/2016      Patient has prediabetes (A1c 5.8%/2014)  and patient denies reactive hypoglycemic symptoms, visual blurring, diabetic polys, or paresthesias. Last A1c was Normal & at goal: Lab Results  Component Value Date   HGBA1C 5.4 07/12/2016      Finally, patient has history of Vitamin D Deficiency and last Vitamin D was  Lab Results  Component Value Date   VD25OH 34 07/12/2016   Current Outpatient Medications on File Prior to Visit  Medication Sig  . BIOTIN PO Take 5,000 mg elemental calcium/kg/hr by mouth daily.  Marland Kitchen CALCIUM PO Take 600 mg by mouth daily.  . cetirizine (ZYRTEC) 10 MG tablet Take 10 mg by mouth daily.  . Cholecalciferol (VITAMIN D PO) Take 5,000 Int'l Units by mouth daily.  . enalapril (VASOTEC) 20 MG tablet TAKE 1 TABLET BY MOUTH ONCE DAILY  . Flaxseed, Linseed, (FLAX  SEED OIL PO) Take 4,800 mg by mouth daily.   Marland Kitchen MAGNESIUM PO Take 250 mg by mouth daily.  . Multiple Vitamin (MULTIVITAMIN) capsule Take 1 capsule by mouth daily.  . simvastatin (ZOCOR) 40 MG tablet TAKE ONE-HALF TO ONE TABLET BY MOUTH EVERY NIGHT AT BEDTIME FOR CHOLESTEROL   No current facility-administered medications on file prior to visit.    No Known Allergies Past Medical History:  Diagnosis Date  . Allergy   . Arthritis    BACK   . Cancer (HCC)    UTERINE   . Cataract    MILD  . GERD (gastroesophageal reflux disease)   . Hyperlipidemia   . Hypertension   . Iritis    HLA B27 +  . Prediabetes   . S/P ORIF (open reduction internal fixation) fracture 1994   Right tib/fib  . Thyroiditis, subacute   . Vitamin D deficiency    Health Maintenance  Topic Date Due  . MAMMOGRAM  06/10/2009  . PAP SMEAR  12/23/2013  . INFLUENZA VACCINE  10/04/2017 (Originally 08/16/2016)  . TETANUS/TDAP  07/17/2017  . COLONOSCOPY  05/10/2026  . Hepatitis C Screening  Completed  . HIV Screening  Completed   Immunization History  Administered Date(s) Administered  . PPD Test 09/30/2013  . Pneumococcal-Unspecified 08/27/2008  . Tdap 07/18/2007  . Zoster 12/29/2015   Last Colon -05/09/2016 - Dr [name] Last Pap & MGM - Apr 2018 - Dr Helane Rima   Past Surgical History:  Procedure Laterality Date  . CESAREAN SECTION    .  COLONOSCOPY  03/17/2005   HEMS   . KNEE ARTHROSCOPY Left 1972  . right leg repair  1994   rod   . ROBOTIC ASSISTED TOTAL HYSTERECTOMY  2015   pt. states she had robot assisted hysterectomy and uterus was removed abdominally due to scar tissue.  she also states that both tubes and ovaries were removed.  . STAPEDECTOMY Left 2003  . TONSILLECTOMY AND ADENOIDECTOMY     Family History  Problem Relation Age of Onset  . Cancer Mother        uterine, kidney  . Hypertension Mother   . Colon polyps Mother   . Stomach cancer Mother   . Diabetes Father   . Hypertension Father   .  Stroke Father   . Cancer Father        kidney  . Colon cancer Maternal Grandmother   . Colon cancer Maternal Grandfather   . Esophageal cancer Neg Hx   . Rectal cancer Neg Hx    Social History   Tobacco Use  . Smoking status: Never Smoker  . Smokeless tobacco: Never Used  Substance Use Topics  . Alcohol use: No  . Drug use: No    ROS Constitutional: Denies fever, chills, weight loss/gain, headaches, insomnia,  night sweats, and change in appetite. Does c/o fatigue. Eyes: Denies redness, blurred vision, diplopia, discharge, itchy, watery eyes.  ENT: Denies discharge, congestion, post nasal drip, epistaxis, sore throat, earache, hearing loss, dental pain, Tinnitus, Vertigo, Sinus pain, snoring.  Cardio: Denies chest pain, palpitations, irregular heartbeat, syncope, dyspnea, diaphoresis, orthopnea, PND, claudication, edema Respiratory: denies cough, dyspnea, DOE, pleurisy, hoarseness, laryngitis, wheezing.  Gastrointestinal: Denies dysphagia, heartburn, reflux, water brash, pain, cramps, nausea, vomiting, bloating, diarrhea, constipation, hematemesis, melena, hematochezia, jaundice, hemorrhoids Genitourinary: Denies dysuria, frequency, urgency, nocturia, hesitancy, discharge, hematuria, flank pain Breast: Breast lumps, nipple discharge, bleeding.  Musculoskeletal: Denies arthralgia, myalgia, stiffness, Jt. Swelling, pain, limp, and strain/sprain. Denies falls. Skin: Denies puritis, rash, hives, warts, acne, eczema, changing in skin lesion Neuro: No weakness, tremor, incoordination, spasms, paresthesia, pain Psychiatric: Denies confusion, memory loss, sensory loss. Denies Depression. Endocrine: Denies change in weight, skin, hair change, nocturia, and paresthesia, diabetic polys, visual blurring, hyper / hypo glycemic episodes.  Heme/Lymph: No excessive bleeding, bruising, enlarged lymph nodes.  Physical Exam  BP 120/68   Pulse 76   Temp (!) 97.1 F (36.2 C)   Resp 16   Ht 5' 1.5"  (1.562 m)   Wt 131 lb 12.8 oz (59.8 kg)   BMI 24.50 kg/m   General Appearance: Well nourished, well groomed and in no apparent distress.  Eyes: PERRLA, EOMs, conjunctiva no swelling or erythema, normal fundi and vessels. Sinuses: No frontal/maxillary tenderness ENT/Mouth: EACs patent / TMs  nl. Nares clear without erythema, swelling, mucoid exudates. Oral hygiene is good. No erythema, swelling, or exudate. Tongue normal, non-obstructing. Tonsils not swollen or erythematous. Hearing normal.  Neck: Supple, thyroid normal. No bruits, nodes or JVD. Respiratory: Respiratory effort normal.  BS equal and clear bilateral without rales, rhonci, wheezing or stridor. Cardio: Heart sounds are normal with regular rate and rhythm and no murmurs, rubs or gallops. Peripheral pulses are normal and equal bilaterally without edema. No aortic or femoral bruits. Chest: symmetric with normal excursions and percussion. Breasts: Symmetric, without lumps, nipple discharge, retractions, or fibrocystic changes.  Abdomen: Flat, soft with bowel sounds active. Nontender, no guarding, rebound, hernias, masses, or organomegaly.  Lymphatics: Non tender without lymphadenopathy.  Musculoskeletal: Full ROM all peripheral extremities, joint stability,  5/5 strength, and normal gait. Skin: Warm and dry without rashes, lesions, cyanosis, clubbing or  ecchymosis.  Neuro: Cranial nerves intact, reflexes equal bilaterally. Normal muscle tone, no cerebellar symptoms. Sensation intact.  Pysch: Alert and oriented X 3, normal affect, Insight and Judgment appropriate.   Assessment and Plan  1. Annual Preventative Screening Examination  2. Essential hypertension  - EKG 12-Lead - [place], RETROPERITNL ABD,  LTD - Urinalysis, Routine w reflex microscopic - Microalbumin / creatinine urine ratio - CBC with Differential/Platelet - BASIC METABOLIC PANEL WITH GFR - Magnesium - TSH  3. Hyperlipidemia, mixed  - EKG 12-Lead - [place],  RETROPERITNL ABD,  LTD - Hepatic function panel - Lipid panel - TSH  4. Prediabetes  - EKG 12-Lead - [place], RETROPERITNL ABD,  LTD - Hemoglobin A1c - Insulin, random  5. Vitamin D deficiency  - VITAMIN D 25 Hydroxy  6. Screening for ischemic heart disease  - EKG 12-Lead  7. Screening for AAA (aortic abdominal aneurysm)  - [place], RETROPERITNL ABD,  LTD  8. Encounter for colorectal cancer screening  - POC Hemoccult Bld/Stl (3-Cd Home Screen); Future  9. Fatigue, unspecified type  - Iron,Total/Total Iron Binding Cap - Vitamin B12 - CBC with Differential/Platelet - TSH  10. Medication management  - Urinalysis, Routine w reflex microscopic - Microalbumin / creatinine urine ratio - CBC with Differential/Platelet - BASIC METABOLIC PANEL WITH GFR - Hepatic function panel - Magnesium - Lipid panel - TSH - Hemoglobin A1c - Insulin, random - VITAMIN D 25 Hydroxy        Patient was counseled in prudent diet to achieve/maintain BMI less than 25 for weight control, BP monitoring, regular exercise and medications. Discussed med's effects and SE's. Screening labs and tests as requested with regular follow-up as recommended. Over 40 minutes of exam, counseling, chart review and high complex critical decision making was performed.

## 2017-02-01 NOTE — Patient Instructions (Signed)

## 2017-02-02 LAB — MICROALBUMIN / CREATININE URINE RATIO: Creatinine, Urine: 10 mg/dL — ABNORMAL LOW (ref 20–275)

## 2017-02-02 LAB — URINALYSIS, ROUTINE W REFLEX MICROSCOPIC
Bilirubin Urine: NEGATIVE
GLUCOSE, UA: NEGATIVE
HGB URINE DIPSTICK: NEGATIVE
Ketones, ur: NEGATIVE
LEUKOCYTES UA: NEGATIVE
NITRITE: NEGATIVE
PH: 6.5 (ref 5.0–8.0)
Protein, ur: NEGATIVE
SPECIFIC GRAVITY, URINE: 1.004 (ref 1.001–1.03)

## 2017-02-02 LAB — HEMOGLOBIN A1C
EAG (MMOL/L): 6.6 (calc)
HEMOGLOBIN A1C: 5.8 %{Hb} — AB (ref <5.7)
Mean Plasma Glucose: 120 (calc)

## 2017-02-02 LAB — IRON, TOTAL/TOTAL IRON BINDING CAP
%SAT: 24 % (ref 11–50)
Iron: 93 ug/dL (ref 45–160)
TIBC: 382 mcg/dL (calc) (ref 250–450)

## 2017-02-02 LAB — LIPID PANEL
CHOLESTEROL: 172 mg/dL (ref <200)
HDL: 44 mg/dL — AB (ref >50)
LDL CHOLESTEROL (CALC): 101 mg/dL — AB
Non-HDL Cholesterol (Calc): 128 mg/dL (calc) (ref <130)
TRIGLYCERIDES: 163 mg/dL — AB (ref <150)
Total CHOL/HDL Ratio: 3.9 (calc) (ref <5.0)

## 2017-02-02 LAB — BASIC METABOLIC PANEL WITH GFR
BUN: 18 mg/dL (ref 7–25)
CO2: 26 mmol/L (ref 20–32)
CREATININE: 0.74 mg/dL (ref 0.50–0.99)
Calcium: 10.3 mg/dL (ref 8.6–10.4)
Chloride: 105 mmol/L (ref 98–110)
GFR, Est African American: 101 mL/min/{1.73_m2} (ref >=60)
GFR, Est Non African American: 87 mL/min/{1.73_m2} (ref >=60)
GLUCOSE: 93 mg/dL (ref 65–99)
Potassium: 4.2 mmol/L (ref 3.5–5.3)
Sodium: 141 mmol/L (ref 135–146)

## 2017-02-02 LAB — HEPATIC FUNCTION PANEL
AG RATIO: 1.9 (calc) (ref 1.0–2.5)
ALT: 23 U/L (ref 6–29)
AST: 36 U/L — ABNORMAL HIGH (ref 10–35)
Albumin: 4.6 g/dL (ref 3.6–5.1)
Alkaline phosphatase (APISO): 115 U/L (ref 33–130)
Bilirubin, Direct: 0.1 mg/dL (ref 0.0–0.2)
GLOBULIN: 2.4 g/dL (ref 1.9–3.7)
Indirect Bilirubin: 0.2 mg/dL (calc) (ref 0.2–1.2)
TOTAL PROTEIN: 7 g/dL (ref 6.1–8.1)
Total Bilirubin: 0.3 mg/dL (ref 0.2–1.2)

## 2017-02-02 LAB — CBC WITH DIFFERENTIAL/PLATELET
BASOS PCT: 0.8 %
Basophils Absolute: 49 cells/uL (ref 0–200)
EOS ABS: 73 {cells}/uL (ref 15–500)
EOS PCT: 1.2 %
HEMATOCRIT: 41 % (ref 35.0–45.0)
HEMOGLOBIN: 13.7 g/dL (ref 11.7–15.5)
LYMPHS ABS: 1665 {cells}/uL (ref 850–3900)
MCH: 29.7 pg (ref 27.0–33.0)
MCHC: 33.4 g/dL (ref 32.0–36.0)
MCV: 88.9 fL (ref 80.0–100.0)
MPV: 9.8 fL (ref 7.5–12.5)
Monocytes Relative: 6.6 %
NEUTROS ABS: 3910 {cells}/uL (ref 1500–7800)
Neutrophils Relative %: 64.1 %
Platelets: 259 10*3/uL (ref 140–400)
RBC: 4.61 10*6/uL (ref 3.80–5.10)
RDW: 12 % (ref 11.0–15.0)
Total Lymphocyte: 27.3 %
WBC mixed population: 403 cells/uL (ref 200–950)
WBC: 6.1 10*3/uL (ref 3.8–10.8)

## 2017-02-02 LAB — TSH: TSH: 1.18 mIU/L (ref 0.40–4.50)

## 2017-02-02 LAB — MAGNESIUM: Magnesium: 2.1 mg/dL (ref 1.5–2.5)

## 2017-02-02 LAB — INSULIN, RANDOM: Insulin: 27 u[IU]/mL — ABNORMAL HIGH (ref 2.0–19.6)

## 2017-02-02 LAB — VITAMIN B12: Vitamin B-12: >2000 pg/mL — ABNORMAL HIGH (ref 200–1100)

## 2017-02-02 LAB — VITAMIN D 25 HYDROXY (VIT D DEFICIENCY, FRACTURES): Vit D, 25-Hydroxy: 70 ng/mL (ref 30–100)

## 2017-02-04 ENCOUNTER — Encounter: Payer: Self-pay | Admitting: Internal Medicine

## 2017-03-14 ENCOUNTER — Other Ambulatory Visit: Payer: Self-pay | Admitting: Internal Medicine

## 2017-03-30 DIAGNOSIS — H04123 Dry eye syndrome of bilateral lacrimal glands: Secondary | ICD-10-CM | POA: Diagnosis not present

## 2017-03-30 DIAGNOSIS — H2513 Age-related nuclear cataract, bilateral: Secondary | ICD-10-CM | POA: Diagnosis not present

## 2017-03-30 DIAGNOSIS — H35033 Hypertensive retinopathy, bilateral: Secondary | ICD-10-CM | POA: Diagnosis not present

## 2017-03-30 DIAGNOSIS — H25013 Cortical age-related cataract, bilateral: Secondary | ICD-10-CM | POA: Diagnosis not present

## 2017-03-30 LAB — HM DIABETES EYE EXAM

## 2017-04-04 ENCOUNTER — Encounter: Payer: Self-pay | Admitting: *Deleted

## 2017-04-19 ENCOUNTER — Ambulatory Visit: Payer: BLUE CROSS/BLUE SHIELD | Admitting: Adult Health

## 2017-04-19 ENCOUNTER — Encounter: Payer: Self-pay | Admitting: Adult Health

## 2017-04-19 VITALS — BP 124/76 | HR 79 | Temp 97.5°F | Ht 61.5 in | Wt 132.0 lb

## 2017-04-19 DIAGNOSIS — L237 Allergic contact dermatitis due to plants, except food: Secondary | ICD-10-CM

## 2017-04-19 MED ORDER — PREDNISONE 20 MG PO TABS: ORAL_TABLET | ORAL | 0 refills | Status: DC

## 2017-04-19 NOTE — Progress Notes (Signed)
Assessment and Plan:  Yuvia was seen today for poison oak.  Diagnoses and all orders for this visit:  Poison ivy dermatitis Can use benadryl, calamine lotion, contact precautions discussed.  Follow up as needed; present to ER for any sudden dyspnea, dizziness or other severe symptoms -     predniSONE (DELTASONE) 20 MG tablet; 3 tablets daily with food for 3 days, 2 tabs daily for 3 days, 1 tab a day for 5 days.  Further disposition pending results of labs. Discussed med's effects and SE's.   Over 15 minutes of exam, counseling, chart review, and critical decision making was performed.   Future Appointments  Date Time Provider [place]  04/19/2017  4:15 PM [name], NP [facility] None  05/17/2017 11:00 AM [name], NP [facility] None  08/08/2017  2:30 PM [name], MD [facility] None  02/28/2018 10:00 AM [name], MD [facility] None    ------------------------------------------------------------------------------------------------------------------   HPI BP 124/76   Pulse 79   Temp (!) 97.5 F (36.4 C)   Ht 5' 1.5" (1.562 m)   Wt 132 lb (59.9 kg)   SpO2 98%   BMI 24.54 kg/m   62 y.o.female presents for evaluation of blistering rash which came up 3 days ago after working in the yard with? Exposure to poison ivy. She has had exposure before, but presents with a particularly severe outbreak with large blisters and some spots on her face. The areas have erythematous base with large blisters with serous fluid. She has been applying calamine lotion, and applying ice which is somewhat helpful but was concerned due to very large blisters.   Past Medical History:  Diagnosis Date  . Allergy   . Arthritis    BACK   . Cancer (HCC)    UTERINE   . Cataract    MILD  . GERD (gastroesophageal reflux disease)   . Hyperlipidemia   . Hypertension   . Iritis    HLA B27 +  . Prediabetes   . S/P ORIF (open reduction internal fixation) fracture 1994   Right tib/fib  . Thyroiditis, subacute   . Vitamin D deficiency      No Active Allergies  Current Outpatient Medications on File Prior to Visit  Medication Sig  . BIOTIN PO Take 5,000 mg elemental calcium/kg/hr by mouth daily.  Marland Kitchen CALCIUM PO Take 600 mg by mouth daily.  . cetirizine (ZYRTEC) 10 MG tablet Take 10 mg by mouth daily.  . Cholecalciferol (VITAMIN D PO) Take 5,000 Int'l Units by mouth daily.  . enalapril (VASOTEC) 20 MG tablet TAKE 1 TABLET BY MOUTH ONCE DAILY  . Flaxseed, Linseed, (FLAX SEED OIL PO) Take 4,800 mg by mouth daily.   Marland Kitchen MAGNESIUM PO Take 250 mg by mouth daily.  . Multiple Vitamin (MULTIVITAMIN) capsule Take 1 capsule by mouth daily.  . simvastatin (ZOCOR) 40 MG tablet TAKE ONE-HALF TO ONE TABLET BY MOUTH EVERY NIGHT AT BEDTIME FOR CHOLESTEROL   No current facility-administered medications on file prior to visit.     ROS: Review of Systems  Constitutional: Negative for chills and fever.  HENT: Negative.   Eyes: Negative.  Negative for blurred vision and double vision.  Respiratory: Negative for cough, shortness of breath and wheezing.   Cardiovascular: Negative for chest pain, palpitations and leg swelling.  Gastrointestinal: Negative for nausea and vomiting.  Musculoskeletal: Negative.   Skin: Positive for itching and rash.  Neurological: Negative for dizziness, sensory change and headaches.    Physical Exam:  BP 124/76   Pulse 79   Temp (!) 97.5 F (36.4 C)   Ht 5' 1.5" (1.562 m)   Wt 132 lb (59.9 kg)   SpO2 98%   BMI 24.54 kg/m   General Appearance: Well nourished, in no apparent distress. Eyes: PERRLA, EOMs, conjunctiva no swelling or erythema ENT/Mouth:  No erythema, swelling, or exudate on post pharynx.  Tonsils not swollen or erythematous. Hearing normal.  Neck: Supple.  Respiratory: Respiratory effort normal, BS equal bilaterally without rales, rhonchi, wheezing or stridor.  Cardio: RRR with no MRGs. Brisk peripheral pulses without  edema.  Musculoskeletal: normal gait.  Skin: Warm, dry; scattered erythematous rash with multiple bullae to forearms with serous fluid, very mild area to left cheek and forehead.  Psych: Awake and oriented X 3, normal affect, Insight and Judgment appropriate.    Izora Ribas, NP 4:10 PM [facility]

## 2017-04-19 NOTE — Patient Instructions (Signed)
Poison Ivy Dermatitis Poison ivy dermatitis is inflammation of the skin that is caused by the allergens on the leaves of the poison ivy plant. The skin reaction often involves redness, swelling, blisters, and extreme itching. What are the causes? This condition is caused by a specific chemical (urushiol) found in the sap of the poison ivy plant. This chemical is sticky and can be easily spread to people, animals, and objects. You can get poison ivy dermatitis by:  Having direct contact with a poison ivy plant.  Touching animals, other people, or objects that have come in contact with poison ivy and have the chemical on them.  What increases the risk? This condition is more likely to develop in:  People who are outdoors often.  People who go outdoors without wearing protective clothing, such as closed shoes, long pants, and a long-sleeved shirt.  What are the signs or symptoms? Symptoms of this condition include:  Redness and itching.  A rash that often includes bumps and blisters. The rash usually appears 48 hours after exposure.  Swelling. This may occur if the reaction is more severe.  Symptoms usually last for 1-2 weeks. However, the first time you develop this condition, symptoms may last 3-4 weeks. How is this diagnosed? This condition may be diagnosed based on your symptoms and a physical exam. Your health care provider may also ask you about any recent outdoor activity. How is this treated? Treatment for this condition will vary depending on how severe it is. Treatment may include:  Hydrocortisone creams or calamine lotions to relieve itching.  Oatmeal baths to soothe the skin.  Over-the-counter antihistamine tablets.  Oral steroid medicine for more severe outbreaks.  Follow these instructions at home:  Take or apply over-the-counter and prescription medicines only as told by your health care provider.  Wash exposed skin as soon as possible with soap and cold  water.  Use hydrocortisone creams or calamine lotion as needed to soothe the skin and relieve itching.  Take oatmeal baths as needed. Use colloidal oatmeal. You can get this at your local pharmacy or grocery store. Follow the instructions on the packaging.  Do not scratch or rub your skin.  While you have the rash, wash clothes right after you wear them. How is this prevented?  Learn to identify the poison ivy plant and avoid contact with the plant. This plant can be recognized by the number of leaves. Generally, poison ivy has three leaves with flowering branches on a single stem. The leaves are typically glossy, and they have jagged edges that come to a point at the front.  If you have been exposed to poison ivy, thoroughly wash with soap and water right away. You have about 30 minutes to remove the plant resin before it will cause the rash. Be sure to wash under your fingernails because any plant resin there will continue to spread the rash.  When hiking or camping, wear clothes that will help you to avoid exposure on the skin. This includes long pants, a long-sleeved shirt, tall socks, and hiking boots. You can also apply preventive lotion to your skin to help limit exposure.  If you suspect that your clothes or outdoor gear came in contact with poison ivy, rinse them off outside with a garden hose before you bring them inside your house. Contact a health care provider if:  You have open sores in the rash area.  You have more redness, swelling, or pain in the affected area.  You have  redness that spreads beyond the rash area.  You have fluid, blood, or pus coming from the affected area.  You have a fever.  You have a rash over a large area of your body.  You have a rash on your eyes, mouth, or genitals.  Your rash does not improve after a few days. Get help right away if:  Your face swells or your eyes swell shut.  You have trouble breathing.  You have trouble  swallowing. This information is not intended to replace advice given to you by your health care provider. Make sure you discuss any questions you have with your health care provider. Document Released: 12/31/1999 Document Revised: 06/10/2015 Document Reviewed: 06/10/2014 Elsevier Interactive Patient Education  [employer].

## 2017-04-21 ENCOUNTER — Other Ambulatory Visit: Payer: Self-pay | Admitting: Internal Medicine

## 2017-04-24 ENCOUNTER — Other Ambulatory Visit: Payer: Self-pay | Admitting: Internal Medicine

## 2017-04-24 MED ORDER — PREDNISONE 20 MG PO TABS: ORAL_TABLET | ORAL | 1 refills | Status: DC

## 2017-05-09 ENCOUNTER — Ambulatory Visit: Payer: Self-pay | Admitting: Adult Health

## 2017-05-17 ENCOUNTER — Ambulatory Visit: Payer: BLUE CROSS/BLUE SHIELD | Admitting: Adult Health

## 2017-05-17 ENCOUNTER — Encounter: Payer: Self-pay | Admitting: Adult Health

## 2017-05-17 VITALS — BP 122/74 | HR 81 | Temp 97.3°F | Ht 61.5 in | Wt 129.0 lb

## 2017-05-17 DIAGNOSIS — R7303 Prediabetes: Secondary | ICD-10-CM | POA: Diagnosis not present

## 2017-05-17 DIAGNOSIS — Z79899 Other long term (current) drug therapy: Secondary | ICD-10-CM | POA: Diagnosis not present

## 2017-05-17 DIAGNOSIS — E559 Vitamin D deficiency, unspecified: Secondary | ICD-10-CM

## 2017-05-17 DIAGNOSIS — I10 Essential (primary) hypertension: Secondary | ICD-10-CM | POA: Diagnosis not present

## 2017-05-17 DIAGNOSIS — E782 Mixed hyperlipidemia: Secondary | ICD-10-CM | POA: Diagnosis not present

## 2017-05-17 LAB — LIPID PANEL
Cholesterol: 156 mg/dL (ref <200)
HDL: 37 mg/dL — AB (ref >50)
LDL Cholesterol (Calc): 86 mg/dL (calc)
NON-HDL CHOLESTEROL (CALC): 119 mg/dL (ref <130)
Total CHOL/HDL Ratio: 4.2 (calc) (ref <5.0)
Triglycerides: 248 mg/dL — ABNORMAL HIGH (ref <150)

## 2017-05-17 LAB — CBC WITH DIFFERENTIAL/PLATELET
Basophils Absolute: 50 cells/uL (ref 0–200)
Basophils Relative: 0.9 %
Eosinophils Absolute: 112 cells/uL (ref 15–500)
Eosinophils Relative: 2 %
HEMATOCRIT: 40.2 % (ref 35.0–45.0)
HEMOGLOBIN: 13.8 g/dL (ref 11.7–15.5)
LYMPHS ABS: 1551 {cells}/uL (ref 850–3900)
MCH: 30.4 pg (ref 27.0–33.0)
MCHC: 34.3 g/dL (ref 32.0–36.0)
MCV: 88.5 fL (ref 80.0–100.0)
MPV: 9.7 fL (ref 7.5–12.5)
Monocytes Relative: 6.8 %
NEUTROS ABS: 3506 {cells}/uL (ref 1500–7800)
NEUTROS PCT: 62.6 %
Platelets: 261 10*3/uL (ref 140–400)
RBC: 4.54 10*6/uL (ref 3.80–5.10)
RDW: 12.4 % (ref 11.0–15.0)
Total Lymphocyte: 27.7 %
WBC: 5.6 10*3/uL (ref 3.8–10.8)
WBCMIX: 381 {cells}/uL (ref 200–950)

## 2017-05-17 LAB — COMPLETE METABOLIC PANEL WITH GFR
AG RATIO: 2.1 (calc) (ref 1.0–2.5)
ALBUMIN MSPROF: 4.7 g/dL (ref 3.6–5.1)
ALT: 19 U/L (ref 6–29)
AST: 20 U/L (ref 10–35)
Alkaline phosphatase (APISO): 111 U/L (ref 33–130)
BUN: 14 mg/dL (ref 7–25)
CALCIUM: 10.7 mg/dL — AB (ref 8.6–10.4)
CO2: 28 mmol/L (ref 20–32)
CREATININE: 0.76 mg/dL (ref 0.50–0.99)
Chloride: 105 mmol/L (ref 98–110)
GFR, EST AFRICAN AMERICAN: 98 mL/min/{1.73_m2} (ref >=60)
GFR, EST NON AFRICAN AMERICAN: 85 mL/min/{1.73_m2} (ref >=60)
GLOBULIN: 2.2 g/dL (ref 1.9–3.7)
Glucose, Bld: 100 mg/dL — ABNORMAL HIGH (ref 65–99)
Potassium: 4.8 mmol/L (ref 3.5–5.3)
SODIUM: 140 mmol/L (ref 135–146)
TOTAL PROTEIN: 6.9 g/dL (ref 6.1–8.1)
Total Bilirubin: 0.5 mg/dL (ref 0.2–1.2)

## 2017-05-17 LAB — TSH: TSH: 0.75 mIU/L (ref 0.40–4.50)

## 2017-05-17 NOTE — Patient Instructions (Signed)
Aim for 7+ servings of fruits and vegetables daily  80+ fluid ounces of water or unsweet tea for healthy kidneys  Limit alcohol to max 1 drink a day  Limit animal fats in diet for cholesterol and heart health - choose grass fed whenever available  Aim for low stress - take time to unwind and care for your mental health  Aim for 150 min of moderate intensity exercise weekly for heart health, and weights twice weekly for bone health  Aim for 7-9 hours of sleep daily

## 2017-05-17 NOTE — Progress Notes (Signed)
FOLLOW UP  Assessment and Plan:   Hypertension Well controlled with current medications  Monitor blood pressure at home; patient to call if consistently greater than 130/80 Continue DASH diet.   Reminder to go to the ER if any CP, SOB, nausea, dizziness, severe HA, changes vision/speech, left arm numbness and tingling and jaw pain.  Cholesterol Currently very near goal; continue statin  Continue low cholesterol diet and exercise.  Check lipid panel.   Prediabetes Continue diet and exercise.  Perform daily foot/skin check, notify office of any concerning changes.  Check A1C  BMi 23 Continue to recommend diet heavy in fruits and veggies and low in animal meats, cheeses, and dairy products, appropriate calorie intake Discuss exercise recommendations routinely Continue to monitor weight at each visit   Vitamin D Def At goal at last visit; continue supplementation to maintain goal of 70-100 Defer Vit D level  Continue diet and meds as discussed. Further disposition pending results of labs. Discussed med's effects and SE's.   Over 30 minutes of exam, counseling, chart review, and critical decision making was performed.   Future Appointments  Date Time Provider [place]  08/08/2017  2:30 PM [name], MD [facility] None  02/28/2018 10:00 AM [name], MD [facility] None    ----------------------------------------------------------------------------------------------------------------------  HPI 62 y.o. female  presents for 3 month follow up on hypertension, cholesterol, prediabetes, weight and vitamin D deficiency.   BMI is Body mass index is 23.98 kg/m., she has been working on diet and exercise. Wt Readings from Last 3 Encounters:  05/17/17 129 lb (58.5 kg)  04/19/17 132 lb (59.9 kg)  02/01/17 131 lb 12.8 oz (59.8 kg)   Her blood pressure has been controlled at home, today their BP is BP: 122/74  She does workout. She denies chest pain, shortness  of breath, dizziness.   She is on cholesterol medication (simvastatin 20 mg daily) and denies myalgias. Her cholesterol is not at goal. The cholesterol last visit was:   Lab Results  Component Value Date   CHOL 172 02/01/2017   HDL 44 (L) 02/01/2017   LDLCALC 101 (H) 02/01/2017   TRIG 163 (H) 02/01/2017   CHOLHDL 3.9 02/01/2017    She has been working on diet and exercise for prediabetes, and denies foot ulcerations, increased appetite, nausea, paresthesia of the feet, polydipsia, polyuria, visual disturbances, vomiting and weight loss. Last A1C in the office was:  Lab Results  Component Value Date   HGBA1C 5.8 (H) 02/01/2017   Patient is on Vitamin D supplement.   Lab Results  Component Value Date   VD25OH 12 02/01/2017        Current Medications:  Current Outpatient Medications on File Prior to Visit  Medication Sig  . BIOTIN PO Take 5,000 mg elemental calcium/kg/hr by mouth daily.  Marland Kitchen CALCIUM PO Take 600 mg by mouth daily.  . cetirizine (ZYRTEC) 10 MG tablet Take 10 mg by mouth daily.  . Cholecalciferol (VITAMIN D PO) Take 5,000 Int'l Units by mouth daily.  . enalapril (VASOTEC) 20 MG tablet TAKE 1 TABLET BY MOUTH ONCE DAILY  . Flaxseed, Linseed, (FLAX SEED OIL PO) Take 4,800 mg by mouth daily.   Marland Kitchen MAGNESIUM PO Take 250 mg by mouth daily.  . Multiple Vitamin (MULTIVITAMIN) capsule Take 1 capsule by mouth daily.  . simvastatin (ZOCOR) 40 MG tablet TAKE ONE-HALF TO ONE TABLET BY MOUTH EVERY NIGHT AT BEDTIME FOR CHOLESTEROL  . predniSONE (DELTASONE) 20 MG tablet 1 tab 3 x day for  3 days, then 1 tab 2 x day for 3 days, then 1 tab 1 x day for 5 days   No current facility-administered medications on file prior to visit.      Allergies: No Active Allergies   Medical History:  Past Medical History:  Diagnosis Date  . Allergy   . Arthritis    BACK   . Cancer (HCC)    UTERINE   . Cataract    MILD  . GERD (gastroesophageal reflux disease)   . Hyperlipidemia   .  Hypertension   . Iritis    HLA B27 +  . Prediabetes   . S/P ORIF (open reduction internal fixation) fracture 1994   Right tib/fib  . Thyroiditis, subacute   . Vitamin D deficiency    Family history- Reviewed and unchanged Social history- Reviewed and unchanged   Review of Systems:  Review of Systems  Constitutional: Negative for malaise/fatigue and weight loss.  HENT: Negative for hearing loss and tinnitus.   Eyes: Negative for blurred vision and double vision.  Respiratory: Negative for cough, shortness of breath and wheezing.   Cardiovascular: Negative for chest pain, palpitations, orthopnea, claudication and leg swelling.  Gastrointestinal: Negative for abdominal pain, blood in stool, constipation, diarrhea, heartburn, melena, nausea and vomiting.  Genitourinary: Negative.   Musculoskeletal: Negative for joint pain and myalgias.  Skin: Negative for rash.  Neurological: Negative for dizziness, tingling, sensory change, weakness and headaches.  Endo/Heme/Allergies: Positive for environmental allergies. Negative for polydipsia.  Psychiatric/Behavioral: Negative.   All other systems reviewed and are negative.     Physical Exam: BP 122/74   Pulse 81   Temp (!) 97.3 F (36.3 C)   Ht 5' 1.5" (1.562 m)   Wt 129 lb (58.5 kg)   SpO2 98%   BMI 23.98 kg/m  Wt Readings from Last 3 Encounters:  05/17/17 129 lb (58.5 kg)  04/19/17 132 lb (59.9 kg)  02/01/17 131 lb 12.8 oz (59.8 kg)   General Appearance: Well nourished, in no apparent distress. Eyes: PERRLA, EOMs, conjunctiva no swelling or erythema Sinuses: No Frontal/maxillary tenderness ENT/Mouth: Ext aud canals clear, TMs without erythema, bulging. No erythema, swelling, or exudate on post pharynx.  Tonsils not swollen or erythematous. Hearing normal.  Neck: Supple, thyroid normal.  Respiratory: Respiratory effort normal, BS equal bilaterally without rales, rhonchi, wheezing or stridor.  Cardio: RRR with no MRGs. Brisk  peripheral pulses without edema.  Abdomen: Soft, + BS.  Non tender, no guarding, rebound, hernias, masses. Lymphatics: Non tender without lymphadenopathy.  Musculoskeletal: Full ROM, 5/5 strength, Normal gait Skin: Warm, dry without rashes, lesions, ecchymosis.  Neuro: Cranial nerves intact. No cerebellar symptoms.  Psych: Awake and oriented X 3, normal affect, Insight and Judgment appropriate.    Izora Ribas, NP 11:35 AM [facility]

## 2017-05-18 LAB — HEMOGLOBIN A1C
HEMOGLOBIN A1C: 5.5 %{Hb} (ref <5.7)
MEAN PLASMA GLUCOSE: 111 (calc)
eAG (mmol/L): 6.2 (calc)

## 2017-06-06 DIAGNOSIS — Z1231 Encounter for screening mammogram for malignant neoplasm of breast: Secondary | ICD-10-CM | POA: Diagnosis not present

## 2017-06-06 DIAGNOSIS — Z6824 Body mass index (BMI) 24.0-24.9, adult: Secondary | ICD-10-CM | POA: Diagnosis not present

## 2017-06-06 DIAGNOSIS — Z01419 Encounter for gynecological examination (general) (routine) without abnormal findings: Secondary | ICD-10-CM | POA: Diagnosis not present

## 2017-07-24 ENCOUNTER — Other Ambulatory Visit: Payer: Self-pay | Admitting: Internal Medicine

## 2017-08-08 ENCOUNTER — Ambulatory Visit: Payer: Self-pay | Admitting: Internal Medicine

## 2017-08-21 ENCOUNTER — Ambulatory Visit (INDEPENDENT_AMBULATORY_CARE_PROVIDER_SITE_OTHER): Payer: BLUE CROSS/BLUE SHIELD | Admitting: Internal Medicine

## 2017-08-21 ENCOUNTER — Encounter: Payer: Self-pay | Admitting: Internal Medicine

## 2017-08-21 VITALS — BP 132/80 | HR 76 | Temp 97.2°F | Resp 16 | Ht 61.5 in | Wt 126.4 lb

## 2017-08-21 DIAGNOSIS — Z79899 Other long term (current) drug therapy: Secondary | ICD-10-CM | POA: Diagnosis not present

## 2017-08-21 DIAGNOSIS — E559 Vitamin D deficiency, unspecified: Secondary | ICD-10-CM

## 2017-08-21 DIAGNOSIS — I10 Essential (primary) hypertension: Secondary | ICD-10-CM | POA: Diagnosis not present

## 2017-08-21 DIAGNOSIS — E782 Mixed hyperlipidemia: Secondary | ICD-10-CM

## 2017-08-21 DIAGNOSIS — R7303 Prediabetes: Secondary | ICD-10-CM | POA: Diagnosis not present

## 2017-08-21 NOTE — Progress Notes (Signed)
This very nice 62 y.o. MWF presents for 6 month follow up with HTN, HLD, Pre-Diabetes and Vitamin D Deficiency.      Patient is treated for HTN (2010) & BP has been controlled at home. Today's BP is at goal - 132/80. Patient has had no complaints of any cardiac type chest pain, palpitations, dyspnea / orthopnea / PND, dizziness, claudication, or dependent edema.     Hyperlipidemia is controlled with diet & meds. Patient denies myalgias or other med SE's. Last Lipids were at goal albeit elevated Trig's: Lab Results  Component Value Date   CHOL 156 05/17/2017   HDL 37 (L) 05/17/2017   LDLCALC 86 05/17/2017   TRIG 248 (H) 05/17/2017   CHOLHDL 4.2 05/17/2017      Also, the patient has history of PreDiabetes (A1c 5.8%/2014) and has had no symptoms of reactive hypoglycemia, diabetic polys, paresthesias or visual blurring.  Last A1c was Normal & at goal:  Lab Results  Component Value Date   HGBA1C 5.5 05/17/2017      Further, the patient also has history of Vitamin D Deficiency and supplements vitamin D without any suspected side-effects. Last vitamin D was at goal:  Lab Results  Component Value Date   VD25OH 32 02/01/2017   Current Outpatient Medications on File Prior to Visit  Medication Sig  . BIOTIN PO Take 5,000 mg elemental calcium/kg/hr by mouth daily.  Marland Kitchen CALCIUM PO Take 600 mg by mouth daily.  . cetirizine (ZYRTEC) 10 MG tablet Take 10 mg by mouth daily.  . Cholecalciferol (VITAMIN D PO) Take 5,000 Int'l Units by mouth daily.  . enalapril (VASOTEC) 20 MG tablet TAKE 1 TABLET BY MOUTH ONCE DAILY  . Flaxseed, Linseed, (FLAX SEED OIL PO) Take 4,800 mg by mouth daily.   Marland Kitchen MAGNESIUM PO Take 250 mg by mouth daily.  . Multiple Vitamin (MULTIVITAMIN) capsule Take 1 capsule by mouth daily.  . simvastatin (ZOCOR) 40 MG tablet TAKE 1/2 TO 1 TABLET BY MOUTH EVERY NIGHT AT BEDTIME FOR CHOLESTEROL   No current facility-administered medications on file prior to visit.    No Active  Allergies   PMHx:   Past Medical History:  Diagnosis Date  . Allergy   . Arthritis    BACK   . Cancer (HCC)    UTERINE   . Cataract    MILD  . GERD (gastroesophageal reflux disease)   . Hyperlipidemia   . Hypertension   . Iritis    HLA B27 +  . Prediabetes   . S/P ORIF (open reduction internal fixation) fracture 1994   Right tib/fib  . Thyroiditis, subacute   . Vitamin D deficiency    Immunization History  Administered Date(s) Administered  . PPD Test 09/30/2013  . Pneumococcal-Unspecified 08/27/2008  . Tdap 07/18/2007  . Zoster 12/29/2015   Past Surgical History:  Procedure Laterality Date  . CESAREAN SECTION    . COLONOSCOPY  03/17/2005   HEMS   . KNEE ARTHROSCOPY Left 1972  . right leg repair  1994   rod   . ROBOTIC ASSISTED TOTAL HYSTERECTOMY  2015   pt. states she had robot assisted hysterectomy and uterus was removed abdominally due to scar tissue.  she also states that both tubes and ovaries were removed.  . STAPEDECTOMY Left 2003  . TONSILLECTOMY AND ADENOIDECTOMY     FHx:    Reviewed / unchanged  SHx:    Reviewed / unchanged   Systems Review:  Constitutional: Denies  fever, chills, wt changes, headaches, insomnia, fatigue, night sweats, change in appetite. Eyes: Denies redness, blurred vision, diplopia, discharge, itchy, watery eyes.  ENT: Denies discharge, congestion, post nasal drip, epistaxis, sore throat, earache, hearing loss, dental pain, tinnitus, vertigo, sinus pain, snoring.  CV: Denies chest pain, palpitations, irregular heartbeat, syncope, dyspnea, diaphoresis, orthopnea, PND, claudication or edema. Respiratory: denies cough, dyspnea, DOE, pleurisy, hoarseness, laryngitis, wheezing.  Gastrointestinal: Denies dysphagia, odynophagia, heartburn, reflux, water brash, abdominal pain or cramps, nausea, vomiting, bloating, diarrhea, constipation, hematemesis, melena, hematochezia  or hemorrhoids. Genitourinary: Denies dysuria, frequency, urgency,  nocturia, hesitancy, discharge, hematuria or flank pain. Musculoskeletal: Denies arthralgias, myalgias, stiffness, jt. swelling, pain, limping or strain/sprain.  Skin: Denies pruritus, rash, hives, warts, acne, eczema or change in skin lesion(s). Neuro: No weakness, tremor, incoordination, spasms, paresthesia or pain. Psychiatric: Denies confusion, memory loss or sensory loss. Endo: Denies change in weight, skin or hair change.  Heme/Lymph: No excessive bleeding, bruising or enlarged lymph nodes.  Physical Exam  BP 132/80   Pulse 76   Temp (!) 97.2 F (36.2 C)   Resp 16   Ht 5' 1.5" (1.562 m)   Wt 126 lb 6.4 oz (57.3 kg)   BMI 23.50 kg/m   Appears  well nourished, well groomed  and in no distress.  Eyes: PERRLA, EOMs, conjunctiva no swelling or erythema. Sinuses: No frontal/maxillary tenderness ENT/Mouth: EAC's clear, TM's nl w/o erythema, bulging. Nares clear w/o erythema, swelling, exudates. Oropharynx clear without erythema or exudates. Oral hygiene is good. Tongue normal, non obstructing. Hearing intact.  Neck: Supple. Thyroid not palpable. Car 2+/2+ without bruits, nodes or JVD. Chest: Respirations nl with BS clear & equal w/o rales, rhonchi, wheezing or stridor.  Cor: Heart sounds normal w/ regular rate and rhythm without sig. murmurs, gallops, clicks or rubs. Peripheral pulses normal and equal  without edema.  Abdomen: Soft & bowel sounds normal. Non-tender w/o guarding, rebound, hernias, masses or organomegaly.  Lymphatics: Unremarkable.  Musculoskeletal: Full ROM all peripheral extremities, joint stability, 5/5 strength and normal gait.  Skin: Warm, dry without exposed rashes, lesions or ecchymosis apparent.  Neuro: Cranial nerves intact, reflexes equal bilaterally. Sensory-motor testing grossly intact. Tendon reflexes grossly intact.  Pysch: Alert & oriented x 3.  Insight and judgement nl & appropriate. No ideations.  Assessment and Plan:  1. Essential hypertension  -  Continue medication, monitor blood pressure at home.  - Continue DASH diet.  Reminder to go to the ER if any CP,  SOB, nausea, dizziness, severe HA, changes vision/speech.  - CBC with Differential/Platelet - COMPLETE METABOLIC PANEL WITH GFR - Magnesium - TSH  2. Hyperlipidemia, mixed  - Continue diet/meds, exercise,& lifestyle modifications.  - Continue monitor periodic cholesterol/liver & renal functions   - Lipid panel - TSH  3. Prediabetes  - Hemoglobin A1c - Insulin, random  4. Vitamin D deficiency  - Continue diet, exercise, lifestyle modifications.  - Monitor appropriate labs.                                                                              tinue supplementation.  - VITAMIN D 25 Hydroxyl  5. Medication management  - CBC with Differential/Platelet - COMPLETE METABOLIC PANEL  WITH GFR - Magnesium - Lipid panel - TSH - Hemoglobin A1c - Insulin, random - VITAMIN D 25 Hydroxyl       Discussed  regular exercise, BP monitoring, weight control to achieve/maintain BMI less than 25 and discussed med and SE's. Recommended labs to assess and monitor clinical status with further disposition pending results of labs. Over 30 minutes of exam, counseling, chart review was performed.

## 2017-08-21 NOTE — Patient Instructions (Signed)

## 2017-08-22 LAB — COMPLETE METABOLIC PANEL WITH GFR
AG RATIO: 2.3 (calc) (ref 1.0–2.5)
ALT: 12 U/L (ref 6–29)
AST: 15 U/L (ref 10–35)
Albumin: 4.8 g/dL (ref 3.6–5.1)
Alkaline phosphatase (APISO): 104 U/L (ref 33–130)
BILIRUBIN TOTAL: 0.4 mg/dL (ref 0.2–1.2)
BUN: 15 mg/dL (ref 7–25)
CHLORIDE: 104 mmol/L (ref 98–110)
CO2: 25 mmol/L (ref 20–32)
Calcium: 9.9 mg/dL (ref 8.6–10.4)
Creat: 0.77 mg/dL (ref 0.50–0.99)
GFR, Est African American: 97 mL/min/{1.73_m2} (ref >=60)
GFR, Est Non African American: 83 mL/min/{1.73_m2} (ref >=60)
GLOBULIN: 2.1 g/dL (ref 1.9–3.7)
Glucose, Bld: 85 mg/dL (ref 65–99)
Potassium: 4.2 mmol/L (ref 3.5–5.3)
SODIUM: 139 mmol/L (ref 135–146)
Total Protein: 6.9 g/dL (ref 6.1–8.1)

## 2017-08-22 LAB — CBC WITH DIFFERENTIAL/PLATELET
BASOS ABS: 67 {cells}/uL (ref 0–200)
Basophils Relative: 1.2 %
EOS PCT: 1.6 %
Eosinophils Absolute: 90 cells/uL (ref 15–500)
HCT: 39.6 % (ref 35.0–45.0)
Hemoglobin: 13.5 g/dL (ref 11.7–15.5)
Lymphs Abs: 2486 cells/uL (ref 850–3900)
MCH: 30.1 pg (ref 27.0–33.0)
MCHC: 34.1 g/dL (ref 32.0–36.0)
MCV: 88.2 fL (ref 80.0–100.0)
MONOS PCT: 6.7 %
MPV: 10 fL (ref 7.5–12.5)
NEUTROS ABS: 2582 {cells}/uL (ref 1500–7800)
Neutrophils Relative %: 46.1 %
PLATELETS: 243 10*3/uL (ref 140–400)
RBC: 4.49 10*6/uL (ref 3.80–5.10)
RDW: 12 % (ref 11.0–15.0)
Total Lymphocyte: 44.4 %
WBC mixed population: 375 cells/uL (ref 200–950)
WBC: 5.6 10*3/uL (ref 3.8–10.8)

## 2017-08-22 LAB — HEMOGLOBIN A1C
HEMOGLOBIN A1C: 5.6 %{Hb} (ref <5.7)
MEAN PLASMA GLUCOSE: 114 (calc)
eAG (mmol/L): 6.3 (calc)

## 2017-08-22 LAB — LIPID PANEL
Cholesterol: 147 mg/dL (ref <200)
HDL: 39 mg/dL — ABNORMAL LOW (ref >50)
LDL CHOLESTEROL (CALC): 75 mg/dL
Non-HDL Cholesterol (Calc): 108 mg/dL (calc) (ref <130)
Total CHOL/HDL Ratio: 3.8 (calc) (ref <5.0)
Triglycerides: 254 mg/dL — ABNORMAL HIGH (ref <150)

## 2017-08-22 LAB — INSULIN, RANDOM: INSULIN: 6.4 u[IU]/mL (ref 2.0–19.6)

## 2017-08-22 LAB — VITAMIN D 25 HYDROXY (VIT D DEFICIENCY, FRACTURES): Vit D, 25-Hydroxy: 92 ng/mL (ref 30–100)

## 2017-08-22 LAB — MAGNESIUM: MAGNESIUM: 2.1 mg/dL (ref 1.5–2.5)

## 2017-08-22 LAB — TSH: TSH: 1.04 mIU/L (ref 0.40–4.50)

## 2017-09-18 ENCOUNTER — Other Ambulatory Visit: Payer: Self-pay | Admitting: Internal Medicine

## 2017-11-27 ENCOUNTER — Ambulatory Visit: Payer: Self-pay | Admitting: Physician Assistant

## 2017-12-03 ENCOUNTER — Ambulatory Visit (INDEPENDENT_AMBULATORY_CARE_PROVIDER_SITE_OTHER): Payer: BLUE CROSS/BLUE SHIELD

## 2017-12-03 VITALS — Temp 97.4°F

## 2017-12-03 DIAGNOSIS — Z23 Encounter for immunization: Secondary | ICD-10-CM | POA: Diagnosis not present

## 2018-01-22 ENCOUNTER — Other Ambulatory Visit: Payer: Self-pay | Admitting: Internal Medicine

## 2018-02-28 ENCOUNTER — Encounter: Payer: Self-pay | Admitting: Internal Medicine

## 2018-03-13 ENCOUNTER — Other Ambulatory Visit: Payer: Self-pay | Admitting: Adult Health

## 2018-03-31 ENCOUNTER — Encounter: Payer: Self-pay | Admitting: Internal Medicine

## 2018-03-31 NOTE — Patient Instructions (Signed)

## 2018-03-31 NOTE — Progress Notes (Signed)
[facility] [name], M.D.     [name]. [name], P.A.-C [name], [name] [address] [place], [place] [address] Telephone [phone] Telefax [phone] Annual Screening/Preventative Visit & Comprehensive Evaluation &  Examination     This very nice 63 y.o. MWF presents for a Screening /Preventative Visit & comprehensive evaluation and management of multiple medical co-morbidities.  Patient has been followed for HTN, HLD, Prediabetes  and Vitamin D Deficiency.      HTN predates  Circa 2010. Patient's BP has been controlled at home and patient denies any cardiac symptoms as chest pain, palpitations, shortness of breath, dizziness or ankle swelling. Today's BP is at goal - 124/80.      Patient's hyperlipidemia is controlled with diet and Simvastatin. Patient denies myalgias or other medication SE's. Last lipids were at goal albeit elevated Trig's: Lab Results  Component Value Date   CHOL 147 08/21/2017   HDL 39 (L) 08/21/2017   LDLCALC 75 08/21/2017   TRIG 254 (H) 08/21/2017   CHOLHDL 3.8 08/21/2017      Patient has hx/o prediabetes predating  (A1c 5.8% / 2014) and patient denies reactive hypoglycemic symptoms, visual blurring, diabetic polys or paresthesias. Last A1c was Normal & at goal: Lab Results  Component Value Date   HGBA1C 5.6 08/21/2017      Finally, patient has history of Vitamin D Deficiency and last Vitamin D was at goal: Lab Results  Component Value Date   VD25OH 92 08/21/2017   Current Outpatient Medications on File Prior to Visit  Medication Sig  . aspirin EC 81 MG tablet Take 81 mg by mouth daily.  Marland Kitchen BIOTIN PO Take 5,000 mg elemental calcium/kg/hr by mouth daily.  Marland Kitchen CALCIUM PO Take 600 mg by mouth daily.  . cetirizine (ZYRTEC) 10 MG tablet Take 10 mg by mouth daily.  . Cholecalciferol (VITAMIN D PO) Take 5,000 Int'l Units by mouth daily.  . enalapril (VASOTEC) 20 MG  tablet TAKE 1 TABLET BY MOUTH ONCE DAILY  . Flaxseed, Linseed, (FLAX SEED OIL PO) Take 4,800 mg by mouth daily.   Marland Kitchen MAGNESIUM PO Take 250 mg by mouth daily.  . Multiple Vitamin (MULTIVITAMIN) capsule Take 1 capsule by mouth daily.  . simvastatin (ZOCOR) 40 MG tablet TAKE ONE-HALF TO ONE TABLET BY MOUTH EVERY NIGHT AT BEDTIME FOR CHOLESTEROL   No current facility-administered medications on file prior to visit.    No Active Allergies   Past Medical History:  Diagnosis Date  . Allergy   . Arthritis    BACK   . Cancer (HCC)    UTERINE   . Cataract    MILD  . GERD (gastroesophageal reflux disease)   . Hyperlipidemia   . Hypertension   . Iritis    HLA B27 +  . Prediabetes   . S/P ORIF (open reduction internal fixation) fracture 1994   Right tib/fib  . Thyroiditis, subacute   . Vitamin D deficiency    Health Maintenance  Topic Date Due  . TETANUS/TDAP  07/17/2017  . MAMMOGRAM  03/22/2018  . PAP SMEAR-Modifier  03/25/2019  . COLONOSCOPY  05/10/2026  . INFLUENZA VACCINE  Completed  . Hepatitis C Screening  Completed  . HIV Screening  Completed   Immunization History  Administered Date(s) Administered  . Influenza Inj Mdck Quad With Preservative 12/03/2017  . PPD Test 09/30/2013  . Pneumococcal-Unspecified 08/27/2008  . Tdap 07/18/2007  . Zoster 12/29/2015   Last Colon -  05/09/2016 - Dr [name] -recc 10 yr f/u.  Last MGM - 2019 at Dr [name] office  Past Surgical History:  Procedure Laterality Date  . CESAREAN SECTION    . COLONOSCOPY  03/17/2005   HEMS   . KNEE ARTHROSCOPY Left 1972  . right leg repair  1994   rod   . ROBOTIC ASSISTED TOTAL HYSTERECTOMY  2015   pt. states she had robot assisted hysterectomy and uterus was removed abdominally due to scar tissue.  she also states that both tubes and ovaries were removed.  . STAPEDECTOMY Left 2003  . TONSILLECTOMY AND ADENOIDECTOMY     Family History  Problem Relation Age of Onset  . Cancer Mother         uterine, kidney  . Hypertension Mother   . Colon polyps Mother   . Stomach cancer Mother   . Diabetes Father   . Hypertension Father   . Stroke Father   . Cancer Father        kidney  . Colon cancer Maternal Grandmother   . Colon cancer Maternal Grandfather   . Esophageal cancer Neg Hx   . Rectal cancer Neg Hx    Social History   Tobacco Use  . Smoking status: Never Smoker  . Smokeless tobacco: Never Used  Substance Use Topics  . Alcohol use: No  . Drug use: No    ROS Constitutional: Denies fever, chills, weight loss/gain, headaches, insomnia,  night sweats, and change in appetite. Does c/o fatigue. Eyes: Denies redness, blurred vision, diplopia, discharge, itchy, watery eyes.  ENT: Denies discharge, congestion, post nasal drip, epistaxis, sore throat, earache, hearing loss, dental pain, Tinnitus, Vertigo, Sinus pain, snoring.  Cardio: Denies chest pain, palpitations, irregular heartbeat, syncope, dyspnea, diaphoresis, orthopnea, PND, claudication, edema Respiratory: denies cough, dyspnea, DOE, pleurisy, hoarseness, laryngitis, wheezing.  Gastrointestinal: Denies dysphagia, heartburn, reflux, water brash, pain, cramps, nausea, vomiting, bloating, diarrhea, constipation, hematemesis, melena, hematochezia, jaundice, hemorrhoids Genitourinary: Denies dysuria, frequency, urgency, nocturia, hesitancy, discharge, hematuria, flank pain Breast: Breast lumps, nipple discharge, bleeding.  Musculoskeletal: Denies arthralgia, myalgia, stiffness, Jt. Swelling, pain, limp, and strain/sprain. Denies falls. Skin: Denies puritis, rash, hives, warts, acne, eczema, changing in skin lesion Neuro: No weakness, tremor, incoordination, spasms, paresthesia, pain Psychiatric: Denies confusion, memory loss, sensory loss. Denies Depression. Endocrine: Denies change in weight, skin, hair change, nocturia, and paresthesia, diabetic polys, visual blurring, hyper / hypo glycemic episodes.  Heme/Lymph: No  excessive bleeding, bruising, enlarged lymph nodes.  Physical Exam  BP 124/80   Pulse 64   Temp (!) 97.4 F (36.3 C)   Resp 16   Ht 5' 1.5" (1.562 m)   Wt 126 lb 9.6 oz (57.4 kg)   BMI 23.53 kg/m   General Appearance: Well nourished, well groomed and in no apparent distress.  Eyes: PERRLA, EOMs, conjunctiva no swelling or erythema, normal fundi and vessels. Sinuses: No frontal/maxillary tenderness ENT/Mouth: EACs patent / TMs  nl. Nares clear without erythema, swelling, mucoid exudates. Oral hygiene is good. No erythema, swelling, or exudate. Tongue normal, non-obstructing. Tonsils not swollen or erythematous. Hearing normal.  Neck: Supple, thyroid not palpable. No bruits, nodes or JVD. Respiratory: Respiratory effort normal.  BS equal and clear bilateral without rales, rhonci, wheezing or stridor. Cardio: Heart sounds are normal with regular rate and rhythm and no murmurs, rubs or gallops. Peripheral pulses are normal and equal bilaterally without edema. No aortic or femoral bruits. Chest: symmetric with normal excursions and percussion. Breasts: Symmetric, without lumps, nipple discharge, retractions,  or fibrocystic changes.  Abdomen: Flat, soft with bowel sounds active. Nontender, no guarding, rebound, hernias, masses, or organomegaly.  Lymphatics: Non tender without lymphadenopathy.  Genitourinary:  Musculoskeletal: Full ROM all peripheral extremities, joint stability, 5/5 strength, and normal gait. Skin: Warm and dry without rashes, lesions, cyanosis, clubbing or  ecchymosis.  Neuro: Cranial nerves intact, reflexes equal bilaterally. Normal muscle tone, no cerebellar symptoms. Sensation intact.  Pysch: Alert and oriented X 3, normal affect, Insight and Judgment appropriate.   Assessment and Plan  1. Annual Preventative Screening Examination  2. Essential hypertension  - EKG 12-Lead - [place], RETROPERITNL ABD,  LTD - Urinalysis, Routine w reflex microscopic - Microalbumin /  creatinine urine ratio - CBC with Differential/Platelet - COMPLETE METABOLIC PANEL WITH GFR - Magnesium - TSH  3. Hyperlipidemia, mixed  - EKG 12-Lead - [place], RETROPERITNL ABD,  LTD - Lipid panel - TSH  4. Abnormal glucose  - EKG 12-Lead - [place], RETROPERITNL ABD,  LTD - Hemoglobin A1c - Insulin, random  5. Vitamin D deficiency  - VITAMIN D 25 Hydroxyl  6. Prediabetes  - EKG 12-Lead - [place], RETROPERITNL ABD,  LTD - Hemoglobin A1c - Insulin, random  7. Screening for colorectal cancer  - POC Hemoccult Bld/Stl  8. Screening for ischemic heart disease  - EKG 12-Lead  9. FH: heart disease  - EKG 12-Lead - [place], RETROPERITNL ABD,  LTD  10. Screening for AAA (aortic abdominal aneurysm)  - [place], RETROPERITNL ABD,  LTD  11. Screening-pulmonary TB  - TB Skin Test  12. Fatigue, unspecified type  - Iron,Total/Total Iron Binding Cap - Vitamin B12 - CBC with Differential/Platelet  13. Medication management  - Urinalysis, Routine w reflex microscopic - Microalbumin / creatinine urine ratio - CBC with Differential/Platelet - COMPLETE METABOLIC PANEL WITH GFR - Magnesium - Lipid panel - TSH - Hemoglobin A1c - Insulin, random - VITAMIN D 25 Hydroxyl        Patient was counseled in prudent diet to achieve/maintain BMI less than 25 for weight control, BP monitoring, regular exercise and medications. Discussed med's effects and SE's. Screening labs and tests as requested with regular follow-up as recommended. Over 40 minutes of exam, counseling, chart review and high complex critical decision making was performed.

## 2018-04-01 ENCOUNTER — Other Ambulatory Visit: Payer: Self-pay

## 2018-04-01 ENCOUNTER — Ambulatory Visit (INDEPENDENT_AMBULATORY_CARE_PROVIDER_SITE_OTHER): Payer: BLUE CROSS/BLUE SHIELD | Admitting: Internal Medicine

## 2018-04-01 ENCOUNTER — Encounter: Payer: Self-pay | Admitting: Internal Medicine

## 2018-04-01 VITALS — BP 124/80 | HR 64 | Temp 97.4°F | Resp 16 | Ht 61.5 in | Wt 126.6 lb

## 2018-04-01 DIAGNOSIS — R5383 Other fatigue: Secondary | ICD-10-CM

## 2018-04-01 DIAGNOSIS — Z0001 Encounter for general adult medical examination with abnormal findings: Secondary | ICD-10-CM

## 2018-04-01 DIAGNOSIS — H04123 Dry eye syndrome of bilateral lacrimal glands: Secondary | ICD-10-CM | POA: Diagnosis not present

## 2018-04-01 DIAGNOSIS — E559 Vitamin D deficiency, unspecified: Secondary | ICD-10-CM

## 2018-04-01 DIAGNOSIS — H25013 Cortical age-related cataract, bilateral: Secondary | ICD-10-CM | POA: Diagnosis not present

## 2018-04-01 DIAGNOSIS — Z13220 Encounter for screening for lipoid disorders: Secondary | ICD-10-CM | POA: Diagnosis not present

## 2018-04-01 DIAGNOSIS — R7303 Prediabetes: Secondary | ICD-10-CM

## 2018-04-01 DIAGNOSIS — Z0000 Encounter for general adult medical examination without abnormal findings: Secondary | ICD-10-CM

## 2018-04-01 DIAGNOSIS — H2513 Age-related nuclear cataract, bilateral: Secondary | ICD-10-CM | POA: Diagnosis not present

## 2018-04-01 DIAGNOSIS — R7309 Other abnormal glucose: Secondary | ICD-10-CM

## 2018-04-01 DIAGNOSIS — Z136 Encounter for screening for cardiovascular disorders: Secondary | ICD-10-CM

## 2018-04-01 DIAGNOSIS — Z79899 Other long term (current) drug therapy: Secondary | ICD-10-CM | POA: Diagnosis not present

## 2018-04-01 DIAGNOSIS — Z1212 Encounter for screening for malignant neoplasm of rectum: Secondary | ICD-10-CM

## 2018-04-01 DIAGNOSIS — Z8249 Family history of ischemic heart disease and other diseases of the circulatory system: Secondary | ICD-10-CM

## 2018-04-01 DIAGNOSIS — Z111 Encounter for screening for respiratory tuberculosis: Secondary | ICD-10-CM

## 2018-04-01 DIAGNOSIS — I10 Essential (primary) hypertension: Secondary | ICD-10-CM | POA: Diagnosis not present

## 2018-04-01 DIAGNOSIS — Z1211 Encounter for screening for malignant neoplasm of colon: Secondary | ICD-10-CM

## 2018-04-01 DIAGNOSIS — H35033 Hypertensive retinopathy, bilateral: Secondary | ICD-10-CM | POA: Diagnosis not present

## 2018-04-01 DIAGNOSIS — E782 Mixed hyperlipidemia: Secondary | ICD-10-CM

## 2018-04-01 LAB — HM DIABETES EYE EXAM

## 2018-04-02 LAB — LIPID PANEL
Cholesterol: 145 mg/dL (ref <200)
HDL: 41 mg/dL — ABNORMAL LOW (ref >=50)
LDL Cholesterol (Calc): 74 mg/dL (calc)
Non-HDL Cholesterol (Calc): 104 mg/dL (calc) (ref <130)
Total CHOL/HDL Ratio: 3.5 (calc) (ref <5.0)
Triglycerides: 199 mg/dL — ABNORMAL HIGH (ref <150)

## 2018-04-02 LAB — CBC WITH DIFFERENTIAL/PLATELET
Absolute Monocytes: 378 cells/uL (ref 200–950)
Basophils Absolute: 57 cells/uL (ref 0–200)
Basophils Relative: 0.9 %
Eosinophils Absolute: 271 cells/uL (ref 15–500)
Eosinophils Relative: 4.3 %
HCT: 39.5 % (ref 35.0–45.0)
Hemoglobin: 13.5 g/dL (ref 11.7–15.5)
Lymphs Abs: 2104 cells/uL (ref 850–3900)
MCH: 30.8 pg (ref 27.0–33.0)
MCHC: 34.2 g/dL (ref 32.0–36.0)
MCV: 90.2 fL (ref 80.0–100.0)
MPV: 10.1 fL (ref 7.5–12.5)
Monocytes Relative: 6 %
NEUTROS PCT: 55.4 %
Neutro Abs: 3490 cells/uL (ref 1500–7800)
PLATELETS: 263 10*3/uL (ref 140–400)
RBC: 4.38 10*6/uL (ref 3.80–5.10)
RDW: 11.8 % (ref 11.0–15.0)
Total Lymphocyte: 33.4 %
WBC: 6.3 10*3/uL (ref 3.8–10.8)

## 2018-04-02 LAB — HEMOGLOBIN A1C
Hgb A1c MFr Bld: 5.5 % of total Hgb (ref <5.7)
Mean Plasma Glucose: 111 (calc)
eAG (mmol/L): 6.2 (calc)

## 2018-04-02 LAB — TSH: TSH: 1.03 m[IU]/L (ref 0.40–4.50)

## 2018-04-02 LAB — URINALYSIS, ROUTINE W REFLEX MICROSCOPIC
BILIRUBIN URINE: NEGATIVE
GLUCOSE, UA: NEGATIVE
Hgb urine dipstick: NEGATIVE
Ketones, ur: NEGATIVE
Leukocytes,Ua: NEGATIVE
Nitrite: NEGATIVE
Protein, ur: NEGATIVE
SPECIFIC GRAVITY, URINE: 1.018 (ref 1.001–1.03)
pH: <=5 (ref 5.0–8.0)

## 2018-04-02 LAB — COMPLETE METABOLIC PANEL WITH GFR
AG Ratio: 1.9 (calc) (ref 1.0–2.5)
ALT: 13 U/L (ref 6–29)
AST: 16 U/L (ref 10–35)
Albumin: 4.5 g/dL (ref 3.6–5.1)
Alkaline phosphatase (APISO): 86 U/L (ref 37–153)
BUN: 16 mg/dL (ref 7–25)
CO2: 24 mmol/L (ref 20–32)
Calcium: 9.8 mg/dL (ref 8.6–10.4)
Chloride: 105 mmol/L (ref 98–110)
Creat: 0.72 mg/dL (ref 0.50–0.99)
GFR, Est African American: 104 mL/min/{1.73_m2} (ref >=60)
GFR, Est Non African American: 90 mL/min/{1.73_m2} (ref >=60)
GLOBULIN: 2.4 g/dL (ref 1.9–3.7)
Glucose, Bld: 86 mg/dL (ref 65–99)
Potassium: 4.2 mmol/L (ref 3.5–5.3)
SODIUM: 139 mmol/L (ref 135–146)
Total Bilirubin: 0.4 mg/dL (ref 0.2–1.2)
Total Protein: 6.9 g/dL (ref 6.1–8.1)

## 2018-04-03 ENCOUNTER — Encounter: Payer: Self-pay | Admitting: *Deleted

## 2018-06-13 ENCOUNTER — Other Ambulatory Visit: Payer: Self-pay | Admitting: Internal Medicine

## 2018-07-22 ENCOUNTER — Other Ambulatory Visit: Payer: Self-pay | Admitting: Internal Medicine

## 2018-08-19 ENCOUNTER — Ambulatory Visit (INDEPENDENT_AMBULATORY_CARE_PROVIDER_SITE_OTHER): Payer: BC Managed Care – PPO | Admitting: Adult Health

## 2018-08-19 ENCOUNTER — Other Ambulatory Visit: Payer: Self-pay

## 2018-08-19 ENCOUNTER — Encounter: Payer: Self-pay | Admitting: Adult Health

## 2018-08-19 VITALS — Wt 122.0 lb

## 2018-08-19 DIAGNOSIS — R197 Diarrhea, unspecified: Secondary | ICD-10-CM

## 2018-08-19 DIAGNOSIS — R109 Unspecified abdominal pain: Secondary | ICD-10-CM

## 2018-08-19 MED ORDER — HYOSCYAMINE SULFATE SL 0.125 MG SL SUBL: 0.1250 mg | SUBLINGUAL_TABLET | SUBLINGUAL | 0 refills | Status: DC | PRN

## 2018-08-19 NOTE — Progress Notes (Signed)
Virtual Visit via Telephone Note  I connected with Vicki Hansen on 08/19/18 at 10:30 AM EDT by telephone and verified that I am speaking with the correct person using two identifiers.  Location: Patient: home Provider: [place] home    I discussed the limitations, risks, security and privacy concerns of performing an evaluation and management service by telephone and the availability of in person appointments. I also discussed with the patient that there may be a patient responsible charge related to this service. The patient expressed understanding and agreed to proceed.   History of Present Illness:  There were no vitals taken for this visit.   63 y.o. female reports persistent diarrhea over the last 2 months; she reports onset was sudden, had runny stools, was having to go frequently, intermittently watery vs just very loose. She reports symptoms seem progressive, now awakening at night by urge and having to go multiple times. She endorses some nausea intermittently, generalized abdominal cramping. She endorses sense of bloating, does report increased belching.  She denies hx of GERD or reflux, denies epigastric burning, hoarseness, coughing. She denies fever/chills, fatigue, dizziness, unintentional weight loss, headaches, hot flashes/diaphoresis. Denies blood or mucus in stools, denies tarry stools.   No notable alleviating or aggravating factors, not significantly associated with meals/timing.   Has tried imodium but reports seems to cause cramping and abdominal bloating/swelling. She is taking pepto bismol which seems to help.   She denies any antibiotic use in the past 12 months.  She had normal colonoscopy on 05/09/2016 by Dr. [name]   She denies new medications or changes in supplements prior to onset of symptoms, no family members with similar, no unusual foods or travel prior to onset. She does currently take a daily magnesium supplement 250 mg daily as well as high dose flax seed oil.    Lab Results  Component Value Date   TSH 1.03 04/01/2018     Current Outpatient Medications on File Prior to Visit  Medication Sig Dispense Refill  . aspirin EC 81 MG tablet Take 81 mg by mouth daily.    Marland Kitchen CALCIUM PO Take 600 mg by mouth daily.    . cetirizine (ZYRTEC) 10 MG tablet Take 10 mg by mouth daily.    . Cholecalciferol (VITAMIN D PO) Take 5,000 Int'l Units by mouth daily.    . Cyanocobalamin (VITAMIN B-12) 5000 MCG SUBL Place 5,000 mcg under the tongue daily.    . enalapril (VASOTEC) 20 MG tablet Take 1 tablet by mouth once daily 90 tablet 0  . Flaxseed, Linseed, (FLAX SEED OIL PO) Take 4,800 mg by mouth daily.     Marland Kitchen MAGNESIUM PO Take 250 mg by mouth daily.    . Multiple Vitamin (MULTIVITAMIN) capsule Take 1 capsule by mouth daily.    . simvastatin (ZOCOR) 40 MG tablet TAKE ONE-HALF TO ONE TABLET AT BEDTIME FOR CHOLESTEROL 90 tablet 0  . BIOTIN PO Take 5,000 mg elemental calcium/kg/hr by mouth daily.     No current facility-administered medications on file prior to visit.      Allergies: No Active Allergies Medical History:  has Hypertension; Hyperlipidemia, mixed; Prediabetes; Vitamin D deficiency; and Medication management on their problem list. Surgical History:  She  has a past surgical history that includes Cesarean section; Knee arthroscopy (Left, 1972); Tonsillectomy and adenoidectomy; Stapedectomy (Left, 2003); right leg repair (1994); Colonoscopy (03/17/2005); and Robotic assisted total hysterectomy (2015). Family History:  Herfamily history includes Cancer in her father and mother; Colon cancer in her  maternal grandfather and maternal grandmother; Colon polyps in her mother; Diabetes in her father; Hypertension in her father and mother; Stomach cancer in her mother; Stroke in her father. Social History:   reports that she has never smoked. She has never used smokeless tobacco. She reports that she does not drink alcohol or use drugs.     Observations/Objective:  General : Well sounding patient in no apparent distress HEENT: no hoarseness, no cough for duration of visit Lungs: speaks in complete sentences, no audible wheezing, no apparent distress Neurological: alert, oriented x 3 Psychiatric: pleasant, judgement appropriate   Assessment and Plan:  Satrina was seen today for diarrhea.  Diagnoses and all orders for this visit:  Diarrhea, unspecified type/ Abdominal cramping Diarrhea/cramping/nausea of uncertain etiology Doubt c. Diff as she doesn't have healthcare or abx exposure in over 12 months Will initially have her stop magnesium and flax seed supplements and monitor for 2-3 days  Additionally suggested addition of align daily probiotic She will call back to report progress; if no improvement schedule lab visit for CBC, CMP/GFR, TSH, stool sample for culture/OVA/parasites, fecal calprotectin/lactoferrin, hemoccult; consider celiac study  Advised to call back with any change in symptoms; present to ED for bloody diarrhea, fainting, changes in mental status Night time symptoms are concerning; if not immediately improved by stopping supplement and initial workup will refer back to GI for evaluation  -     Hyoscyamine Sulfate SL (LEVSIN/SL) 0.125 MG SUBL; Place 0.125 mg under the tongue every 4 (four) hours as needed. For cramping and nausea.   Follow Up Instructions:    I discussed the assessment and treatment plan with the patient. The patient was provided an opportunity to ask questions and all were answered. The patient agreed with the plan and demonstrated an understanding of the instructions.   The patient was advised to call back or seek an in-person evaluation if the symptoms worsen or if the condition fails to improve as anticipated.  I provided 15 minutes of non-face-to-face time during this encounter.   Izora Ribas, NP

## 2018-08-22 ENCOUNTER — Telehealth: Payer: Self-pay

## 2018-08-22 ENCOUNTER — Other Ambulatory Visit: Payer: Self-pay | Admitting: Adult Health

## 2018-08-22 ENCOUNTER — Other Ambulatory Visit: Payer: BC Managed Care – PPO

## 2018-08-22 ENCOUNTER — Other Ambulatory Visit: Payer: Self-pay

## 2018-08-22 DIAGNOSIS — R109 Unspecified abdominal pain: Secondary | ICD-10-CM | POA: Diagnosis not present

## 2018-08-22 DIAGNOSIS — R197 Diarrhea, unspecified: Secondary | ICD-10-CM

## 2018-08-22 NOTE — Telephone Encounter (Signed)
Patient told to call back with an update on her Diarrhea. Patient has had bouts 12 times on Tuesday and 11 times on Wednesday.

## 2018-08-22 NOTE — Telephone Encounter (Signed)
Patient coming in for labs

## 2018-08-26 DIAGNOSIS — R197 Diarrhea, unspecified: Secondary | ICD-10-CM | POA: Diagnosis not present

## 2018-08-26 LAB — CBC WITH DIFFERENTIAL/PLATELET
Absolute Monocytes: 460 cells/uL (ref 200–950)
Basophils Absolute: 41 cells/uL (ref 0–200)
Basophils Relative: 0.7 %
Eosinophils Absolute: 59 cells/uL (ref 15–500)
Eosinophils Relative: 1 %
HCT: 40.8 % (ref 35.0–45.0)
Hemoglobin: 13.8 g/dL (ref 11.7–15.5)
Lymphs Abs: 1923 cells/uL (ref 850–3900)
MCH: 30.4 pg (ref 27.0–33.0)
MCHC: 33.8 g/dL (ref 32.0–36.0)
MCV: 89.9 fL (ref 80.0–100.0)
MPV: 10.5 fL (ref 7.5–12.5)
Monocytes Relative: 7.8 %
Neutro Abs: 3416 cells/uL (ref 1500–7800)
Neutrophils Relative %: 57.9 %
Platelets: 246 10*3/uL (ref 140–400)
RBC: 4.54 10*6/uL (ref 3.80–5.10)
RDW: 12.3 % (ref 11.0–15.0)
Total Lymphocyte: 32.6 %
WBC: 5.9 10*3/uL (ref 3.8–10.8)

## 2018-08-26 LAB — CELIAC DISEASE COMPREHENSIVE PANEL WITH REFLEXES
(tTG) Ab, IgA: 1 U/mL
Immunoglobulin A: 68 mg/dL — ABNORMAL LOW (ref 70–320)

## 2018-08-26 LAB — COMPLETE METABOLIC PANEL WITH GFR
AG Ratio: 2 (calc) (ref 1.0–2.5)
ALT: 25 U/L (ref 6–29)
AST: 26 U/L (ref 10–35)
Albumin: 4.7 g/dL (ref 3.6–5.1)
Alkaline phosphatase (APISO): 97 U/L (ref 37–153)
BUN: 14 mg/dL (ref 7–25)
CO2: 25 mmol/L (ref 20–32)
Calcium: 9.9 mg/dL (ref 8.6–10.4)
Chloride: 108 mmol/L (ref 98–110)
Creat: 0.73 mg/dL (ref 0.50–0.99)
GFR, Est African American: 102 mL/min/{1.73_m2} (ref >=60)
GFR, Est Non African American: 88 mL/min/{1.73_m2} (ref >=60)
Globulin: 2.3 g/dL (calc) (ref 1.9–3.7)
Glucose, Bld: 157 mg/dL — ABNORMAL HIGH (ref 65–99)
Potassium: 3.7 mmol/L (ref 3.5–5.3)
Sodium: 141 mmol/L (ref 135–146)
Total Bilirubin: 0.5 mg/dL (ref 0.2–1.2)
Total Protein: 7 g/dL (ref 6.1–8.1)

## 2018-08-26 LAB — TISSUE TRANSGLUTAMINASE, IGG: (tTG) Ab, IgG: 4 U/mL

## 2018-08-26 LAB — TSH: TSH: 1.04 mIU/L (ref 0.40–4.50)

## 2018-08-26 LAB — SEDIMENTATION RATE: Sed Rate: 2 mm/h (ref 0–30)

## 2018-08-28 LAB — GASTROINTESTINAL PATHOGEN PANEL PCR
C. difficile Tox A/B, PCR: NOT DETECTED
Campylobacter, PCR: NOT DETECTED
Cryptosporidium, PCR: NOT DETECTED
E coli (ETEC) LT/ST PCR: NOT DETECTED
E coli (STEC) stx1/stx2, PCR: NOT DETECTED
E coli 0157, PCR: NOT DETECTED
Giardia lamblia, PCR: NOT DETECTED
Norovirus, PCR: NOT DETECTED
Rotavirus A, PCR: NOT DETECTED
Salmonella, PCR: NOT DETECTED
Shigella, PCR: NOT DETECTED

## 2018-08-29 ENCOUNTER — Other Ambulatory Visit: Payer: Self-pay | Admitting: Adult Health

## 2018-08-29 DIAGNOSIS — R197 Diarrhea, unspecified: Secondary | ICD-10-CM

## 2018-08-30 LAB — CALPROTECTIN: Calprotectin: 35 mcg/g

## 2018-09-11 ENCOUNTER — Other Ambulatory Visit: Payer: Self-pay | Admitting: Internal Medicine

## 2018-10-02 ENCOUNTER — Ambulatory Visit: Payer: BLUE CROSS/BLUE SHIELD | Admitting: Adult Health

## 2018-10-07 ENCOUNTER — Encounter: Payer: Self-pay | Admitting: Gastroenterology

## 2018-10-07 ENCOUNTER — Ambulatory Visit: Payer: BC Managed Care – PPO | Admitting: Gastroenterology

## 2018-10-07 ENCOUNTER — Other Ambulatory Visit: Payer: Self-pay

## 2018-10-07 VITALS — BP 120/80 | HR 78 | Temp 97.1°F | Ht 61.0 in | Wt 125.0 lb

## 2018-10-07 DIAGNOSIS — R197 Diarrhea, unspecified: Secondary | ICD-10-CM

## 2018-10-07 DIAGNOSIS — R109 Unspecified abdominal pain: Secondary | ICD-10-CM | POA: Diagnosis not present

## 2018-10-07 MED ORDER — HYOSCYAMINE SULFATE ER 0.375 MG PO TB12: 0.3750 mg | ORAL_TABLET | Freq: Two times a day (BID) | ORAL | 3 refills | Status: DC

## 2018-10-07 NOTE — Progress Notes (Signed)
Review of pertinent gastrointestinal problems: 1.  Routine risk for colon cancer.  Colonoscopy April 2018 showed no polyps.  Internal hemorrhoids were noted.  She was recommended to have repeat colonoscopy at 10-year interval for colon cancer screening.  Colonoscopy 2008 was normal.   HPI: This is a very pleasant 63 year old woman who was referred to me by [name], NP  to evaluate chronic diarrhea.    She has had significant diarrhea, change in her bowels for the past 4 to 5 months.  Prior to the change she would have 3 or 4 soft bowel movements daily.  This was always her pattern.  Starting for 5 months ago she has been having 6-15 loose stools daily.  She has been woken up by this at times.  She has not seen any bleeding.  She does have some cramping and some nausea at times as well.  Overall her weight has been stable.  She cannot point to any foods that contribute to this.  It even happens with water.  She has not had any changes to her medicines prior to this.  She was not on antibiotics for the several months prior to this either.  2 grandparents on her mother's side had colon cancer and an uncle had colon cancer.  Her mother had stomach cancer.  She had a battery of blood and stool tests, all essentially negative, see below.  Old Data Reviewed:  Lab testing August 2020 GI pathogen panel was negative, fecal calprotectin was negative, sed rate and celiac sprue testing serologies were negative, white blood cell count was completely normal, complete metabolic profile was completely normal   Review of systems: Pertinent positive and negative review of systems were noted in the above HPI section. All other review negative.   Past Medical History:  Diagnosis Date  . Allergy   . Arthritis    BACK   . Cancer (HCC)    UTERINE   . Cataract    MILD  . GERD (gastroesophageal reflux disease)   . Hyperlipidemia   . Hypertension   . Iritis    HLA B27 +  . Prediabetes   . S/P  ORIF (open reduction internal fixation) fracture 1994   Right tib/fib  . Thyroiditis, subacute   . Vitamin D deficiency     Past Surgical History:  Procedure Laterality Date  . CESAREAN SECTION    . COLONOSCOPY  03/17/2005   HEMS   . KNEE ARTHROSCOPY Left 1972  . right leg repair  1994   rod   . ROBOTIC ASSISTED TOTAL HYSTERECTOMY  2015   pt. states she had robot assisted hysterectomy and uterus was removed abdominally due to scar tissue.  she also states that both tubes and ovaries were removed.  . STAPEDECTOMY Left 2003  . TONSILLECTOMY AND ADENOIDECTOMY      Current Outpatient Medications  Medication Sig Dispense Refill  . aspirin EC 81 MG tablet Take 81 mg by mouth daily.    Marland Kitchen CALCIUM PO Take 600 mg by mouth daily.    . cetirizine (ZYRTEC) 10 MG tablet Take 10 mg by mouth daily.    . Cholecalciferol (VITAMIN D PO) Take 5,000 Int'l Units by mouth daily.    . Cyanocobalamin (VITAMIN B-12) 5000 MCG SUBL Place 5,000 mcg under the tongue daily.    . enalapril (VASOTEC) 20 MG tablet Take 1 tablet by mouth once daily 90 tablet 0  . Flaxseed, Linseed, (FLAX SEED OIL PO) Take 4,800 mg by mouth daily.     Marland Kitchen  Hyoscyamine Sulfate SL (LEVSIN/SL) 0.125 MG SUBL Place 0.125 mg under the tongue every 4 (four) hours as needed. For cramping and nausea. 90 tablet 0  . Multiple Vitamin (MULTIVITAMIN) capsule Take 1 capsule by mouth daily.    . simvastatin (ZOCOR) 40 MG tablet TAKE ONE-HALF TO ONE TABLET AT BEDTIME FOR CHOLESTEROL 90 tablet 0   No current facility-administered medications for this visit.     Allergies as of 10/07/2018  . (No Known Allergies)    Family History  Problem Relation Age of Onset  . Cancer Mother        uterine, kidney  . Hypertension Mother   . Colon polyps Mother   . Stomach cancer Mother   . Diabetes Father   . Hypertension Father   . Stroke Father   . Cancer Father        kidney  . Colon cancer Maternal Grandmother   . Colon cancer Maternal Grandfather    . Esophageal cancer Neg Hx   . Rectal cancer Neg Hx     Social History   Socioeconomic History  . Marital status: Married    Spouse name: Not on file  . Number of children: Not on file  . Years of education: Not on file  . Highest education level: Not on file  Occupational History  . Not on file  Social Needs  . Financial resource strain: Not on file  . Food insecurity    Worry: Not on file    Inability: Not on file  . Transportation needs    Medical: Not on file    Non-medical: Not on file  Tobacco Use  . Smoking status: Never Smoker  . Smokeless tobacco: Never Used  Substance and Sexual Activity  . Alcohol use: No  . Drug use: No  . Sexual activity: Not on file  Lifestyle  . Physical activity    Days per week: Not on file    Minutes per session: Not on file  . Stress: Not on file  Relationships  . Social Herbalist on phone: Not on file    Gets together: Not on file    Attends religious service: Not on file    Active member of club or organization: Not on file    Attends meetings of clubs or organizations: Not on file    Relationship status: Not on file  . Intimate partner violence    Fear of current or ex partner: Not on file    Emotionally abused: Not on file    Physically abused: Not on file    Forced sexual activity: Not on file  Other Topics Concern  . Not on file  Social History Narrative  . Not on file     Physical Exam: BP 120/80   Pulse 78   Temp (!) 97.1 F (36.2 C)   Ht _0  (1.549 m)   Wt 125 lb (56.7 kg)   BMI 23.62 kg/m  Constitutional: generally well-appearing Psychiatric: alert and oriented x3 Eyes: extraocular movements intact Mouth: oral pharynx moist, no lesions Neck: supple no lymphadenopathy Cardiovascular: heart regular rate and rhythm Lungs: clear to auscultation bilaterally Abdomen: soft, nontender, nondistended, no obvious ascites, no peritoneal signs, normal bowel sounds Extremities: no lower extremity  edema bilaterally Skin: no lesions on visible extremities   Assessment and plan: 63 y.o. female with chronic nonbloody diarrhea  This has been going on for for 5 months.  She has had a very thorough work-up through  her primary care physician, see those results summarized above.  Possibly she has microscopic colitis.  That would take either flexible sigmoidoscopy or colonoscopy to prove.  Perhaps she has a pancreatic lesion this is stearrhea.  I recommended we start her work-up with a CT scan abdomen pelvis if that is unrevealing that she will need a repeat colonoscopy.  In the meantime she is going to start antispasmodic Levbid 0.375 mg 1 pill twice daily for her cramping and this may thicken her stools a bit as well.   Please see the "Patient Instructions" section for addition details about the plan.   Owens Loffler, MD [facility] 10/07/2018, 2:00 PM  Cc: [name], NP

## 2018-10-07 NOTE — Patient Instructions (Signed)
You have been scheduled for a CT scan of the abdomen and pelvis at [facility] ([address]---this is in the same building as Press [facility]).   You are scheduled on 10/23/18 at 1030am. You should arrive 15 minutes prior to your appointment time for registration. Please follow the written instructions below on the day of your exam:  WARNING: IF YOU ARE ALLERGIC TO IODINE/X-RAY DYE, PLEASE NOTIFY RADIOLOGY IMMEDIATELY AT [phone]! YOU WILL BE GIVEN A 13 HOUR PREMEDICATION PREP.  1) Do not eat or drink anything after 630am (4 hours prior to your test) 2) You have been given 2 bottles of oral contrast to drink. The solution may taste better if refrigerated, but do NOT add ice or any other liquid to this solution. Shake well before drinking.    Drink 1 bottle of contrast @ 830am (2 hours prior to your exam)  Drink 1 bottle of contrast @ 930am (1 hour prior to your exam)  You may take any medications as prescribed with a small amount of water, if necessary. If you take any of the following medications: METFORMIN, GLUCOPHAGE, GLUCOVANCE, AVANDAMET, RIOMET, FORTAMET, [place] MET, JANUMET, GLUMETZA or METAGLIP, you MAY be asked to HOLD this medication 48 hours AFTER the exam.  The purpose of you drinking the oral contrast is to aid in the visualization of your intestinal tract. The contrast solution may cause some diarrhea. Depending on your individual set of symptoms, you may also receive an intravenous injection of x-ray contrast/dye. Plan on being at [facility] for 30 minutes or longer, depending on the type of exam you are having performed.  This test typically takes 30-45 minutes to complete.  If you have any questions regarding your exam or if you need to reschedule, you may call the CT department at [phone] between the hours of 8:00 am and 5:00 pm, Monday-Friday.  We have sent the following medications to your pharmacy for you to pick up at your  convenience:  Thank you for entrusting me with your care and choosing [facility].  Dr [name]   ________________________________________________________________________

## 2018-10-18 ENCOUNTER — Other Ambulatory Visit: Payer: Self-pay | Admitting: Gastroenterology

## 2018-10-18 DIAGNOSIS — R109 Unspecified abdominal pain: Secondary | ICD-10-CM

## 2018-10-21 ENCOUNTER — Other Ambulatory Visit: Payer: BC Managed Care – PPO

## 2018-10-21 DIAGNOSIS — R109 Unspecified abdominal pain: Secondary | ICD-10-CM

## 2018-10-22 LAB — BUN/CREATININE RATIO
BUN: 16 mg/dL (ref 7–25)
Creat: 0.74 mg/dL (ref 0.50–0.99)
GFR, Est African American: 100 mL/min/{1.73_m2} (ref >=60)
GFR, Est Non African American: 86 mL/min/{1.73_m2} (ref >=60)

## 2018-10-23 ENCOUNTER — Ambulatory Visit (INDEPENDENT_AMBULATORY_CARE_PROVIDER_SITE_OTHER)
Admission: RE | Admit: 2018-10-23 | Discharge: 2018-10-23 | Disposition: A | Discharge status: Home / Self Care | Payer: BC Managed Care – PPO | Source: Ambulatory Visit | Attending: Gastroenterology | Admitting: Gastroenterology

## 2018-10-23 ENCOUNTER — Other Ambulatory Visit: Payer: Self-pay

## 2018-10-23 DIAGNOSIS — R197 Diarrhea, unspecified: Secondary | ICD-10-CM

## 2018-10-23 DIAGNOSIS — R109 Unspecified abdominal pain: Secondary | ICD-10-CM | POA: Diagnosis not present

## 2018-10-23 MED ORDER — IOHEXOL 300 MG/ML  SOLN
100.0000 mL | Freq: Once | INTRAMUSCULAR | Status: AC | PRN
  Administered 2018-10-23: 11:00:00 100 mL via INTRAVENOUS

## 2018-11-12 ENCOUNTER — Other Ambulatory Visit: Payer: Self-pay

## 2018-11-12 ENCOUNTER — Ambulatory Visit (AMBULATORY_SURGERY_CENTER): Payer: Self-pay | Admitting: *Deleted

## 2018-11-12 VITALS — Temp 97.8°F | Ht 61.0 in | Wt 127.2 lb

## 2018-11-12 DIAGNOSIS — R197 Diarrhea, unspecified: Secondary | ICD-10-CM

## 2018-11-12 DIAGNOSIS — R109 Unspecified abdominal pain: Secondary | ICD-10-CM

## 2018-11-12 NOTE — Progress Notes (Signed)
No egg or soy allergy known to patient  No issues with past sedation with any surgeries  or procedures, no intubation problems  No diet pills per patient No home 02 use per patient  No blood thinners per patient  Pt denies issues with constipation  No A fib or A flutter  EMMI video sent to pt's e mail   Due to the COVID-19 pandemic we are asking patients to follow these guidelines. Please only bring one care partner. Please be aware that your care partner may wait in the car in the parking lot or if they feel like they will be too hot to wait in the car, they may wait in the lobby on the 4th floor. All care partners are required to wear a mask the entire time (we do not have any that we can provide them), they need to practice social distancing, and we will do a Covid check for all patient's and care partners when you arrive. Also we will check their temperature and your temperature. If the care partner waits in their car they need to stay in the parking lot the entire time and we will call them on their cell phone when the patient is ready for discharge so they can bring the car to the front of the building. Also all patient's will need to wear a mask into building.

## 2018-11-13 ENCOUNTER — Ambulatory Visit (INDEPENDENT_AMBULATORY_CARE_PROVIDER_SITE_OTHER): Payer: BC Managed Care – PPO

## 2018-11-13 ENCOUNTER — Other Ambulatory Visit: Payer: Self-pay

## 2018-11-13 VITALS — Temp 97.6°F

## 2018-11-13 DIAGNOSIS — Z23 Encounter for immunization: Secondary | ICD-10-CM | POA: Diagnosis not present

## 2018-11-15 ENCOUNTER — Encounter: Payer: Self-pay | Admitting: Gastroenterology

## 2018-11-26 ENCOUNTER — Ambulatory Visit (INDEPENDENT_AMBULATORY_CARE_PROVIDER_SITE_OTHER): Payer: BC Managed Care – PPO

## 2018-11-26 ENCOUNTER — Other Ambulatory Visit: Payer: Self-pay | Admitting: Gastroenterology

## 2018-11-26 DIAGNOSIS — Z1159 Encounter for screening for other viral diseases: Secondary | ICD-10-CM | POA: Diagnosis not present

## 2018-11-27 LAB — SARS CORONAVIRUS 2 (TAT 6-24 HRS): SARS Coronavirus 2: NEGATIVE

## 2018-11-29 ENCOUNTER — Ambulatory Visit (AMBULATORY_SURGERY_CENTER): Payer: BC Managed Care – PPO | Admitting: Gastroenterology

## 2018-11-29 ENCOUNTER — Other Ambulatory Visit: Payer: Self-pay

## 2018-11-29 ENCOUNTER — Encounter: Payer: Self-pay | Admitting: Gastroenterology

## 2018-11-29 VITALS — BP 127/73 | HR 64 | Temp 98.9°F | Resp 11 | Ht 61.0 in | Wt 127.0 lb

## 2018-11-29 DIAGNOSIS — Z1211 Encounter for screening for malignant neoplasm of colon: Secondary | ICD-10-CM | POA: Diagnosis not present

## 2018-11-29 DIAGNOSIS — R109 Unspecified abdominal pain: Secondary | ICD-10-CM

## 2018-11-29 DIAGNOSIS — R197 Diarrhea, unspecified: Secondary | ICD-10-CM

## 2018-11-29 DIAGNOSIS — K52832 Lymphocytic colitis: Secondary | ICD-10-CM | POA: Diagnosis not present

## 2018-11-29 MED ORDER — SODIUM CHLORIDE 0.9 % IV SOLN: 500.0000 mL | INTRAVENOUS | Status: DC

## 2018-11-29 NOTE — Progress Notes (Signed)
A and O x3. Report to RN. Tolerated MAC anesthesia well.

## 2018-11-29 NOTE — Op Note (Signed)
[facility] Patient Name: Vicki Hansen Procedure Date: 11/29/2018 11:00 AM MRN: [id number] Endoscopist: Milus Banister , MD Age: 63 Referring MD:  Date of Birth: November 10, 1955 Gender: Female Account #: [id number] Procedure:                Colonoscopy Indications:              Chronic diarrhea Medicines:                Monitored Anesthesia Care Procedure:                Pre-Anesthesia Assessment:                           - Prior to the procedure, a History and Physical                            was performed, and patient medications and                            allergies were reviewed. The patient's tolerance of                            previous anesthesia was also reviewed. The risks                            and benefits of the procedure and the sedation                            options and risks were discussed with the patient.                            All questions were answered, and informed consent                            was obtained. Prior Anticoagulants: The patient has                            taken no previous anticoagulant or antiplatelet                            agents. ASA Grade Assessment: II - A patient with                            mild systemic disease. After reviewing the risks                            and benefits, the patient was deemed in                            satisfactory condition to undergo the procedure.                           After obtaining informed consent, the colonoscope  was passed under direct vision. Throughout the                            procedure, the patient's blood pressure, pulse, and                            oxygen saturations were monitored continuously. The                            Colonoscope was introduced through the anus and                            advanced to the the terminal ileum. The colonoscopy                            was performed without difficulty. The  patient                            tolerated the procedure well. The quality of the                            bowel preparation was good. The terminal ileum,                            ileocecal valve, appendiceal orifice, and rectum                            were photographed. Scope In: 11:11:34 AM Scope Out: 11:23:05 AM Scope Withdrawal Time: 0 hours 8 minutes 18 seconds  Total Procedure Duration: 0 hours 11 minutes 31 seconds  Findings:                 The terminal ileum appeared normal.                           Diffuse mild inflammation characterized by erythema                            and granularity was found in the entire colon (mild                            UC?). Biopsies were taken with a cold forceps for                            histology.                           Medium sized internal hemorrhoids.                           The exam was otherwise without abnormality on                            direct and retroflexion views. Complications:            No immediate complications. Estimated  blood loss:                            None. Estimated Blood Loss:     Estimated blood loss: none. Impression:               - The examined portion of the ileum was normal.                           - Diffuse mild inflammation was found in the entire                            examined colon. Biopsied. This is suspicious for                            mild UC.                           - Medium sized internal hemorrhoids.                           - The examination was otherwise normal on direct                            and retroflexion views. Recommendation:           - Patient has a contact number available for                            emergencies. The signs and symptoms of potential                            delayed complications were discussed with the                            patient. Return to normal activities tomorrow.                            Written discharge  instructions were provided to the                            patient.                           - Resume previous diet.                           - Continue present medications.                           - Await pathology results. Milus Banister, MD 11/29/2018 11:27:34 AM This report has been signed electronically.

## 2018-11-29 NOTE — Patient Instructions (Signed)
Await pathology results.  YOU HAD AN ENDOSCOPIC PROCEDURE TODAY AT [facility]:   Refer to the procedure report that was given to you for any specific questions about what was found during the examination.  If the procedure report does not answer your questions, please call your gastroenterologist to clarify.  If you requested that your care partner not be given the details of your procedure findings, then the procedure report has been included in a sealed envelope for you to review at your convenience later.  YOU SHOULD EXPECT: Some feelings of bloating in the abdomen. Passage of more gas than usual.  Walking can help get rid of the air that was put into your GI tract during the procedure and reduce the bloating. If you had a lower endoscopy (such as a colonoscopy or flexible sigmoidoscopy) you may notice spotting of blood in your stool or on the toilet paper. If you underwent a bowel prep for your procedure, you may not have a normal bowel movement for a few days.  Please Note:  You might notice some irritation and congestion in your nose or some drainage.  This is from the oxygen used during your procedure.  There is no need for concern and it should clear up in a day or so.  SYMPTOMS TO REPORT IMMEDIATELY:   Following lower endoscopy (colonoscopy or flexible sigmoidoscopy):  Excessive amounts of blood in the stool  Significant tenderness or worsening of abdominal pains  Swelling of the abdomen that is new, acute  Fever of 100F or higher    For urgent or emergent issues, a gastroenterologist can be reached at any hour by calling [phone].   DIET:  We do recommend a small meal at first, but then you may proceed to your regular diet.  Drink plenty of fluids but you should avoid alcoholic beverages for 24 hours.  ACTIVITY:  You should plan to take it easy for the rest of today and you should NOT DRIVE or use heavy machinery until tomorrow (because of the sedation  medicines used during the test).    FOLLOW UP: Our staff will call the number listed on your records 48-72 hours following your procedure to check on you and address any questions or concerns that you may have regarding the information given to you following your procedure. If we do not reach you, we will leave a message.  We will attempt to reach you two times.  During this call, we will ask if you have developed any symptoms of COVID 19. If you develop any symptoms (ie: fever, flu-like symptoms, shortness of breath, cough etc.) before then, please call [phone].  If you test positive for Covid 19 in the 2 weeks post procedure, please call and report this information to [place].    If any biopsies were taken you will be contacted by phone or by letter within the next 1-3 weeks.  Please call us at [phone] if you have not heard about the biopsies in 3 weeks.    SIGNATURES/CONFIDENTIALITY: You and/or your care partner have signed paperwork which will be entered into your electronic medical record.  These signatures attest to the fact that that the information above on your After Visit Summary has been reviewed and is understood.  Full responsibility of the confidentiality of this discharge information lies with you and/or your care-partner.

## 2018-11-29 NOTE — Progress Notes (Signed)
Called to room to assist during endoscopic procedure.  Patient ID and intended procedure confirmed with present staff. Received instructions for my participation in the procedure from the performing physician.  

## 2018-11-29 NOTE — Progress Notes (Signed)
Temp LC V/s CW I have reviewed the patient's medical history in detail and updated the computerized patient record. 

## 2018-12-03 ENCOUNTER — Telehealth: Payer: Self-pay | Admitting: *Deleted

## 2018-12-03 NOTE — Telephone Encounter (Signed)
  Follow up Call-  Call back number 11/29/2018 05/09/2016  Post procedure Call Back phone  # [phone]  Permission to leave phone message Yes Yes  Some recent data might be hidden     Patient questions:  Do you have a fever, pain , or abdominal swelling? No. Pain Score  0 *  Have you tolerated food without any problems? Yes.    Have you been able to return to your normal activities? Yes.    Do you have any questions about your discharge instructions: Diet   No. Medications  No. Follow up visit  No.  Do you have questions or concerns about your Care? Yes.    Actions: * If pain score is 4 or above: No action needed, pain <4.  1. Have you developed a fever since your procedure? no  2.   Have you had an respiratory symptoms (SOB or cough) since your procedure? no  3.   Have you tested positive for COVID 19 since your procedure no  4.   Have you had any family members/close contacts diagnosed with the COVID 19 since your procedure?  no   If yes to any of these questions please route to Joylene John, RN and Alphonsa Gin, Therapist, sports.

## 2018-12-03 NOTE — Telephone Encounter (Signed)
Message left

## 2018-12-04 ENCOUNTER — Other Ambulatory Visit: Payer: Self-pay

## 2018-12-04 MED ORDER — BUDESONIDE 3 MG PO CPEP: 3.0000 mg | ORAL_CAPSULE | Freq: Every day | ORAL | 5 refills | Status: DC

## 2018-12-05 ENCOUNTER — Telehealth: Payer: Self-pay | Admitting: Gastroenterology

## 2018-12-05 NOTE — Telephone Encounter (Signed)
Please advise. Budesonide is $300 and she can not afford it. Thank you.

## 2018-12-05 NOTE — Telephone Encounter (Signed)
Hostetter, Beverlee Nims [phone]  Valma Cava 21 minutes ago (9:17 AM)   Dr. [name] put in request for medication yesterday (budesonide) and she said her co-pay for it is $300 and she can not afford that- asking if there is something else she can take.   Incoming call

## 2018-12-06 NOTE — Telephone Encounter (Signed)
Informed patient and to use the over the encounter Imodium. Patient agreed with plan.

## 2018-12-06 NOTE — Telephone Encounter (Signed)
Please call her.  Certainly if over-the-counter Imodium is effective that would be a fine solution as well.  Recommend that she start taking 2 over-the-counter Imodium every morning shortly after waking up and repeat later on the day if needed.  Office visit with me in 6 weeks or so to discuss her response.  Thank you

## 2018-12-09 ENCOUNTER — Other Ambulatory Visit: Payer: Self-pay | Admitting: Adult Health

## 2019-01-21 ENCOUNTER — Ambulatory Visit (INDEPENDENT_AMBULATORY_CARE_PROVIDER_SITE_OTHER): Payer: 59 | Admitting: Gastroenterology

## 2019-01-21 ENCOUNTER — Encounter: Payer: Self-pay | Admitting: Gastroenterology

## 2019-01-21 VITALS — BP 136/80 | HR 84 | Temp 98.5°F | Ht 60.5 in | Wt 130.0 lb

## 2019-01-21 DIAGNOSIS — K51019 Ulcerative (chronic) pancolitis with unspecified complications: Secondary | ICD-10-CM | POA: Diagnosis not present

## 2019-01-21 MED ORDER — MESALAMINE 1.2 G PO TBEC: 4.8000 g | DELAYED_RELEASE_TABLET | Freq: Every day | ORAL | 11 refills | Status: DC

## 2019-01-21 NOTE — Progress Notes (Signed)
Review of pertinent gastrointestinal problems: 1.  Routine risk for colon cancer.  Colonoscopy April 2018 showed no polyps.  Internal hemorrhoids were noted.  She was recommended to have repeat colonoscopy at 10-year interval for colon cancer screening.  Colonoscopy 2008 was normal. 2.  Chronic diarrhea led to colonoscopy November 2020.  Terminal ileum was normal.  There was mild erythema and granularity throughout the colon.  Biopsies were taken.  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   HPI: This is a very pleasant 64 year old woman whom I last saw at the time of a colonoscopy about 2 months ago.  Budesonide was too expensive and so instead she tried twice daily Imodium on a scheduled basis.  This caused crampiness and nausea.  She cut back to once a day on a as needed basis.  When she does take a single Imodium pill she tends to be constipated for about 24 to 48 hours.  If she does not take any medicines she will have 3-4 loose stools daily on a good day.  Up to 7 times per day on a bad day.  Never bloody.  Her weight is up over the past 3 to 4 months.   ROS: complete GI ROS as described in HPI, all other review negative.  Constitutional:  No unintentional weight loss   Past Medical History:  Diagnosis Date  . Allergy   . Arthritis    BACK   . Cancer (HCC)    UTERINE   . Cataract    MILD  . GERD (gastroesophageal reflux disease)   . Hyperlipidemia   . Hypertension   . Iritis    HLA B27 +  . Prediabetes   . S/P ORIF (open reduction internal fixation) fracture 1994   Right tib/fib  . Thyroiditis, subacute   . Vitamin D deficiency     Past Surgical History:  Procedure Laterality Date  . CESAREAN SECTION    . COLONOSCOPY  03/17/2005   HEMS   . KNEE ARTHROSCOPY Left 1972  . right leg repair  1994   rod   . ROBOTIC ASSISTED TOTAL HYSTERECTOMY  2015   pt. states she had robot assisted  hysterectomy and uterus was removed abdominally due to scar tissue.  she also states that both tubes and ovaries were removed.  . STAPEDECTOMY Left 2003  . TONSILLECTOMY AND ADENOIDECTOMY      Current Outpatient Medications  Medication Sig Dispense Refill  . aspirin EC 81 MG tablet Take 81 mg by mouth daily.    Marland Kitchen CALCIUM PO Take 600 mg by mouth daily.    . cetirizine (ZYRTEC) 10 MG tablet Take 10 mg by mouth daily.    . Cholecalciferol (VITAMIN D PO) Take 5,000 Int'l Units by mouth daily.    . Cyanocobalamin (VITAMIN B-12) 5000 MCG SUBL Place 5,000 mcg under the tongue daily.    . enalapril (VASOTEC) 20 MG tablet Take 1 tablet Daily for BP 90 tablet 3  . Flaxseed Oil (LINSEED OIL) OIL Take 1,200 mg by mouth 2 (two) times daily.     . hyoscyamine (LEVBID) 0.375 MG 12 hr tablet Take 1 tablet (0.375 mg total) by mouth 2 (two) times daily. 60 tablet 3  . Hyoscyamine Sulfate SL (LEVSIN/SL) 0.125 MG SUBL Place 0.125 mg under the tongue every 4 (four) hours as needed. For cramping and nausea. 90 tablet 0  . Multiple Vitamin (MULTIVITAMIN) capsule Take 1 capsule by mouth daily.    Marland Kitchen  simvastatin (ZOCOR) 40 MG tablet TAKE ONE-HALF TO ONE TABLET AT BEDTIME FOR CHOLESTEROL 90 tablet 0   No current facility-administered medications for this visit.    Allergies as of 01/21/2019  . (No Known Allergies)    Family History  Problem Relation Age of Onset  . Cancer Mother        uterine, kidney  . Hypertension Mother   . Colon polyps Mother   . Stomach cancer Mother   . Diabetes Father   . Hypertension Father   . Stroke Father   . Cancer Father        kidney  . Colon cancer Maternal Grandmother   . Colon cancer Maternal Grandfather   . Esophageal cancer Neg Hx   . Rectal cancer Neg Hx     Social History   Socioeconomic History  . Marital status: Married    Spouse name: Not on file  . Number of children: Not on file  . Years of education: Not on file  . Highest education level: Not on  file  Occupational History  . Not on file  Tobacco Use  . Smoking status: Never Smoker  . Smokeless tobacco: Never Used  Substance and Sexual Activity  . Alcohol use: No  . Drug use: No  . Sexual activity: Not on file  Other Topics Concern  . Not on file  Social History Narrative  . Not on file   Social Determinants of Health   Financial Resource Strain:   . Difficulty of Paying Living Expenses: Not on file  Food Insecurity:   . Worried About Charity fundraiser in the Last Year: Not on file  . Ran Out of Food in the Last Year: Not on file  Transportation Needs:   . Lack of Transportation (Medical): Not on file  . Lack of Transportation (Non-Medical): Not on file  Physical Activity:   . Days of Exercise per Week: Not on file  . Minutes of Exercise per Session: Not on file  Stress:   . Feeling of Stress : Not on file  Social Connections:   . Frequency of Communication with Friends and Family: Not on file  . Frequency of Social Gatherings with Friends and Family: Not on file  . Attends Religious Services: Not on file  . Active Member of Clubs or Organizations: Not on file  . Attends Archivist Meetings: Not on file  . Marital Status: Not on file  Intimate Partner Violence:   . Fear of Current or Ex-Partner: Not on file  . Emotionally Abused: Not on file  . Physically Abused: Not on file  . Sexually Abused: Not on file     Physical Exam: BP 136/80 (BP Location: Left Arm, Patient Position: Sitting, Cuff Size: Normal)   Pulse 84   Temp 98.5 F (36.9 C)   Ht 5' 0.5" (1.537 m) Comment: height measured without shoes  Wt 130 lb (59 kg)   BMI 24.97 kg/m  Constitutional: generally well-appearing Psychiatric: alert and oriented x3 Abdomen: soft, nontender, nondistended, no obvious ascites, no peritoneal signs, normal bowel sounds No peripheral edema noted in lower extremities  Assessment and plan: 64 y.o. 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  She did have some very mild erythema and granularity to her colon I think she has mild UC.  She responds very well to even a single dose of Imodium  but she does not like taking it because it caused a lot of cramping and nausea.  I am going to try her on oral mesalamine, Lialda 4 pills once daily.  We will call her in a new prescription she will return to see me in 6 weeks.  Note that her baseline bowel habits are 2-3 soft formed stools on a daily basis for many many years prior to the change.  It will be our goal to get her to that level again.  Please see the "Patient Instructions" section for addition details about the plan.  Owens Loffler, MD [facility] 01/21/2019, 9:21 AM

## 2019-01-21 NOTE — Patient Instructions (Addendum)
If you are age 64 or older, your body mass index should be between 23-30. Your Body mass index is 24.97 kg/m. If this is out of the aforementioned range listed, please consider follow up with your Primary Care Provider.  If you are age 14 or younger, your body mass index should be between 19-25. Your Body mass index is 24.97 kg/m. If this is out of the aformentioned range listed, please consider follow up with your Primary Care Provider.   We have sent the following medications to your pharmacy for you to pick up at your convenience: Lialda

## 2019-01-27 ENCOUNTER — Telehealth: Payer: Self-pay | Admitting: Gastroenterology

## 2019-01-27 NOTE — Telephone Encounter (Signed)
Please advise 

## 2019-01-28 ENCOUNTER — Other Ambulatory Visit: Payer: Self-pay | Admitting: Internal Medicine

## 2019-01-28 MED ORDER — MESALAMINE ER 0.375 G PO CP24: 1500.0000 mg | ORAL_CAPSULE | Freq: Every day | ORAL | 11 refills | Status: DC

## 2019-01-28 NOTE — Telephone Encounter (Signed)
Okay let us see if apriso is more favored by her insurance company.  0.375 g pills.  She should take 4 pills once daily.  Dispense 120 with 11 refills.

## 2019-01-28 NOTE — Telephone Encounter (Signed)
Patient informed of change in medication.

## 2019-01-29 ENCOUNTER — Telehealth: Payer: Self-pay

## 2019-01-29 NOTE — Telephone Encounter (Signed)
[facility]  On [facility] [address] [facility] Dr. Virgel Gess [place] a  notification that mesalamine is $250 after insurance and asked if there is an alternative med the patient can take. Called and informed patient.  She will call her insurance company and ask which mesalamine they may cover better. She understands to call us back and we will see if Dr. [name] will send a new script for the alternative.

## 2019-01-30 ENCOUNTER — Telehealth: Payer: Self-pay | Admitting: Gastroenterology

## 2019-01-30 DIAGNOSIS — K52832 Lymphocytic colitis: Secondary | ICD-10-CM

## 2019-01-30 NOTE — Telephone Encounter (Signed)
Please advise. The Mesalamine cost is $250 and it is to expensive for her.

## 2019-01-31 MED ORDER — MESALAMINE 400 MG PO CPDR: 800.0000 mg | DELAYED_RELEASE_CAPSULE | Freq: Three times a day (TID) | ORAL | 5 refills | Status: DC

## 2019-01-31 NOTE — Telephone Encounter (Signed)
Script for Mesalamine DR into pharmacy. Received prior authorization request as medication is not covered by insurance. Please advise.

## 2019-01-31 NOTE — Telephone Encounter (Signed)
Sulfasalazine is not the same as mesalamine.  We might have to use this however that has a worse side effect profile in sores still like to try to get her one of the formulations of mesalamine instead.  Tell her I am sorry that her insurance company is being so difficult about this   Lets try generic mesalamine DR tabs.  800 mg tab, 2 tabs 3 times daily.  Dispense 1 month with 11 refills.

## 2019-02-03 MED ORDER — SULFASALAZINE 500 MG PO TABS: 1000.0000 mg | ORAL_TABLET | Freq: Three times a day (TID) | ORAL | 6 refills | Status: DC

## 2019-02-03 MED ORDER — SULFASALAZINE 500 MG PO TABS: 500.0000 mg | ORAL_TABLET | Freq: Three times a day (TID) | ORAL | 6 refills | Status: DC

## 2019-02-03 NOTE — Telephone Encounter (Signed)
Its too bad her insurance company won't cover a mesalamine medicine.  Please prescribe sulfasalazine 528m pills, two pills TID, disp 180, refills 6.  ROV with me as currently scheduled and she needs cmet, cbc a day prior to that appt.  Thanks

## 2019-02-03 NOTE — Telephone Encounter (Signed)
Patient informed that Sulfasalazine was sent into the pharmacy. Patient informed to come in the day before appointment next month for blood draw. Patient voiced understanding.

## 2019-02-24 ENCOUNTER — Other Ambulatory Visit (INDEPENDENT_AMBULATORY_CARE_PROVIDER_SITE_OTHER): Payer: 59

## 2019-02-24 DIAGNOSIS — K52832 Lymphocytic colitis: Secondary | ICD-10-CM | POA: Diagnosis not present

## 2019-02-24 LAB — CBC WITH DIFFERENTIAL/PLATELET
Basophils Absolute: 0 10*3/uL (ref 0.0–0.1)
Basophils Relative: 0.8 % (ref 0.0–3.0)
Eosinophils Absolute: 0 10*3/uL (ref 0.0–0.7)
Eosinophils Relative: 0.9 % (ref 0.0–5.0)
HCT: 39.1 % (ref 36.0–46.0)
Hemoglobin: 13.2 g/dL (ref 12.0–15.0)
Lymphocytes Relative: 32.1 % (ref 12.0–46.0)
Lymphs Abs: 1.7 10*3/uL (ref 0.7–4.0)
MCHC: 33.7 g/dL (ref 30.0–36.0)
MCV: 90.9 fl (ref 78.0–100.0)
Monocytes Absolute: 0.4 10*3/uL (ref 0.1–1.0)
Monocytes Relative: 7.7 % (ref 3.0–12.0)
Neutro Abs: 3 10*3/uL (ref 1.4–7.7)
Neutrophils Relative %: 58.5 % (ref 43.0–77.0)
Platelets: 246 10*3/uL (ref 150.0–400.0)
RBC: 4.3 Mil/uL (ref 3.87–5.11)
RDW: 13.2 % (ref 11.5–15.5)
WBC: 5.2 10*3/uL (ref 4.0–10.5)

## 2019-02-24 LAB — COMPREHENSIVE METABOLIC PANEL
ALT: 20 U/L (ref 0–35)
AST: 19 U/L (ref 0–37)
Albumin: 4.4 g/dL (ref 3.5–5.2)
Alkaline Phosphatase: 102 U/L (ref 39–117)
BUN: 16 mg/dL (ref 6–23)
CO2: 24 mEq/L (ref 19–32)
Calcium: 9.3 mg/dL (ref 8.4–10.5)
Chloride: 107 mEq/L (ref 96–112)
Creatinine, Ser: 0.74 mg/dL (ref 0.40–1.20)
GFR: 79.16 mL/min (ref >60.00)
Glucose, Bld: 93 mg/dL (ref 70–99)
Potassium: 3.8 mEq/L (ref 3.5–5.1)
Sodium: 140 mEq/L (ref 135–145)
Total Bilirubin: 0.4 mg/dL (ref 0.2–1.2)
Total Protein: 6.9 g/dL (ref 6.0–8.3)

## 2019-02-25 ENCOUNTER — Ambulatory Visit: Payer: 59 | Admitting: Gastroenterology

## 2019-02-25 ENCOUNTER — Encounter: Payer: Self-pay | Admitting: Gastroenterology

## 2019-02-25 VITALS — BP 122/76 | HR 88 | Temp 98.1°F | Ht 60.5 in | Wt 131.0 lb

## 2019-02-25 DIAGNOSIS — K51019 Ulcerative (chronic) pancolitis with unspecified complications: Secondary | ICD-10-CM | POA: Diagnosis not present

## 2019-02-25 NOTE — Progress Notes (Signed)
Review of pertinent gastrointestinal problems: 1.Routine risk for colon cancer.Colonoscopy April 2018 showedno polyps. Internal hemorrhoids were noted. She was recommended to have repeat colonoscopy at 10-year interval for colon cancer screening. Colonoscopy 2008was normal. 2.  Chronic diarrhea led to colonoscopy November 2020.  Terminal ileum was normal.  There was mild erythema and granularity throughout the colon.  Biopsies were taken.  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  Budesonide was too expensive.  Imodium caused cramping in nausea but was quite effective even at low doses.  Tried to prescribe mesalamine January 2021 however ended up with sulfasalazine 1gram TID due to her insurance restrictions.   HPI: This is a very pleasant 64 year old woman whom I last saw for 5 weeks ago.  I tried to start her on mesalamine orally however there were some insurance restrictions that we were battling and in the end I started her on sulfasalazine 1 g 3 times daily.  She has been on that for about 3 weeks.  She has noticed a clear improvement in her bowels overall.  The cramping is gone she is not bothered by any abdominal pains.  Her bowels are really back to her normal which is 2-3 sometimes 4 soft bowel movements daily.  Sometimes she has unformed soft stools.  She is having no bleeding.  She really is pretty happy with everything overall.  Chief complaint is ulcerative colitis  ROS: complete GI ROS as described in HPI, all other review negative.  Constitutional:  No unintentional weight loss   Past Medical History:  Diagnosis Date  . Allergy   . Arthritis    BACK   . Cancer (HCC)    UTERINE   . Cataract    MILD  . GERD (gastroesophageal reflux disease)   . Hyperlipidemia   . Hypertension   . Iritis    HLA B27 +  . Prediabetes   . S/P ORIF (open reduction internal fixation) fracture 1994   Right  tib/fib  . Thyroiditis, subacute   . Vitamin D deficiency     Past Surgical History:  Procedure Laterality Date  . CESAREAN SECTION    . COLONOSCOPY  03/17/2005   HEMS   . KNEE ARTHROSCOPY Left 1972  . right leg repair  1994   rod   . ROBOTIC ASSISTED TOTAL HYSTERECTOMY  2015   pt. states she had robot assisted hysterectomy and uterus was removed abdominally due to scar tissue.  she also states that both tubes and ovaries were removed.  . STAPEDECTOMY Left 2003  . TONSILLECTOMY AND ADENOIDECTOMY      Current Outpatient Medications  Medication Sig Dispense Refill  . aspirin EC 81 MG tablet Take 81 mg by mouth daily.    Marland Kitchen CALCIUM PO Take 600 mg by mouth daily.    . cetirizine (ZYRTEC) 10 MG tablet Take 10 mg by mouth daily.    . Cholecalciferol (VITAMIN D PO) Take 5,000 Int'l Units by mouth daily.    . Cyanocobalamin (VITAMIN B-12) 5000 MCG SUBL Place 5,000 mcg under the tongue daily.    . enalapril (VASOTEC) 20 MG tablet Take 1 tablet Daily for BP 90 tablet 3  . Flaxseed Oil (LINSEED OIL) OIL Take 1,200 mg by mouth 2 (two) times daily.     . hyoscyamine (LEVBID) 0.375 MG 12 hr tablet Take 1 tablet (0.375 mg total) by mouth 2 (two) times daily. (Patient taking differently: Take 0.375 mg by mouth as needed. )  60 tablet 3  . Hyoscyamine Sulfate SL (LEVSIN/SL) 0.125 MG SUBL Place 0.125 mg under the tongue every 4 (four) hours as needed. For cramping and nausea. (Patient taking differently: Place 0.125 mg under the tongue as needed. For cramping and nausea.) 90 tablet 0  . Multiple Vitamin (MULTIVITAMIN) capsule Take 1 capsule by mouth daily.    . Probiotic Product (PROBIOTIC DAILY PO) Take by mouth daily.    . simvastatin (ZOCOR) 40 MG tablet Take 1 tablet at Bedtime for Cholesterol 90 tablet 1  . sulfaSALAzine (AZULFIDINE) 500 MG tablet Take 2 tablets (1,000 mg total) by mouth 3 (three) times daily. 180 tablet 6   No current facility-administered medications for this visit.     Allergies as of 02/25/2019  . (No Known Allergies)    Family History  Problem Relation Age of Onset  . Cancer Mother        uterine, kidney  . Hypertension Mother   . Colon polyps Mother   . Stomach cancer Mother   . Diabetes Father   . Hypertension Father   . Stroke Father   . Cancer Father        kidney  . Colon cancer Maternal Grandmother   . Colon cancer Maternal Grandfather   . Esophageal cancer Neg Hx   . Rectal cancer Neg Hx     Social History   Socioeconomic History  . Marital status: Married    Spouse name: Not on file  . Number of children: 1  . Years of education: Not on file  . Highest education level: Not on file  Occupational History  . Not on file  Tobacco Use  . Smoking status: Never Smoker  . Smokeless tobacco: Never Used  Substance and Sexual Activity  . Alcohol use: No  . Drug use: No  . Sexual activity: Not on file  Other Topics Concern  . Not on file  Social History Narrative  . Not on file   Social Determinants of Health   Financial Resource Strain:   . Difficulty of Paying Living Expenses: Not on file  Food Insecurity:   . Worried About Charity fundraiser in the Last Year: Not on file  . Ran Out of Food in the Last Year: Not on file  Transportation Needs:   . Lack of Transportation (Medical): Not on file  . Lack of Transportation (Non-Medical): Not on file  Physical Activity:   . Days of Exercise per Week: Not on file  . Minutes of Exercise per Session: Not on file  Stress:   . Feeling of Stress : Not on file  Social Connections:   . Frequency of Communication with Friends and Family: Not on file  . Frequency of Social Gatherings with Friends and Family: Not on file  . Attends Religious Services: Not on file  . Active Member of Clubs or Organizations: Not on file  . Attends Archivist Meetings: Not on file  . Marital Status: Not on file  Intimate Partner Violence:   . Fear of Current or Ex-Partner: Not on file   . Emotionally Abused: Not on file  . Physically Abused: Not on file  . 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  Labs yesterday were completely normal including a CBC in complete metabolic profile.  She will continue taking the sulfasalazine 3 times daily and will return to see me in 6 months and sooner if needed.  I expect she will notice even more continued improvement, I am hoping that she will have 1 or 2 soft formed stools daily.  Please see the "Patient Instructions" section for addition details about the plan.  Owens Loffler, MD [facility] 02/25/2019, 8:58 AM   Total time on date of encounter was 21 minutes (this included time spent preparing to see the patient reviewing records; obtaining and/or reviewing separately obtained history; performing a medically appropriate exam and/or evaluation; counseling and educating the patient and family if present; ordering medications, tests or procedures if applicable; and documenting clinical information in the health record).

## 2019-02-25 NOTE — Patient Instructions (Addendum)
If you are age 64 or older, your body mass index should be between 23-30. Your Body mass index is 25.16 kg/m. If this is out of the aforementioned range listed, please consider follow up with your Primary Care Provider.  If you are age 19 or younger, your body mass index should be between 19-25. Your Body mass index is 25.16 kg/m. If this is out of the aformentioned range listed, please consider follow up with your Primary Care Provider.   We have sent the following medications to your pharmacy for you to pick up at your convenience: Sulfasalazine 500 mg 2 tablets three times daily.    Follow up in 6 months or sooner if needed.  Thank you for entrusting me with your care and choosing [facility].  Dr [name]

## 2019-03-13 ENCOUNTER — Telehealth: Payer: Self-pay | Admitting: Gastroenterology

## 2019-03-13 NOTE — Telephone Encounter (Signed)
Pt stated that she has called around and no pharmacy has sulfasalazine in stock.

## 2019-03-17 MED ORDER — SULFASALAZINE 500 MG PO TABS: 1000.0000 mg | ORAL_TABLET | Freq: Three times a day (TID) | ORAL | 6 refills | Status: DC

## 2019-03-17 NOTE — Telephone Encounter (Signed)
Can you please check around at a [facility] and then also [facility] outpatient pharmacy.  It's been a real struggle finding he a medicine that is covered by her insurance and that works for her GI problems.  Thanks. Let me know what you find.

## 2019-03-17 NOTE — Telephone Encounter (Signed)
[facility].  Made patient aware that we will send Rx to [facility]. Rx sent. Patient agreed to plan and verbalized understanding.  No further questions.

## 2019-04-03 LAB — HM DIABETES EYE EXAM

## 2019-04-08 ENCOUNTER — Encounter: Payer: Self-pay | Admitting: *Deleted

## 2019-05-12 ENCOUNTER — Encounter: Payer: Self-pay | Admitting: Internal Medicine

## 2019-05-12 NOTE — Patient Instructions (Signed)

## 2019-05-12 NOTE — Progress Notes (Signed)
Annual Screening/Preventative Visit & Comprehensive Evaluation &  Examination     This very nice 64 y.o. MWF  presents for a Screening /Preventative Visit & comprehensive evaluation and management of multiple medical co-morbidities.  Patient has been followed for HTN, HLD, Prediabetes  and Vitamin D Deficiency.     In Nov 2020, patient was dx'd by Dr [name] with Lymphocytic Colitis suspicious for mild ulcerative Colitis & started on Azulfidine.      Labile HTN predates since 2010 and is monitored expectantly. Patient's BP has been controlled at home and patient denies any cardiac symptoms as chest pain, palpitations, shortness of breath, dizziness or ankle swelling. Today's BP is at goal -138/80.      Patient's hyperlipidemia is controlled with diet and  Simvastatyin. Patient denies myalgias or other medication SE's. Last lipids were at goal except slightly elevated Trig's:  Lab Results  Component Value Date   CHOL 145 04/01/2018   HDL 41 (L) 04/01/2018   LDLCALC 74 04/01/2018   TRIG 199 (H) 04/01/2018   CHOLHDL 3.5 04/01/2018       Patient has hx/o prediabetes(A1c 5.8% / 2014) and patient denies reactive hypoglycemic symptoms, visual blurring, diabetic polys or paresthesias. Last A1c was Normal & at goal:  Lab Results  Component Value Date   HGBA1C 5.5 04/01/2018       Finally, patient has history of Vitamin D Deficiency and last Vitamin D was at goal:  Lab Results  Component Value Date   VD25OH 92 08/21/2017    Current Outpatient Medications on File Prior to Visit  Medication Sig  . aspirin EC 81 MG tablet Take 81 mg by mouth daily.  Marland Kitchen CALCIUM PO Take 600 mg by mouth daily.  . cetirizine (ZYRTEC) 10 MG tablet Take 10 mg by mouth daily.  . Cholecalciferol (VITAMIN D PO) Take 5,000 Int'l Units by mouth daily.  . Cyanocobalamin (VITAMIN B-12) 5000 MCG SUBL Place 5,000 mcg under the tongue daily.  . enalapril (VASOTEC) 20 MG tablet Take 1 tablet Daily for BP  . Flaxseed  Oil (LINSEED OIL) OIL Take 1,200 mg by mouth 2 (two) times daily.   . hyoscyamine (LEVBID) 0.375 MG 12 hr tablet Take 1 tablet (0.375 mg total) by mouth 2 (two) times daily. (Patient taking differently: Take 0.375 mg by mouth as needed. )  . Hyoscyamine Sulfate SL (LEVSIN/SL) 0.125 MG SUBL Place 0.125 mg under the tongue every 4 (four) hours as needed. For cramping and nausea. (Patient taking differently: Place 0.125 mg under the tongue as needed. For cramping and nausea.)  . Multiple Vitamin (MULTIVITAMIN) capsule Take 1 capsule by mouth daily.  . Probiotic Product (PROBIOTIC DAILY PO) Take by mouth daily.  . simvastatin (ZOCOR) 40 MG tablet Take 1 tablet at Bedtime for Cholesterol  . sulfaSALAzine (AZULFIDINE) 500 MG tablet Take 2 tablets (1,000 mg total) by mouth 3 (three) times daily.   No current facility-administered medications on file prior to visit.   No Active Allergies   Past Medical History:  Diagnosis Date  . Allergy   . Arthritis    BACK   . Cancer (HCC)    UTERINE   . Cataract    MILD  . GERD (gastroesophageal reflux disease)   . Hyperlipidemia   . Hypertension   . Iritis    HLA B27 +  . Prediabetes   . S/P ORIF (open reduction internal fixation) fracture 1994   Right tib/fib  . Thyroiditis, subacute   . Vitamin D deficiency  Health Maintenance  Topic Date Due  . TETANUS/TDAP  07/17/2017  . MAMMOGRAM  03/22/2018  . PAP SMEAR-Modifier  03/25/2019  . INFLUENZA VACCINE  08/17/2019  . COLONOSCOPY  11/28/2028  . COVID-19 Vaccine  Completed  . Hepatitis C Screening  Completed  . HIV Screening  Completed   Immunization History  Administered Date(s) Administered  . Influenza Inj Mdck Quad With Preservative 12/03/2017, 11/13/2018  . Janssen (J&J) SARS-COV-2 Vaccination 04/23/2019  . PPD Test 09/30/2013, 05/13/2019  . Pneumococcal-Unspecified 08/27/2008  . Tdap 07/18/2007  . Zoster 12/29/2015    Last Colon - Nov 2020 - Dr [name]  Last [facility] - Dr [name]  office  Past Surgical History:  Procedure Laterality Date  . CESAREAN SECTION    . COLONOSCOPY  03/17/2005   HEMS   . KNEE ARTHROSCOPY Left 1972  . right leg repair  1994   rod   . ROBOTIC ASSISTED TOTAL HYSTERECTOMY  2015   pt. states she had robot assisted hysterectomy and uterus was removed abdominally due to scar tissue.  she also states that both tubes and ovaries were removed.  . STAPEDECTOMY Left 2003  . TONSILLECTOMY AND ADENOIDECTOMY     Family History  Problem Relation Age of Onset  . Cancer Mother        uterine, kidney  . Hypertension Mother   . Colon polyps Mother   . Stomach cancer Mother   . Diabetes Father   . Hypertension Father   . Stroke Father   . Cancer Father        kidney  . Colon cancer Maternal Grandmother   . Colon cancer Maternal Grandfather   . Esophageal cancer Neg Hx   . Rectal cancer Neg Hx    Social History   Tobacco Use  . Smoking status: Never Smoker  . Smokeless tobacco: Never Used  Substance Use Topics  . Alcohol use: No  . Drug use: No    ROS Constitutional: Denies fever, chills, weight loss/gain, headaches, insomnia,  night sweats, and change in appetite. Does c/o fatigue. Eyes: Denies redness, blurred vision, diplopia, discharge, itchy, watery eyes.  ENT: Denies discharge, congestion, post nasal drip, epistaxis, sore throat, earache, hearing loss, dental pain, Tinnitus, Vertigo, Sinus pain, snoring.  Cardio: Denies chest pain, palpitations, irregular heartbeat, syncope, dyspnea, diaphoresis, orthopnea, PND, claudication, edema Respiratory: denies cough, dyspnea, DOE, pleurisy, hoarseness, laryngitis, wheezing.  Gastrointestinal: Denies dysphagia, heartburn, reflux, water brash, pain, cramps, nausea, vomiting, bloating, diarrhea, constipation, hematemesis, melena, hematochezia, jaundice, hemorrhoids Genitourinary: Denies dysuria, frequency, urgency, nocturia, hesitancy, discharge, hematuria, flank pain Breast: Breast lumps, nipple  discharge, bleeding.  Musculoskeletal: Denies arthralgia, myalgia, stiffness, Jt. Swelling, pain, limp, and strain/sprain. Denies falls. Skin: Denies puritis, rash, hives, warts, acne, eczema, changing in skin lesion Neuro: No weakness, tremor, incoordination, spasms, paresthesia, pain Psychiatric: Denies confusion, memory loss, sensory loss. Denies Depression. Endocrine: Denies change in weight, skin, hair change, nocturia, and paresthesia, diabetic polys, visual blurring, hyper / hypo glycemic episodes.  Heme/Lymph: No excessive bleeding, bruising, enlarged lymph nodes.  Physical Exam  BP 138/80   Pulse 76   Temp (!) 97 F (36.1 C)   Resp 16   Ht 5' 0.75" (1.543 m)   Wt 126 lb 12.8 oz (57.5 kg)   BMI 24.16 kg/m   General Appearance: Well nourished, well groomed and in no apparent distress.  Eyes: PERRLA, EOMs, conjunctiva no swelling or erythema, normal fundi and vessels. Sinuses: No frontal/maxillary tenderness ENT/Mouth: EACs patent / TMs  nl. Nares  clear without erythema, swelling, mucoid exudates. Oral hygiene is good. No erythema, swelling, or exudate. Tongue normal, non-obstructing. Tonsils not swollen or erythematous. Hearing normal.  Neck: Supple, thyroid not palpable. No bruits, nodes or JVD. Respiratory: Respiratory effort normal.  BS equal and clear bilateral without rales, rhonci, wheezing or stridor. Cardio: Heart sounds are normal with regular rate and rhythm and no murmurs, rubs or gallops. Peripheral pulses are normal and equal bilaterally without edema. No aortic or femoral bruits. Chest: symmetric with normal excursions and percussion. Breasts: Symmetric, without lumps, nipple discharge, retractions, or fibrocystic changes.  Abdomen: Flat, soft with bowel sounds active. Nontender, no guarding, rebound, hernias, masses, or organomegaly.  Lymphatics: Non tender without lymphadenopathy.  Genitourinary:  Musculoskeletal: Full ROM all peripheral extremities, joint  stability, 5/5 strength, and normal gait. Skin: Warm and dry without rashes, lesions, cyanosis, clubbing or  ecchymosis.  Neuro: Cranial nerves intact, reflexes equal bilaterally. Normal muscle tone, no cerebellar symptoms. Sensation intact.  Pysch: Alert and oriented X 3, normal affect, Insight and Judgment appropriate.   Assessment and Plan  1. Annual Preventative Screening Examination  2. Labile hypertension  - EKG 12-Lead - [place], RETROPERITNL ABD,  LTD - Urinalysis, Routine w reflex microscopic - Microalbumin / creatinine urine ratio - CBC with Differential/Platelet - COMPLETE METABOLIC PANEL WITH GFR - Magnesium - TSH  3. Hyperlipidemia, mixed  - EKG 12-Lead - [place], RETROPERITNL ABD,  LTD - Lipid panel - TSH  4. Abnormal glucose  - EKG 12-Lead - [place], RETROPERITNL ABD,  LTD - Hemoglobin A1c - Insulin, random  5. Vitamin D deficiency  - VITAMIN D 25 Hydroxy  6. Prediabetes  - EKG 12-Lead - [place], RETROPERITNL ABD,  LTD - Hemoglobin A1c - Insulin, random  7. Ulcerative colitis without complications  (HCC)  - Sedimentation rate  8. Screening-pulmonary TB  - TB Skin Test  9. Screening for ischemic heart disease  - EKG 12-Lead  10. FH: heart disease  - EKG 12-Lead - [place], RETROPERITNL ABD,  LTD  11. Screening for AAA (aortic abdominal aneurysm)  - [place], RETROPERITNL ABD,  LTD  12. Fatigue  - Iron,Total/Total Iron Binding Cap - Vitamin B12 - CBC with Differential/Platelet - TSH  13. Medication management  - Urinalysis, Routine w reflex microscopic - Microalbumin / creatinine urine ratio - CBC with Differential/Platelet - COMPLETE METABOLIC PANEL WITH GFR - Magnesium - Lipid panel - TSH - Hemoglobin A1c - Insulin, random - VITAMIN D 25 Hydroxy         Patient was counseled in prudent diet to achieve/maintain BMI less than 25 for weight control, BP monitoring, regular exercise and medications. Discussed med's effects and SE's. Screening labs and  tests as requested with regular follow-up as recommended. Over 40 minutes of exam, counseling, chart review and high complex critical decision making was performed.   Kirtland Bouchard, MD

## 2019-05-13 ENCOUNTER — Ambulatory Visit (INDEPENDENT_AMBULATORY_CARE_PROVIDER_SITE_OTHER): Payer: 59 | Admitting: Internal Medicine

## 2019-05-13 ENCOUNTER — Other Ambulatory Visit: Payer: Self-pay

## 2019-05-13 VITALS — BP 138/80 | HR 76 | Temp 97.0°F | Resp 16 | Ht 60.75 in | Wt 126.8 lb

## 2019-05-13 DIAGNOSIS — R7309 Other abnormal glucose: Secondary | ICD-10-CM

## 2019-05-13 DIAGNOSIS — K5190 Ulcerative colitis, unspecified, without complications: Secondary | ICD-10-CM

## 2019-05-13 DIAGNOSIS — Z13220 Encounter for screening for lipoid disorders: Secondary | ICD-10-CM | POA: Diagnosis not present

## 2019-05-13 DIAGNOSIS — Z79899 Other long term (current) drug therapy: Secondary | ICD-10-CM | POA: Diagnosis not present

## 2019-05-13 DIAGNOSIS — Z131 Encounter for screening for diabetes mellitus: Secondary | ICD-10-CM

## 2019-05-13 DIAGNOSIS — Z8249 Family history of ischemic heart disease and other diseases of the circulatory system: Secondary | ICD-10-CM

## 2019-05-13 DIAGNOSIS — R0989 Other specified symptoms and signs involving the circulatory and respiratory systems: Secondary | ICD-10-CM | POA: Diagnosis not present

## 2019-05-13 DIAGNOSIS — Z0001 Encounter for general adult medical examination with abnormal findings: Secondary | ICD-10-CM

## 2019-05-13 DIAGNOSIS — Z130 Encounter for screening for diseases of the blood and blood-forming organs and certain disorders involving the immune mechanism: Secondary | ICD-10-CM | POA: Diagnosis not present

## 2019-05-13 DIAGNOSIS — E559 Vitamin D deficiency, unspecified: Secondary | ICD-10-CM

## 2019-05-13 DIAGNOSIS — Z111 Encounter for screening for respiratory tuberculosis: Secondary | ICD-10-CM | POA: Diagnosis not present

## 2019-05-13 DIAGNOSIS — Z1389 Encounter for screening for other disorder: Secondary | ICD-10-CM | POA: Diagnosis not present

## 2019-05-13 DIAGNOSIS — Z136 Encounter for screening for cardiovascular disorders: Secondary | ICD-10-CM | POA: Diagnosis not present

## 2019-05-13 DIAGNOSIS — Z0000 Encounter for general adult medical examination without abnormal findings: Secondary | ICD-10-CM | POA: Diagnosis not present

## 2019-05-13 DIAGNOSIS — R5383 Other fatigue: Secondary | ICD-10-CM

## 2019-05-13 DIAGNOSIS — R7303 Prediabetes: Secondary | ICD-10-CM

## 2019-05-13 DIAGNOSIS — E782 Mixed hyperlipidemia: Secondary | ICD-10-CM

## 2019-05-14 LAB — TSH: TSH: 1.56 mIU/L (ref 0.40–4.50)

## 2019-05-14 LAB — VITAMIN D 25 HYDROXY (VIT D DEFICIENCY, FRACTURES): Vit D, 25-Hydroxy: 80 ng/mL (ref 30–100)

## 2019-05-14 LAB — CBC WITH DIFFERENTIAL/PLATELET
Absolute Monocytes: 454 cells/uL (ref 200–950)
Basophils Absolute: 31 cells/uL (ref 0–200)
Basophils Relative: 0.6 %
Eosinophils Absolute: 41 cells/uL (ref 15–500)
Eosinophils Relative: 0.8 %
HCT: 41.9 % (ref 35.0–45.0)
Hemoglobin: 13.8 g/dL (ref 11.7–15.5)
Lymphs Abs: 1637 cells/uL (ref 850–3900)
MCH: 30.8 pg (ref 27.0–33.0)
MCHC: 32.9 g/dL (ref 32.0–36.0)
MCV: 93.5 fL (ref 80.0–100.0)
MPV: 9.6 fL (ref 7.5–12.5)
Monocytes Relative: 8.9 %
Neutro Abs: 2938 cells/uL (ref 1500–7800)
Neutrophils Relative %: 57.6 %
Platelets: 237 10*3/uL (ref 140–400)
RBC: 4.48 10*6/uL (ref 3.80–5.10)
RDW: 12.7 % (ref 11.0–15.0)
Total Lymphocyte: 32.1 %
WBC: 5.1 10*3/uL (ref 3.8–10.8)

## 2019-05-14 LAB — COMPLETE METABOLIC PANEL WITH GFR
AG Ratio: 2.1 (calc) (ref 1.0–2.5)
ALT: 24 U/L (ref 6–29)
AST: 21 U/L (ref 10–35)
Albumin: 4.5 g/dL (ref 3.6–5.1)
Alkaline phosphatase (APISO): 95 U/L (ref 37–153)
BUN: 12 mg/dL (ref 7–25)
CO2: 28 mmol/L (ref 20–32)
Calcium: 9.9 mg/dL (ref 8.6–10.4)
Chloride: 107 mmol/L (ref 98–110)
Creat: 0.6 mg/dL (ref 0.50–0.99)
GFR, Est African American: 112 mL/min/{1.73_m2} (ref >=60)
GFR, Est Non African American: 97 mL/min/{1.73_m2} (ref >=60)
Globulin: 2.1 g/dL (calc) (ref 1.9–3.7)
Glucose, Bld: 87 mg/dL (ref 65–99)
Potassium: 4.4 mmol/L (ref 3.5–5.3)
Sodium: 142 mmol/L (ref 135–146)
Total Bilirubin: 0.3 mg/dL (ref 0.2–1.2)
Total Protein: 6.6 g/dL (ref 6.1–8.1)

## 2019-05-14 LAB — VITAMIN B12: Vitamin B-12: >2000 pg/mL — ABNORMAL HIGH (ref 200–1100)

## 2019-05-14 LAB — MICROALBUMIN / CREATININE URINE RATIO
Creatinine, Urine: 17 mg/dL — ABNORMAL LOW (ref 20–275)
Microalb, Ur: <0.2 mg/dL

## 2019-05-14 LAB — LIPID PANEL
Cholesterol: 171 mg/dL (ref <200)
HDL: 40 mg/dL — ABNORMAL LOW (ref >=50)
LDL Cholesterol (Calc): 99 mg/dL (calc)
Non-HDL Cholesterol (Calc): 131 mg/dL (calc) — ABNORMAL HIGH (ref <130)
Total CHOL/HDL Ratio: 4.3 (calc) (ref <5.0)
Triglycerides: 199 mg/dL — ABNORMAL HIGH (ref <150)

## 2019-05-14 LAB — HEMOGLOBIN A1C
Hgb A1c MFr Bld: 5.2 % of total Hgb (ref <5.7)
Mean Plasma Glucose: 103 (calc)
eAG (mmol/L): 5.7 (calc)

## 2019-05-14 LAB — URINALYSIS, ROUTINE W REFLEX MICROSCOPIC
Bilirubin Urine: NEGATIVE
Glucose, UA: NEGATIVE
Hgb urine dipstick: NEGATIVE
Ketones, ur: NEGATIVE
Leukocytes,Ua: NEGATIVE
Nitrite: NEGATIVE
Protein, ur: NEGATIVE
Specific Gravity, Urine: 1.006 (ref 1.001–1.03)
pH: 6 (ref 5.0–8.0)

## 2019-05-14 LAB — SEDIMENTATION RATE: Sed Rate: 2 mm/h (ref 0–30)

## 2019-05-14 LAB — IRON, TOTAL/TOTAL IRON BINDING CAP
%SAT: 26 % (calc) (ref 16–45)
Iron: 86 ug/dL (ref 45–160)
TIBC: 335 mcg/dL (calc) (ref 250–450)

## 2019-05-14 LAB — INSULIN, RANDOM: Insulin: 7.1 u[IU]/mL

## 2019-05-14 LAB — MAGNESIUM: Magnesium: 2.3 mg/dL (ref 1.5–2.5)

## 2019-05-15 LAB — TB SKIN TEST
Induration: 0 mm
TB Skin Test: NEGATIVE

## 2019-06-02 ENCOUNTER — Telehealth: Payer: Self-pay | Admitting: Gastroenterology

## 2019-06-02 NOTE — Telephone Encounter (Signed)
Left message on machine to call back  

## 2019-06-02 NOTE — Telephone Encounter (Signed)
Patient called states the new medication is not working still having diarrhea

## 2019-06-02 NOTE — Telephone Encounter (Signed)
The pt has UC pan colitis and was started on Sulfasalazine two 500 mg TID in Feb of this year.  She continues to have nausea,  abd cramping and watery diarrhea 8-10 times daily.  Please advise next steps

## 2019-06-03 MED ORDER — MESALAMINE 1.2 G PO TBEC
4.8000 g | DELAYED_RELEASE_TABLET | Freq: Every day | ORAL | 6 refills | Status: DC
Start: 2019-06-03 — End: 2019-06-04

## 2019-06-03 NOTE — Telephone Encounter (Signed)
Ok, lets change to lialda 1.2gm pills, 4 pills once daily, disp 1 month with 6 refills. OV with me or extender in 5-6 weeks. Also lets have her get GI pathogen panel, it actually seems like she's having more loose stools daily than several months ago.  Thanks

## 2019-06-03 NOTE — Telephone Encounter (Signed)
The pt has been advised to stop sulfasalazine and start Lialda.  She was scheduled to see Dr [name] on 7/2 for follow up.  The pt has been advised of the information and verbalized understanding.

## 2019-06-03 NOTE — Telephone Encounter (Signed)
Pt called to inform that copay for Lialda is over $250/month and pt cannot afford it. She would like another alternative.

## 2019-06-03 NOTE — Telephone Encounter (Signed)
Please see pt response to lialda cost.

## 2019-06-04 ENCOUNTER — Other Ambulatory Visit: Payer: Self-pay

## 2019-06-04 ENCOUNTER — Encounter: Payer: Self-pay | Admitting: Gastroenterology

## 2019-06-04 MED ORDER — MESALAMINE ER 0.375 G PO CP24: 1.5000 g | ORAL_CAPSULE | Freq: Every day | ORAL | 6 refills | Status: DC

## 2019-06-04 NOTE — Telephone Encounter (Signed)
OPENED IN ERROR

## 2019-06-04 NOTE — Telephone Encounter (Signed)
Dr [name] is balsalazide ok for her to try?

## 2019-06-04 NOTE — Telephone Encounter (Signed)
The pt will call insurance and see which mesalamine is covered.  She will call back with that information.

## 2019-06-04 NOTE — Telephone Encounter (Signed)
Pt reported that the prescription sent today is more expensive then the previous prescription.  She stated that it is $250/month.

## 2019-06-04 NOTE — Telephone Encounter (Signed)
The prescription has been sent as requested  Pt notified

## 2019-06-04 NOTE — Telephone Encounter (Signed)
Ok, lets try apriso, 4 pills once daily. Disp 1 month with 6 refills.   thanks

## 2019-06-05 ENCOUNTER — Other Ambulatory Visit: Payer: Self-pay

## 2019-06-05 MED ORDER — BALSALAZIDE DISODIUM 750 MG PO CAPS: 2250.0000 mg | ORAL_CAPSULE | Freq: Three times a day (TID) | ORAL | 3 refills | Status: DC

## 2019-06-05 NOTE — Telephone Encounter (Signed)
The pt has been advised that the prescription has been sent.

## 2019-06-05 NOTE — Telephone Encounter (Signed)
OK balsalazide 2.25 gms PO TID, disp one month with 11 refills.

## 2019-06-30 ENCOUNTER — Other Ambulatory Visit: Payer: Self-pay | Admitting: Adult Health

## 2019-06-30 DIAGNOSIS — R109 Unspecified abdominal pain: Secondary | ICD-10-CM

## 2019-06-30 DIAGNOSIS — R197 Diarrhea, unspecified: Secondary | ICD-10-CM

## 2019-07-18 ENCOUNTER — Ambulatory Visit (INDEPENDENT_AMBULATORY_CARE_PROVIDER_SITE_OTHER): Payer: 59 | Admitting: Gastroenterology

## 2019-07-18 ENCOUNTER — Encounter: Payer: Self-pay | Admitting: Gastroenterology

## 2019-07-18 VITALS — BP 122/76 | HR 76 | Ht 61.0 in | Wt 123.0 lb

## 2019-07-18 DIAGNOSIS — K529 Noninfective gastroenteritis and colitis, unspecified: Secondary | ICD-10-CM | POA: Diagnosis not present

## 2019-07-18 MED ORDER — PREDNISONE 20 MG PO TABS
20.0000 mg | ORAL_TABLET | Freq: Every day | ORAL | 3 refills | Status: DC
Start: 2019-07-18 — End: 2019-09-24

## 2019-07-18 NOTE — Progress Notes (Signed)
Review of pertinent gastrointestinal problems: 1.Routine risk for colon cancer.Colonoscopy April 2018 showedno polyps. Internal hemorrhoids were noted. She was recommended to have repeat colonoscopy at 10-year interval for colon cancer screening. Colonoscopy 2008was normal. 2.Chronic diarrhea led to colonoscopy November 2020.Terminal ileum was normal. There was mild erythema and granularity throughout the colon. Biopsies were taken. 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  Budesonide was too expensive.  Imodium caused cramping in nausea but was quite effective even at low doses.  Tried to prescribe mesalamine January 2021 however ended up with sulfasalazine 1gram TID due to her insurance restrictions.   HPI: This is a very pleasant 64 year old woman  I last saw her 5 months ago in the office.  That time she seemed to be responding to sulfasalazine 1 g 3 times daily.  She was going to continue taking that 3 times daily and return at 13-monthinterval.  She called about 6 weeks ago to tell me that she was not getting better however.  She was still having abdominal pain and watery diarrhea 8-10 times daily.  I try to get her on Lialda 1.2 g 4 pills once daily however this was too expensive.  I try to get her on Apriso 4 pills once daily and it was also too expensive.  In the end we called in balsalazide 2.25 g p.o. 3 times daily as that was the one that her insurance company seem to be helping with the most.  She is still having 8-10 loose stools daily.  These are not bloody.  She does have minor blood on tissue paper when wiping.  She has mild cramping in her abdomen.  No nausea or vomiting.  She has been very good about taking the balsalazide 3 times daily.  ROS: complete GI ROS as described in HPI, all other review negative.  Constitutional:  No unintentional weight loss   Past Medical History:   Diagnosis Date  . Allergy   . Arthritis    BACK   . Cancer (HCC)    UTERINE   . Cataract    MILD  . GERD (gastroesophageal reflux disease)   . Hyperlipidemia   . Hypertension   . Iritis    HLA B27 +  . Prediabetes   . S/P ORIF (open reduction internal fixation) fracture 1994   Right tib/fib  . Thyroiditis, subacute   . Vitamin D deficiency     Past Surgical History:  Procedure Laterality Date  . CESAREAN SECTION    . COLONOSCOPY  03/17/2005   HEMS   . KNEE ARTHROSCOPY Left 1972  . right leg repair  1994   rod   . ROBOTIC ASSISTED TOTAL HYSTERECTOMY  2015   pt. states she had robot assisted hysterectomy and uterus was removed abdominally due to scar tissue.  she also states that both tubes and ovaries were removed.  . STAPEDECTOMY Left 2003  . TONSILLECTOMY AND ADENOIDECTOMY      Current Outpatient Medications  Medication Sig Dispense Refill  . aspirin EC 81 MG tablet Take 81 mg by mouth daily.    .Marland KitchenCALCIUM PO Take 600 mg by mouth daily.    . cetirizine (ZYRTEC) 10 MG tablet Take 10 mg by mouth daily.    . Cholecalciferol (VITAMIN D PO) Take 5,000 Int'l Units by mouth daily.    . Cyanocobalamin (VITAMIN B-12) 5000 MCG SUBL Place 5,000 mcg under the tongue daily.    . enalapril (VASOTEC)  20 MG tablet Take 1 tablet Daily for BP 90 tablet 3  . Flaxseed Oil (LINSEED OIL) OIL Take 1,200 mg by mouth 2 (two) times daily.     . hyoscyamine (LEVBID) 0.375 MG 12 hr tablet Take 1 tablet (0.375 mg total) by mouth 2 (two) times daily. (Patient taking differently: Take 0.375 mg by mouth as needed. ) 60 tablet 3  . hyoscyamine (LEVSIN SL) 0.125 MG SL tablet PLACE 1 TABLET UNDER THE TONGUE EVERY 4 HOURS AS NEEDED FOR CRAMPING AND NAUSEA 90 tablet 0  . Multiple Vitamin (MULTIVITAMIN) capsule Take 1 capsule by mouth daily.    . Probiotic Product (PROBIOTIC DAILY PO) Take by mouth daily.    . simvastatin (ZOCOR) 40 MG tablet Take 1 tablet at Bedtime for Cholesterol 90 tablet 1  .  balsalazide (COLAZAL) 750 MG capsule Take 3 capsules (2,250 mg total) by mouth 3 (three) times daily. 270 capsule 3   No current facility-administered medications for this visit.    Allergies as of 07/18/2019  . (No Active Allergies)    Family History  Problem Relation Age of Onset  . Cancer Mother        uterine, kidney  . Hypertension Mother   . Colon polyps Mother   . Stomach cancer Mother   . Diabetes Father   . Hypertension Father   . Stroke Father   . Cancer Father        kidney  . Colon cancer Maternal Grandmother   . Colon cancer Maternal Grandfather   . Esophageal cancer Neg Hx   . Rectal cancer Neg Hx     Social History   Socioeconomic History  . Marital status: Married    Spouse name: Not on file  . Number of children: 1  . Years of education: Not on file  . Highest education level: Not on file  Occupational History  . Not on file  Tobacco Use  . Smoking status: Never Smoker  . Smokeless tobacco: Never Used  Vaping Use  . Vaping Use: Never used  Substance and Sexual Activity  . Alcohol use: No  . Drug use: No  . Sexual activity: Not on file  Other Topics Concern  . Not on file  Social History Narrative  . Not on file   Social Determinants of Health   Financial Resource Strain:   . Difficulty of Paying Living Expenses:   Food Insecurity:   . Worried About Charity fundraiser in the Last Year:   . Arboriculturist in the Last Year:   Transportation Needs:   . Film/video editor (Medical):   Marland Kitchen Lack of Transportation (Non-Medical):   Physical Activity:   . Days of Exercise per Week:   . Minutes of Exercise per Session:   Stress:   . Feeling of Stress :   Social Connections:   . Frequency of Communication with Friends and Family:   . Frequency of Social Gatherings with Friends and Family:   . Attends Religious Services:   . Active Member of Clubs or Organizations:   . Attends Archivist Meetings:   Marland Kitchen Marital Status:    Intimate Partner Violence:   . Fear of Current or Ex-Partner:   . Emotionally Abused:   Marland Kitchen Physically Abused:   . Sexually Abused:      Physical Exam: BP 122/76   Pulse 76   Ht 5' 1"  (1.549 m)   Wt 123 lb (55.8 kg)   BMI  23.24 kg/m  Constitutional: generally well-appearing Psychiatric: alert and oriented x3 Abdomen: soft, nontender, nondistended, no obvious ascites, no peritoneal signs, normal bowel sounds No peripheral edema noted in lower extremities  Assessment and plan: 64 y.o. female with chronic diarrhea  She is not really responding to sulfasalazine or mesalamine.  Perhaps she indeed has lymphocytic colitis as was suggested by the pathology report however endoscopically she seemed to have some macroscopic mild inflammation.  She was never able to get budesonide which was my original choice because of cost issues.  Today I am going to put her on oral prednisone.  20 mg once daily.  She is going to call to report on her response to this in 3 to 4 weeks.  She will not taper prior to hearing back from me after she reports on her response.  She will continue taking her balsalazide as it is currently scheduled.  We will arrange a return office visit for about 2 months from now but we will be communicating with her prior to then undoubtedly  Please see the "Patient Instructions" section for addition details about the plan.  Owens Loffler, MD [facility] 07/18/2019, 10:46 AM   Total time on date of encounter was 30 minutes (this included time spent preparing to see the patient reviewing records; obtaining and/or reviewing separately obtained history; performing a medically appropriate exam and/or evaluation; counseling and educating the patient and family if present; ordering medications, tests or procedures if applicable; and documenting clinical information in the health record).

## 2019-07-18 NOTE — Patient Instructions (Addendum)
If you are age 64 or older, your body mass index should be between 23-30. Your Body mass index is 23.24 kg/m. If this is out of the aforementioned range listed, please consider follow up with your Primary Care Provider.  If you are age 72 or younger, your body mass index should be between 19-25. Your Body mass index is 23.24 kg/m. If this is out of the aformentioned range listed, please consider follow up with your Primary Care Provider.    We have sent the following medications to your pharmacy for you to pick up at your convenience:  START: prednisone 31m take one tablet daily.  Please call our office in 3 weeks to let u[place]ore[place]now how you are feeling.  You are scheduled to follow up in the office on 09-24-19 at 9:10am.  Thank you for entrusting me with your care and [facility]  Dr JArdis Hughs

## 2019-08-08 ENCOUNTER — Telehealth: Payer: Self-pay | Admitting: Gastroenterology

## 2019-08-08 NOTE — Telephone Encounter (Signed)
Dr [name] the pt wants to let you know that she is doing better. Stools are not as watery and less frequent.

## 2019-08-11 NOTE — Telephone Encounter (Signed)
Glad to hear it. She should taper the prednisone. She should be on 34m daily. She needs to taper by 558mevery two weeks until she is completley off.  Thanks OV with me already set for early sept.

## 2019-08-11 NOTE — Telephone Encounter (Signed)
The pt has been advised and will taper prednisone as directed. She will keep appt as planned.

## 2019-08-14 ENCOUNTER — Ambulatory Visit: Payer: 59 | Admitting: Adult Health

## 2019-08-14 ENCOUNTER — Encounter: Payer: Self-pay | Admitting: Adult Health

## 2019-08-14 ENCOUNTER — Other Ambulatory Visit: Payer: Self-pay

## 2019-08-14 VITALS — BP 132/78 | HR 74 | Temp 97.3°F | Wt 124.6 lb

## 2019-08-14 DIAGNOSIS — I10 Essential (primary) hypertension: Secondary | ICD-10-CM

## 2019-08-14 DIAGNOSIS — R7309 Other abnormal glucose: Secondary | ICD-10-CM | POA: Diagnosis not present

## 2019-08-14 DIAGNOSIS — Z79899 Other long term (current) drug therapy: Secondary | ICD-10-CM | POA: Diagnosis not present

## 2019-08-14 DIAGNOSIS — E782 Mixed hyperlipidemia: Secondary | ICD-10-CM | POA: Diagnosis not present

## 2019-08-14 DIAGNOSIS — E559 Vitamin D deficiency, unspecified: Secondary | ICD-10-CM

## 2019-08-14 DIAGNOSIS — Z6823 Body mass index (BMI) 23.0-23.9, adult: Secondary | ICD-10-CM

## 2019-08-14 NOTE — Progress Notes (Signed)
FOLLOW UP  Assessment and Plan:   Hypertension Fairly controlled with current medications  Monitor blood pressure at home; patient to call if consistently greater than 130/80 Continue DASH diet.   Reminder to go to the ER if any CP, SOB, nausea, dizziness, severe HA, changes vision/speech, left arm numbness and tingling and jaw pain.  Cholesterol Currently at LDL goal; continue statin  Diet for mild trig elevation reviewed Continue low cholesterol diet and exercise.  Check lipid panel.   Hx of prediabetes Continue diet and exercise.  Perform daily foot/skin check, notify office of any concerning changes.  Check A1C q40m check CMP today   BMi 23 Continue to recommend diet heavy in fruits and veggies and low in animal meats, cheeses, and dairy products, appropriate calorie intake Discuss exercise recommendations routinely Continue to monitor weight at each visit   Vitamin D Def At goal at last visit; continue supplementation to maintain goal of 70-100 Defer Vit D level  Continue diet and meds as discussed. Further disposition pending results of labs. Discussed med's effects and SE's.   Over 30 minutes of exam, counseling, chart review, and critical decision making was performed.   Future Appointments  Date Time Provider [place] 09/24/2019  9:10 AM JMilus Banister MD [facility] 11/19/2019 10:30 AM MUnk Pinto MD [facility] None  05/18/2020  3:00 PM MUnk Pinto MD [facility] None    ----------------------------------------------------------------------------------------------------------------------  HPI 64y.o. female  presents for 3 month follow up on hypertension, cholesterol, glucose, weight and vitamin D deficiency.   Hx of chronic diarrhea, Dr. JArdis Hughsis evaluating, colonoscopy 11/2018 showed some inflammation felt to be possibly lymphocytic colitis or mild ulceratic colitis. Cost prohibited budesonide; currently trying prednisone and  balsalazide since 07/18/19. Patient report diarrhea frequency is improved from 20 times daily to 4-10, still with liquid stool, has tapered prednisone down to 10 mg with maintained stool pattern. Has follow up in 1 month.   BMI is Body mass index is 23.54 kg/m., she has been working on diet and exercise, doing regular weights.  Wt Readings from Last 3 Encounters:  08/14/19 124 lb 9.6 oz (56.5 kg)  07/18/19 123 lb (55.8 kg)  05/13/19 126 lb 12.8 oz (57.5 kg)   Her blood pressure has been controlled at home (120-130s/60-70s), today their BP is BP: (!) 142/72  She does workout. She denies chest pain, shortness of breath, dizziness.   She is on cholesterol medication (simvastatin 20 mg daily) and denies myalgias. Her LDL cholesterol is at goal, trigs remain mildly elevated. The cholesterol last visit was:   Lab Results  Component Value Date   CHOL 171 05/13/2019   HDL 40 (L) 05/13/2019   LDLCALC 99 05/13/2019   TRIG 199 (H) 05/13/2019   CHOLHDL 4.3 05/13/2019    She has been working on diet and exercise for hx of prediabetes improved by lifestyle, and denies foot ulcerations, increased appetite, nausea, paresthesia of the feet, polydipsia, polyuria, visual disturbances, vomiting and weight loss. Last A1C in the office was:  Lab Results  Component Value Date   HGBA1C 5.2 05/13/2019   Patient is on Vitamin D supplement.   Lab Results  Component Value Date   VD25OH 80 05/13/2019        Current Medications:  Current Outpatient Medications on File Prior to Visit  Medication Sig   aspirin EC 81 MG tablet Take 81 mg by mouth daily.   balsalazide (COLAZAL) 750 MG capsule Take 3 capsules (2,250 mg  total) by mouth 3 (three) times daily.   CALCIUM PO Take 600 mg by mouth daily.   cetirizine (ZYRTEC) 10 MG tablet Take 10 mg by mouth daily.   Cholecalciferol (VITAMIN D PO) Take 5,000 Int'l Units by mouth daily.   Cyanocobalamin (VITAMIN B-12) 5000 MCG SUBL Place 5,000 mcg under the  tongue daily.   enalapril (VASOTEC) 20 MG tablet Take 1 tablet Daily for BP   Flaxseed Oil (LINSEED OIL) OIL Take 1,200 mg by mouth 2 (two) times daily.    hyoscyamine (LEVBID) 0.375 MG 12 hr tablet Take 1 tablet (0.375 mg total) by mouth 2 (two) times daily. (Patient taking differently: Take 0.375 mg by mouth as needed. )   hyoscyamine (LEVSIN SL) 0.125 MG SL tablet PLACE 1 TABLET UNDER THE TONGUE EVERY 4 HOURS AS NEEDED FOR CRAMPING AND NAUSEA   Multiple Vitamin (MULTIVITAMIN) capsule Take 1 capsule by mouth daily.   predniSONE (DELTASONE) 20 MG tablet Take 1 tablet (20 mg total) by mouth daily with breakfast. (Patient taking differently: Take 10 mg by mouth daily with breakfast. )   Probiotic Product (PROBIOTIC DAILY PO) Take by mouth daily.   simvastatin (ZOCOR) 40 MG tablet Take 1 tablet at Bedtime for Cholesterol   No current facility-administered medications on file prior to visit.     Allergies: No Active Allergies   Medical History:  Past Medical History:  Diagnosis Date   Allergy    Arthritis    BACK    Cancer ([place])    UTERINE    Cataract    MILD   GERD (gastroesophageal reflux disease)    Hyperlipidemia    Hypertension    Iritis    HLA B27 +   Prediabetes    S/P ORIF (open reduction internal fixation) fracture 1994   Right tib/fib   Thyroiditis, subacute    Vitamin D deficiency    Family history- Reviewed and unchanged Social history- Reviewed and unchanged   Review of Systems:  Review of Systems  Constitutional: Negative for malaise/fatigue and weight loss.  HENT: Negative for hearing loss and tinnitus.   Eyes: Negative for blurred vision and double vision.  Respiratory: Negative for cough, shortness of breath and wheezing.   Cardiovascular: Negative for chest pain, palpitations, orthopnea, claudication and leg swelling.  Gastrointestinal: Positive for diarrhea. Negative for abdominal pain, blood in stool, constipation, heartburn,  melena, nausea and vomiting.  Genitourinary: Negative.   Musculoskeletal: Negative for joint pain and myalgias.  Skin: Negative for rash.  Neurological: Negative for dizziness, tingling, sensory change, weakness and headaches.  Endo/Heme/Allergies: Positive for environmental allergies. Negative for polydipsia.  Psychiatric/Behavioral: Negative.   All other systems reviewed and are negative.     Physical Exam: BP (!) 142/72    Pulse 74    Temp (!) 97.3 F (36.3 C)    Wt 124 lb 9.6 oz (56.5 kg)    SpO2 97%    BMI 23.54 kg/m  Wt Readings from Last 3 Encounters:  08/14/19 124 lb 9.6 oz (56.5 kg)  07/18/19 123 lb (55.8 kg)  05/13/19 126 lb 12.8 oz (57.5 kg)   General Appearance: Well nourished, in no apparent distress. Eyes: PERRLA, EOMs, conjunctiva no swelling or erythema Sinuses: No Frontal/maxillary tenderness ENT/Mouth: Ext aud canals clear, TMs without erythema, bulging. No erythema, swelling, or exudate on post pharynx.  Tonsils not swollen or erythematous. Hearing normal.  Neck: Supple, thyroid normal.  Respiratory: Respiratory effort normal, BS equal bilaterally without rales, rhonchi, wheezing or stridor.  Cardio: RRR with no MRGs. Brisk peripheral pulses without edema.  Abdomen: Soft, + BS.  Non tender, no guarding, rebound, hernias, masses. Lymphatics: Non tender without lymphadenopathy.  Musculoskeletal: Full ROM, 5/5 strength, Normal gait Skin: Warm, dry without rashes, lesions, ecchymosis.  Neuro: Cranial nerves intact. No cerebellar symptoms.  Psych: Awake and oriented X 3, normal affect, Insight and Judgment appropriate.    Izora Ribas, NP 11:14 AM [facility]

## 2019-08-14 NOTE — Patient Instructions (Addendum)
Goals    . Blood Pressure < 130/80           High Triglycerides Eating Plan Triglycerides are a type of fat in the blood. High levels of triglycerides can increase your risk of heart disease and stroke. If your triglyceride levels are high, choosing the right foods can help lower your triglycerides and keep your heart healthy. Work with your health care provider or a diet and nutrition specialist (dietitian) to develop an eating plan that is right for you. What are tips for following this plan? General guidelines   Lose weight, if you are overweight. For most people, losing 5-10 lbs (2-5 kg) helps lower triglyceride levels. A weight-loss plan may include. ? 30 minutes of exercise at least 5 days a week. ? Reducing the amount of calories, sugar, and fat you eat.  Eat a wide variety of fresh fruits, vegetables, and whole grains. These foods are high in fiber.  Eat foods that contain healthy fats, such as fatty fish, nuts, seeds, and olive oil.  Avoid foods that are high in added sugar, added salt (sodium), saturated fat, and trans fat.  Avoid low-fiber, refined carbohydrates such as white bread, crackers, noodles, and white rice.  Avoid foods with partially hydrogenated oils (trans fats), such as fried foods or stick margarine.  Limit alcohol intake to no more than 1 drink a day for nonpregnant women and 2 drinks a day for men. One drink equals 12 oz of beer, 5 oz of wine, or 1 oz of hard liquor. Your health care provider may recommend that you drink less depending on your overall health. Reading food labels  Check food labels for the amount of saturated fat. Choose foods with no or very little saturated fat.  Check food labels for the amount of trans fat. Choose foods with no trans fat.  Check food labels for the amount of cholesterol. Choose foods low in cholesterol. Ask your dietitian how much cholesterol you should have each day.  Check food labels for the amount of sodium.  Choose foods with less than 140 milligrams (mg) per serving. Shopping  Buy dairy products labeled as nonfat (skim) or low-fat (1%).  Avoid buying processed or prepackaged foods. These are often high in added sugar, sodium, and fat. Cooking  Choose healthy fats when cooking, such as olive oil or canola oil.  Cook foods using lower fat methods, such as baking, broiling, boiling, or grilling.  Make your own sauces, dressings, and marinades when possible, instead of buying them. Store-bought sauces, dressings, and marinades are often high in sodium and sugar. Meal planning  Eat more home-cooked food and less restaurant, buffet, and fast food.  Eat fatty fish at least 2 times each week. Examples of fatty fish include salmon, trout, mackerel, tuna, and herring.  If you eat whole eggs, do not eat more than 3 egg yolks per week. What foods are recommended? The items listed may not be a complete list. Talk with your dietitian about what dietary choices are best for you. Grains Whole wheat or whole grain breads, crackers, cereals, and pasta. Unsweetened oatmeal. Bulgur. Barley. Quinoa. Brown rice. Whole wheat flour tortillas. Vegetables Fresh or frozen vegetables. Low-sodium canned vegetables. Fruits All fresh, canned (in natural juice), or frozen fruits. Meats and other protein foods Skinless chicken or [place]. Ground chicken or [place]. Lean cuts of pork, trimmed of fat. Fish and seafood, especially salmon, trout, and herring. Egg whites. Dried beans, peas, or lentils. Unsalted nuts or seeds.  Unsalted canned beans. Natural peanut or almond butter. Dairy Low-fat dairy products. Skim or low-fat (1%) milk. Reduced fat (2%) and low-sodium cheese. Low-fat ricotta cheese. Low-fat cottage cheese. Plain, low-fat yogurt. Fats and oils Tub margarine without trans fats. Light or reduced-fat mayonnaise. Light or reduced-fat salad dressings. Avocado. Safflower, olive, sunflower, soybean, and canola  oils. What foods are not recommended? The items listed may not be a complete list. Talk with your dietitian about what dietary choices are best for you. Grains White bread. White (regular) pasta. White rice. Cornbread. Bagels. Pastries. Crackers that contain trans fat. Vegetables Creamed or fried vegetables. Vegetables in a cheese sauce. Fruits Sweetened dried fruit. Canned fruit in syrup. Fruit juice. Meats and other protein foods Fatty cuts of meat. Ribs. Chicken wings. Berniece Salines. Sausage. Bologna. Salami. Chitterlings. Fatback. Hot dogs. Bratwurst. Packaged lunch meats. Dairy Whole or reduced-fat (2%) milk. Half-and-half. Cream cheese. Full-fat or sweetened yogurt. Full-fat cheese. Nondairy creamers. Whipped toppings. Processed cheese or cheese spreads. Cheese curds. Beverages Alcohol. Sweetened drinks, such as soda, lemonade, fruit drinks, or punches. Fats and oils Butter. Stick margarine. Lard. Shortening. Ghee. Bacon fat. Tropical oils, such as coconut, palm kernel, or palm oils. Sweets and desserts Corn syrup. Sugars. Honey. Molasses. Candy. Jam and jelly. Syrup. Sweetened cereals. Cookies. Pies. Cakes. Donuts. Muffins. Ice cream. Condiments Store-bought sauces, dressings, and marinades that are high in sugar, such as ketchup and barbecue sauce. Summary  High levels of triglycerides can increase the risk of heart disease and stroke. Choosing the right foods can help lower your triglycerides.  Eat plenty of fresh fruits, vegetables, and whole grains. Choose low-fat dairy and lean meats. Eat fatty fish at least twice a week.  Avoid processed and prepackaged foods with added sugar, sodium, saturated fat, and trans fat.  If you need suggestions or have questions about what types of food are good for you, talk with your health care provider or a dietitian. This information is not intended to replace advice given to you by your health care provider. Make sure you discuss any questions you  have with your health care provider. Document Revised: 12/15/2016 Document Reviewed: 03/07/2016 Elsevier Patient Education  2020 [employer].

## 2019-08-15 LAB — CBC WITH DIFFERENTIAL/PLATELET
Absolute Monocytes: 398 cells/uL (ref 200–950)
Basophils Absolute: 50 cells/uL (ref 0–200)
Basophils Relative: 0.6 %
Eosinophils Absolute: 33 cells/uL (ref 15–500)
Eosinophils Relative: 0.4 %
HCT: 39.8 % (ref 35.0–45.0)
Hemoglobin: 13.5 g/dL (ref 11.7–15.5)
Lymphs Abs: 1453 cells/uL (ref 850–3900)
MCH: 32 pg (ref 27.0–33.0)
MCHC: 33.9 g/dL (ref 32.0–36.0)
MCV: 94.3 fL (ref 80.0–100.0)
MPV: 10.2 fL (ref 7.5–12.5)
Monocytes Relative: 4.8 %
Neutro Abs: 6366 cells/uL (ref 1500–7800)
Neutrophils Relative %: 76.7 %
Platelets: 234 10*3/uL (ref 140–400)
RBC: 4.22 10*6/uL (ref 3.80–5.10)
RDW: 11.7 % (ref 11.0–15.0)
Total Lymphocyte: 17.5 %
WBC: 8.3 10*3/uL (ref 3.8–10.8)

## 2019-08-15 LAB — LIPID PANEL
Cholesterol: 161 mg/dL (ref <200)
HDL: 60 mg/dL (ref >=50)
LDL Cholesterol (Calc): 70 mg/dL (calc)
Non-HDL Cholesterol (Calc): 101 mg/dL (calc) (ref <130)
Total CHOL/HDL Ratio: 2.7 (calc) (ref <5.0)
Triglycerides: 221 mg/dL — ABNORMAL HIGH (ref <150)

## 2019-08-15 LAB — COMPLETE METABOLIC PANEL WITH GFR
AG Ratio: 2 (calc) (ref 1.0–2.5)
ALT: 12 U/L (ref 6–29)
AST: 17 U/L (ref 10–35)
Albumin: 4.5 g/dL (ref 3.6–5.1)
Alkaline phosphatase (APISO): 69 U/L (ref 37–153)
BUN: 16 mg/dL (ref 7–25)
CO2: 25 mmol/L (ref 20–32)
Calcium: 10.2 mg/dL (ref 8.6–10.4)
Chloride: 107 mmol/L (ref 98–110)
Creat: 0.68 mg/dL (ref 0.50–0.99)
GFR, Est African American: 108 mL/min/{1.73_m2} (ref >=60)
GFR, Est Non African American: 93 mL/min/{1.73_m2} (ref >=60)
Globulin: 2.2 g/dL (calc) (ref 1.9–3.7)
Glucose, Bld: 90 mg/dL (ref 65–99)
Potassium: 4.1 mmol/L (ref 3.5–5.3)
Sodium: 141 mmol/L (ref 135–146)
Total Bilirubin: 0.5 mg/dL (ref 0.2–1.2)
Total Protein: 6.7 g/dL (ref 6.1–8.1)

## 2019-08-15 LAB — MAGNESIUM: Magnesium: 2.4 mg/dL (ref 1.5–2.5)

## 2019-08-15 LAB — TSH: TSH: 0.84 mIU/L (ref 0.40–4.50)

## 2019-09-24 ENCOUNTER — Encounter: Payer: Self-pay | Admitting: Gastroenterology

## 2019-09-24 ENCOUNTER — Ambulatory Visit (INDEPENDENT_AMBULATORY_CARE_PROVIDER_SITE_OTHER): Payer: 59 | Admitting: Gastroenterology

## 2019-09-24 VITALS — BP 120/80 | HR 64 | Ht 61.0 in | Wt 124.2 lb

## 2019-09-24 DIAGNOSIS — K529 Noninfective gastroenteritis and colitis, unspecified: Secondary | ICD-10-CM | POA: Diagnosis not present

## 2019-09-24 NOTE — Patient Instructions (Signed)
If you are age 64 or older, your body mass index should be between 23-30. Your Body mass index is 23.47 kg/m. If this is out of the aforementioned range listed, please consider follow up with your Primary Care Provider.  If you are age 54 or younger, your body mass index should be between 19-25. Your Body mass index is 23.47 kg/m. If this is out of the aformentioned range listed, please consider follow up with your Primary Care Provider.   START Imodium OTC 1 tablet every morning   We will start working on patient assistance for your Entocort. Once we have forms filled out, we will contact you about what we need to do to get everything sent in.   You have been scheduled to follow up with Dr. [name] for November 19, 2019 at 9:10 am  Thank you for entrusting me with your care and choosing [facility].  Dr [name]

## 2019-09-24 NOTE — Progress Notes (Signed)
Review of pertinent gastrointestinal problems: 1.Routine risk for colon cancer.Colonoscopy April 2018 showedno polyps. Internal hemorrhoids were noted. She was recommended to have repeat colonoscopy at 10-year interval for colon cancer screening. Colonoscopy 2008was normal. 2.Chronic diarrhea led to colonoscopy November 2020.Terminal ileum was normal. There was mild erythema and granularity throughout the colon. Biopsies were taken. Pathology report stated lymphocytic colitis however endoscopically it was more suspicious formild ulcerative colitis.Budesonide was too expensive. Imodium caused cramping in nausea but was quite effective even at low doses. Tried to prescribe mesalamine January 2021 however ended up with sulfasalazine 1gram TIDdue toherinsurance restrictions     HPI: This is a very pleasant 64 year old woman who I last saw about 2 months ago  I last saw her about 2 months ago.  She was not doing very well from a diarrhea perspective.  There was a lot of back-and-forth with her insurance company about options for mesalamine, most of them were too expensive for her.  In the end I called in balsalazide 2.25 g p.o. 3 times daily as that is the one that her insurance company seem to help with the most.  This was not helping however.  She was still having 8-10 loose stools daily.  They were nonbloody.  We had a discussion about this.  She was never able to get budesonide because it was too expensive as well and so instead I put her on oral prednisone 20 mg once a day.  She was going to continue taking the balsalazide.  It seemed like this was helping and in fact 3 weeks after starting it she called to say that she was doing much better and so we started tapering her prednisone.  While on 20 mg of prednisone her bowels definitely improved however they were not back to normal. She was still going 6 or 8 times a day however a lot less urgency and the stools were becoming a bit  more solid. They were no longer watery, they were more like cow patty consistency. When she stopped taking 20 mg prednisone once daily down to 15 mg prednisone once daily the symptoms reverted back to where they were however. She has continued to take balsalazide 3 pills 3 times daily   ROS: complete GI ROS as described in HPI, all other review negative.  Constitutional:  No unintentional weight loss   Past Medical History:  Diagnosis Date  . Allergy   . Arthritis    BACK   . Cancer (HCC)    UTERINE   . Cataract    MILD  . GERD (gastroesophageal reflux disease)   . Hyperlipidemia   . Hypertension   . Iritis    HLA B27 +  . Prediabetes   . S/P ORIF (open reduction internal fixation) fracture 1994   Right tib/fib  . Thyroiditis, subacute   . Vitamin D deficiency     Past Surgical History:  Procedure Laterality Date  . CESAREAN SECTION    . COLONOSCOPY  03/17/2005   HEMS   . KNEE ARTHROSCOPY Left 1972  . right leg repair  1994   rod   . ROBOTIC ASSISTED TOTAL HYSTERECTOMY  2015   pt. states she had robot assisted hysterectomy and uterus was removed abdominally due to scar tissue.  she also states that both tubes and ovaries were removed.  . STAPEDECTOMY Left 2003  . TONSILLECTOMY AND ADENOIDECTOMY      Current Outpatient Medications  Medication Sig Dispense Refill  . aspirin EC 81 MG tablet  Take 81 mg by mouth daily.    . balsalazide (COLAZAL) 750 MG capsule Take 3 capsules (2,250 mg total) by mouth 3 (three) times daily. 270 capsule 3  . CALCIUM PO Take 600 mg by mouth daily.    . cetirizine (ZYRTEC) 10 MG tablet Take 10 mg by mouth daily.    . Cholecalciferol (VITAMIN D PO) Take 5,000 Int'l Units by mouth daily.    . Cyanocobalamin (VITAMIN B-12) 5000 MCG SUBL Place 5,000 mcg under the tongue daily.    . enalapril (VASOTEC) 20 MG tablet Take 1 tablet Daily for BP 90 tablet 3  . Flaxseed Oil (LINSEED OIL) OIL Take 1,200 mg by mouth 2 (two) times daily.     .  hyoscyamine (LEVBID) 0.375 MG 12 hr tablet Take 1 tablet (0.375 mg total) by mouth 2 (two) times daily. (Patient taking differently: Take 0.375 mg by mouth as needed. ) 60 tablet 3  . hyoscyamine (LEVSIN SL) 0.125 MG SL tablet PLACE 1 TABLET UNDER THE TONGUE EVERY 4 HOURS AS NEEDED FOR CRAMPING AND NAUSEA 90 tablet 0  . Multiple Vitamin (MULTIVITAMIN) capsule Take 1 capsule by mouth daily.    . Probiotic Product (PROBIOTIC DAILY PO) Take by mouth daily.    . simvastatin (ZOCOR) 40 MG tablet Take 1 tablet at Bedtime for Cholesterol 90 tablet 1   No current facility-administered medications for this visit.    Allergies as of 09/24/2019  . (No Known Allergies)    Family History  Problem Relation Age of Onset  . Cancer Mother        uterine, kidney  . Hypertension Mother   . Colon polyps Mother   . Stomach cancer Mother   . Diabetes Father   . Hypertension Father   . Stroke Father   . Cancer Father        kidney  . Colon cancer Maternal Grandmother   . Colon cancer Maternal Grandfather   . Esophageal cancer Neg Hx   . Rectal cancer Neg Hx     Social History   Socioeconomic History  . Marital status: Married    Spouse name: Not on file  . Number of children: 1  . Years of education: Not on file  . Highest education level: Not on file  Occupational History  . Not on file  Tobacco Use  . Smoking status: Never Smoker  . Smokeless tobacco: Never Used  Vaping Use  . Vaping Use: Never used  Substance and Sexual Activity  . Alcohol use: No  . Drug use: No  . Sexual activity: Not on file  Other Topics Concern  . Not on file  Social History Narrative  . Not on file   Social Determinants of Health   Financial Resource Strain:   . Difficulty of Paying Living Expenses: Not on file  Food Insecurity:   . Worried About Charity fundraiser in the Last Year: Not on file  . Ran Out of Food in the Last Year: Not on file  Transportation Needs:   . Lack of Transportation  (Medical): Not on file  . Lack of Transportation (Non-Medical): Not on file  Physical Activity:   . Days of Exercise per Week: Not on file  . Minutes of Exercise per Session: Not on file  Stress:   . Feeling of Stress : Not on file  Social Connections:   . Frequency of Communication with Friends and Family: Not on file  . Frequency of Social Gatherings with  Friends and Family: Not on file  . Attends Religious Services: Not on file  . Active Member of Clubs or Organizations: Not on file  . Attends Archivist Meetings: Not on file  . Marital Status: Not on file  Intimate Partner Violence:   . Fear of Current or Ex-Partner: Not on file  . Emotionally Abused: Not on file  . Physically Abused: Not on file  . Sexually Abused: Not on file     Physical Exam: BP 120/80   Pulse 64   Ht 5' 1"  (1.549 m)   Wt 124 lb 3.2 oz (56.3 kg)   BMI 23.47 kg/m  Constitutional: generally well-appearing Psychiatric: alert and oriented x3 Abdomen: soft, nontender, nondistended, no obvious ascites, no peritoneal signs, normal bowel sounds No peripheral edema noted in lower extremities  Assessment and plan: 64 y.o. 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 She had trouble sleeping with it even at 20 mg daily however. We are going to try again to get her budesonide orally, Entocort 3 mg pills, 3 pills once daily. There are patient assistance plans and they are also discount cards available. If this is not available that I will try uceris instead.  She will return to see me in 2 months and sooner if needed.  Please see the "Patient Instructions" section for addition details about the plan.  Owens Loffler, MD [facility] 09/24/2019, 9:17 AM   Total time on date of encounter was 30 minutes (this included time spent preparing to see the patient reviewing records; obtaining and/or reviewing separately obtained history;  performing a medically appropriate exam and/or evaluation; counseling and educating the patient and family if present; ordering medications, tests or procedures if applicable; and documenting clinical information in the health record).

## 2019-09-25 ENCOUNTER — Telehealth: Payer: Self-pay

## 2019-09-25 NOTE — Telephone Encounter (Signed)
Patient advised that I have submitted application online for Entocort patient assistance. Per website, a [place] representative will contact the patient within 2 business days to follow up.  Patient advised to be in touch with me once she has been contacted by [place] representative.  Patient agreed to plan and verbalized understanding.

## 2019-10-08 ENCOUNTER — Other Ambulatory Visit: Payer: Self-pay | Admitting: Gastroenterology

## 2019-10-08 NOTE — Telephone Encounter (Signed)
Pt called to inform that she has not heard back from pt assistance for Entocort yet. Pls call her.

## 2019-10-09 NOTE — Telephone Encounter (Signed)
Patient states that someone from [facility] did contact her but she did not answer the phone because she did not recognize the number.  She returned call to [place] and left a message for someone to return call.  Patient advised to return call to our office with any further questions or concerns. Patient agreed to plan and verbalized understanding. No further questions.

## 2019-10-09 NOTE — Telephone Encounter (Signed)
Spoke with [place] at [employer] and she states she will contact patient right away.  I will contact patient later this afternoon to see if she was contacted.

## 2019-10-10 NOTE — Telephone Encounter (Signed)
Patient states that she did speak with representative at [facility].  She was told they would only get medications from [place] or [place] and she would have to send a personal check with photo ID.  Patient does not feel comfortable doing such, so she is going to contact [facility] to see if she can use their coupon.  She will contact me nextgxt week

## 2019-10-14 ENCOUNTER — Other Ambulatory Visit: Payer: Self-pay | Admitting: Internal Medicine

## 2019-10-14 DIAGNOSIS — Z1231 Encounter for screening mammogram for malignant neoplasm of breast: Secondary | ICD-10-CM

## 2019-10-14 MED ORDER — BUDESONIDE 3 MG PO CPEP: 9.0000 mg | ORAL_CAPSULE | Freq: Every day | ORAL | 3 refills | Status: DC

## 2019-10-14 NOTE — Addendum Note (Signed)
Addended by: Stevan Born on: 10/14/2019 11:11 AM   Modules accepted: Orders

## 2019-10-14 NOTE — Telephone Encounter (Signed)
Returned call to patient and she would like an Rx for budesonide 86m take 3 capsules daily [facility]  She will call to our office if medication is too expensive to [place].  Patient agreed to plan and verbalized understanding.

## 2019-11-07 ENCOUNTER — Other Ambulatory Visit: Payer: Self-pay | Admitting: Gastroenterology

## 2019-11-10 ENCOUNTER — Other Ambulatory Visit: Payer: Self-pay

## 2019-11-10 ENCOUNTER — Ambulatory Visit: Payer: 59

## 2019-11-11 ENCOUNTER — Telehealth: Payer: Self-pay | Admitting: Gastroenterology

## 2019-11-11 MED ORDER — BALSALAZIDE DISODIUM 750 MG PO CAPS: 2250.0000 mg | ORAL_CAPSULE | Freq: Three times a day (TID) | ORAL | 0 refills | Status: DC

## 2019-11-11 NOTE — Telephone Encounter (Signed)
Refill has been sent to the pharmacy

## 2019-11-11 NOTE — Telephone Encounter (Signed)
Patient requesting refill on Balsazide

## 2019-11-13 ENCOUNTER — Ambulatory Visit
Admission: RE | Admit: 2019-11-13 | Discharge: 2019-11-13 | Disposition: A | Discharge status: Home / Self Care | Payer: 59 | Source: Ambulatory Visit | Attending: Internal Medicine | Admitting: Internal Medicine

## 2019-11-13 ENCOUNTER — Other Ambulatory Visit: Payer: Self-pay

## 2019-11-13 DIAGNOSIS — Z1231 Encounter for screening mammogram for malignant neoplasm of breast: Secondary | ICD-10-CM

## 2019-11-19 ENCOUNTER — Encounter: Payer: Self-pay | Admitting: Gastroenterology

## 2019-11-19 ENCOUNTER — Encounter: Payer: Self-pay | Admitting: Internal Medicine

## 2019-11-19 ENCOUNTER — Other Ambulatory Visit: Payer: Self-pay

## 2019-11-19 ENCOUNTER — Ambulatory Visit (INDEPENDENT_AMBULATORY_CARE_PROVIDER_SITE_OTHER): Payer: 59 | Admitting: Gastroenterology

## 2019-11-19 ENCOUNTER — Ambulatory Visit: Payer: 59 | Admitting: Internal Medicine

## 2019-11-19 VITALS — BP 146/78 | HR 76 | Ht 61.0 in | Wt 123.0 lb

## 2019-11-19 VITALS — BP 116/84 | HR 69 | Temp 97.0°F | Resp 6 | Ht 60.75 in | Wt 123.0 lb

## 2019-11-19 DIAGNOSIS — E782 Mixed hyperlipidemia: Secondary | ICD-10-CM

## 2019-11-19 DIAGNOSIS — R197 Diarrhea, unspecified: Secondary | ICD-10-CM

## 2019-11-19 DIAGNOSIS — R7309 Other abnormal glucose: Secondary | ICD-10-CM

## 2019-11-19 DIAGNOSIS — R0989 Other specified symptoms and signs involving the circulatory and respiratory systems: Secondary | ICD-10-CM | POA: Diagnosis not present

## 2019-11-19 DIAGNOSIS — E559 Vitamin D deficiency, unspecified: Secondary | ICD-10-CM | POA: Diagnosis not present

## 2019-11-19 DIAGNOSIS — K529 Noninfective gastroenteritis and colitis, unspecified: Secondary | ICD-10-CM

## 2019-11-19 DIAGNOSIS — K5190 Ulcerative colitis, unspecified, without complications: Secondary | ICD-10-CM

## 2019-11-19 DIAGNOSIS — Z79899 Other long term (current) drug therapy: Secondary | ICD-10-CM

## 2019-11-19 MED ORDER — BUDESONIDE 3 MG PO CPEP: ORAL_CAPSULE | ORAL | 3 refills | Status: DC

## 2019-11-19 NOTE — Patient Instructions (Signed)

## 2019-11-19 NOTE — Patient Instructions (Addendum)
If you are age 64 or older, your body mass index should be between 23-30. Your Body mass index is 23.24 kg/m. If this is out of the aforementioned range listed, please consider follow up with your Primary Care Provider.  If you are age 64 or younger, your body mass index should be between 19-25. Your Body mass index is 23.24 kg/m. If this is out of the aformentioned range listed, please consider follow up with your Primary Care Provider.  Please continue budesonide 56m take 3 capsules daily for 6 weeks.  Then decrease to budesonide 339mtake 2 capsules daily.  You will need a follow up appointment in January 2022.  Please call in early December to schedule this appointment.  Thank you for entrusting me with your care and [facility]

## 2019-11-19 NOTE — Progress Notes (Signed)
Review of pertinent gastrointestinal problems: 1.Routine risk for colon cancer.Colonoscopy April 2018 showedno polyps. Internal hemorrhoids were noted. She was recommended to have repeat colonoscopy at 10-year interval for colon cancer screening. Colonoscopy 2008was normal. 2.Chronic diarrhea led to colonoscopy November 2020.Terminal ileum was normal. There was mild erythema and granularity throughout the colon. Biopsies were taken. Pathology report stated lymphocytic colitis however endoscopically it was more suspicious formild ulcerative colitis.Budesonide was too expensive. Imodium caused cramping in nausea but was quite effective even at low doses. Tried to prescribe mesalamine January 2021 however ended up with sulfasalazine 1gram TIDdue toherinsurance restrictions.  Prednisone trial with good partial response.  Again we tried prescribing Entocort 3 mg pills 3 pills once daily.   HPI: This is a very pleasant 64 year old woman whom I last saw 2 months ago here in our office  We retried prescribing Entocort for her and this time it was successful.  I am not sure what the difference was with her insurance company but certainly we are happy about this.  She ended up starting Entocort 3 mg pills 3 pills once daily about 1 month ago.  Within 2 weeks her bowels were back to run around their usual.  She was no longer going 10-20 times daily and instead she was back to her 3-4 times a day usual baseline.  The stools are getting thicker as well.  She is quite happy about the response.  She is continued on balsalazide   ROS: complete GI ROS as described in HPI, all other review negative.  Constitutional:  No unintentional weight loss   Past Medical History:  Diagnosis Date  . Allergy   . Arthritis    BACK   . Cancer (HCC)    UTERINE   . Cataract    MILD  . GERD (gastroesophageal reflux disease)   . Hyperlipidemia   . Hypertension   . Iritis    HLA B27 +  .  Prediabetes   . S/P ORIF (open reduction internal fixation) fracture 1994   Right tib/fib  . Thyroiditis, subacute   . Vitamin D deficiency     Past Surgical History:  Procedure Laterality Date  . CESAREAN SECTION    . COLONOSCOPY  03/17/2005   HEMS   . KNEE ARTHROSCOPY Left 1972  . right leg repair  1994   rod   . ROBOTIC ASSISTED TOTAL HYSTERECTOMY  2015   pt. states she had robot assisted hysterectomy and uterus was removed abdominally due to scar tissue.  she also states that both tubes and ovaries were removed.  . STAPEDECTOMY Left 2003  . TONSILLECTOMY AND ADENOIDECTOMY      Current Outpatient Medications  Medication Sig Dispense Refill  . aspirin EC 81 MG tablet Take 81 mg by mouth daily.    . balsalazide (COLAZAL) 750 MG capsule Take 3 capsules (2,250 mg total) by mouth 3 (three) times daily. 270 capsule 0  . budesonide (ENTOCORT EC) 3 MG 24 hr capsule Take 3 capsules (9 mg total) by mouth daily. 90 capsule 3  . CALCIUM PO Take 600 mg by mouth daily.    . cetirizine (ZYRTEC) 10 MG tablet Take 10 mg by mouth daily.    . Cholecalciferol (VITAMIN D PO) Take 5,000 Int'l Units by mouth daily.    . Cyanocobalamin (VITAMIN B-12) 5000 MCG SUBL Place 5,000 mcg under the tongue daily.    . enalapril (VASOTEC) 20 MG tablet Take 1 tablet Daily for BP 90 tablet 3  . Flaxseed  Oil (LINSEED OIL) OIL Take 1,200 mg by mouth 2 (two) times daily.     . hyoscyamine (LEVSIN SL) 0.125 MG SL tablet PLACE 1 TABLET UNDER THE TONGUE EVERY 4 HOURS AS NEEDED FOR CRAMPING AND NAUSEA 90 tablet 0  . Multiple Vitamin (MULTIVITAMIN) capsule Take 1 capsule by mouth daily.    . Probiotic Product (PROBIOTIC DAILY PO) Take by mouth daily.    . simvastatin (ZOCOR) 40 MG tablet Take 1 tablet at Bedtime for Cholesterol 90 tablet 1   No current facility-administered medications for this visit.    Allergies as of 11/19/2019  . (No Known Allergies)    Family History  Problem Relation Age of Onset  . Cancer  Mother        uterine, kidney  . Hypertension Mother   . Colon polyps Mother   . Stomach cancer Mother   . Diabetes Father   . Hypertension Father   . Stroke Father   . Cancer Father        kidney  . Colon cancer Maternal Grandmother   . Colon cancer Maternal Grandfather   . Breast cancer Paternal Aunt   . Esophageal cancer Neg Hx   . Rectal cancer Neg Hx     Social History   Socioeconomic History  . Marital status: Married    Spouse name: Not on file  . Number of children: 1  . Years of education: Not on file  . Highest education level: Not on file  Occupational History  . Not on file  Tobacco Use  . Smoking status: Never Smoker  . Smokeless tobacco: Never Used  Vaping Use  . Vaping Use: Never used  Substance and Sexual Activity  . Alcohol use: No  . Drug use: No  . Sexual activity: Yes  Other Topics Concern  . Not on file  Social History Narrative  . Not on file   Social Determinants of Health   Financial Resource Strain:   . Difficulty of Paying Living Expenses: Not on file  Food Insecurity:   . Worried About Charity fundraiser in the Last Year: Not on file  . Ran Out of Food in the Last Year: Not on file  Transportation Needs:   . Lack of Transportation (Medical): Not on file  . Lack of Transportation (Non-Medical): Not on file  Physical Activity:   . Days of Exercise per Week: Not on file  . Minutes of Exercise per Session: Not on file  Stress:   . Feeling of Stress : Not on file  Social Connections:   . Frequency of Communication with Friends and Family: Not on file  . Frequency of Social Gatherings with Friends and Family: Not on file  . Attends Religious Services: Not on file  . Active Member of Clubs or Organizations: Not on file  . Attends Archivist Meetings: Not on file  . Marital Status: Not on file  Intimate Partner Violence:   . Fear of Current or Ex-Partner: Not on file  . Emotionally Abused: Not on file  . Physically  Abused: Not on file  . Sexually Abused: Not on file     Physical Exam: BP (!) 146/78   Pulse 76   Ht 5' 1"  (1.549 m)   Wt 123 lb (55.8 kg)   BMI 23.24 kg/m  Constitutional: generally well-appearing Psychiatric: alert and oriented x3 Abdomen: soft, nontender, nondistended, no obvious ascites, no peritoneal signs, normal bowel sounds No peripheral edema noted in lower  extremities  Assessment and plan: 64 y.o. 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  She will continue on this regimen for another 6 weeks and then she will back down to 6 mg of Entocort daily.  She will continue her balsalazide as she has been doing.  She will return to see me in 2 months and sooner if needed  Please see the "Patient Instructions" section for addition details about the plan.  Owens Loffler, MD [facility] 11/19/2019, 9:03 AM   Total time on date of encounter was 25 minutes (this included time spent preparing to see the patient reviewing records; obtaining and/or reviewing separately obtained history; performing a medically appropriate exam and/or evaluation; counseling and educating the patient and family if present; ordering medications, tests or procedures if applicable; and documenting clinical information in the health record).

## 2019-11-19 NOTE — Progress Notes (Signed)
History of Present Illness:       This very nice 64 y.o.  MWF presents for  6  month follow up with HTN, HLD, Pre-Diabetes and Vitamin D Deficiency. Patient follows closely with Dr [name] for her Lymphocytic /Ulcerative Colitis.      Patient is treated for HTN (2010) & BP has been controlled at home. Today's BP is at goal -  116/84. Patient has had no complaints of any cardiac type chest pain, palpitations, dyspnea / orthopnea / PND, dizziness, claudication, or dependent edema.      Hyperlipidemia is controlled with diet & Simvastatin. Patient denies myalgias or other med SE's. Last Lipids were at goal except elevated Trig's:  Lab Results  Component Value Date   CHOL 161 08/14/2019   HDL 60 08/14/2019   LDLCALC 70 08/14/2019   TRIG 221 (H) 08/14/2019   CHOLHDL 2.7 08/14/2019    Also, the patient has history of PreDiabetes (A1c 5.8% /2014) and has had no symptoms of reactive hypoglycemia, diabetic polys, paresthesias or visual blurring.  Last A1c was Normal & at goal:  Lab Results  Component Value Date   HGBA1C 5.2 05/13/2019           Further, the patient also has history of Vitamin D Deficiency and supplements vitamin D without any suspected side-effects. Last vitamin D was at goal:   Lab Results  Component Value Date   VD25OH 29 05/13/2019    Current Outpatient Medications on File Prior to Visit  Medication Sig  . aspirin EC 81 MG tablet Take  daily.  . balsalazide (COLAZAL) 750 MG caps Take 3 capsules 3  times daily.  . budesonide (ENTOCORT EC) 3 MG  Take 3 capsules  daily.  Marland Kitchen CALCIUM 600 mg Take   daily.  . cetirizine  10 MG tablet Take  daily.  Marland Kitchen VITAMIN D  Take 5,000 Units  daily.  Marland Kitchen VIT B-12 5000 MCG SL Place 5,000 mcg under  tongue daily.  . enalapril  20 MG tablet Take 1 tablet Daily f  . Flaxseed Oil  Take 1,200 mg  2 times daily.   . hyoscyamine (LEVSIN SL) 0.125 MG SL tablet PLACE 1 TABLET UNDER THE TONGUE EVERY 4 HOURS AS NEEDED FOR CRAMPING AND  NAUSEA  . MultiVit  Take 1 capsule  daily.  . Probiotic  Take  daily.  . simvastatin  40 MG tablet Take 1 tablet at Bedtime     No Known Allergies  PMHx:   Past Medical History:  Diagnosis Date  . Allergy   . Arthritis    BACK   . Cancer (HCC)    UTERINE   . Cataract    MILD  . GERD (gastroesophageal reflux disease)   . Hyperlipidemia   . Hypertension   . Iritis    HLA B27 +  . Prediabetes   . S/P ORIF (open reduction internal fixation) fracture 1994   Right tib/fib  . Thyroiditis, subacute   . Vitamin D deficiency     Immunization History  Administered Date(s) Administered  . Influenza Inj Mdck Quad With Preservative 12/03/2017, 11/13/2018  . Janssen (J&J) SARS-COV-2 Vaccination 04/23/2019  . PPD Test 09/30/2013, 05/13/2019  . Pneumococcal-Unspecified 08/27/2008  . Tdap 07/18/2007  . Zoster 12/29/2015    Past Surgical History:  Procedure Laterality Date  . CESAREAN SECTION    . COLONOSCOPY  03/17/2005   HEMS   . KNEE ARTHROSCOPY Left 1972  . right leg  repair  1994   rod   . ROBOTIC ASSISTED TOTAL HYSTERECTOMY  2015   pt. states she had robot assisted hysterectomy and uterus was removed abdominally due to scar tissue.  she also states that both tubes and ovaries were removed.  . STAPEDECTOMY Left 2003  . TONSILLECTOMY AND ADENOIDECTOMY      FHx:    Reviewed / unchanged  SHx:    Reviewed / unchanged   Systems Review:  Constitutional: Denies fever, chills, wt changes, headaches, insomnia, fatigue, night sweats, change in appetite. Eyes: Denies redness, blurred vision, diplopia, discharge, itchy, watery eyes.  ENT: Denies discharge, congestion, post nasal drip, epistaxis, sore throat, earache, hearing loss, dental pain, tinnitus, vertigo, sinus pain, snoring.  CV: Denies chest pain, palpitations, irregular heartbeat, syncope, dyspnea, diaphoresis, orthopnea, PND, claudication or edema. Respiratory: denies cough, dyspnea, DOE, pleurisy, hoarseness,  laryngitis, wheezing.  Gastrointestinal: Denies dysphagia, odynophagia, heartburn, reflux, water brash, abdominal pain or cramps, nausea, vomiting, bloating, diarrhea, constipation, hematemesis, melena, hematochezia  or hemorrhoids. Genitourinary: Denies dysuria, frequency, urgency, nocturia, hesitancy, discharge, hematuria or flank pain. Musculoskeletal: Denies arthralgias, myalgias, stiffness, jt. swelling, pain, limping or strain/sprain.  Skin: Denies pruritus, rash, hives, warts, acne, eczema or change in skin lesion(s). Neuro: No weakness, tremor, incoordination, spasms, paresthesia or pain. Psychiatric: Denies confusion, memory loss or sensory loss. Endo: Denies change in weight, skin or hair change.  Heme/Lymph: No excessive bleeding, bruising or enlarged lymph nodes.  Physical Exam  BP 116/84   Pulse 69   Temp (!) 97 F (36.1 C)   Resp (!) 6   Ht 5' 0.75" (1.543 m)   Wt 123 lb (55.8 kg)   SpO2 98%   BMI 23.43 kg/m   Appears  well nourished, well groomed  and in no distress.  Eyes: PERRLA, EOMs, conjunctiva no swelling or erythema. Sinuses: No frontal/maxillary tenderness ENT/Mouth: EAC's clear, TM's nl w/o erythema, bulging. Nares clear w/o erythema, swelling, exudates. Oropharynx clear without erythema or exudates. Oral hygiene is good. Tongue normal, non obstructing. Hearing intact.  Neck: Supple. Thyroid not palpable. Car 2+/2+ without bruits, nodes or JVD. Chest: Respirations nl with BS clear & equal w/o rales, rhonchi, wheezing or stridor.  Cor: Heart sounds normal w/ regular rate and rhythm without sig. murmurs, gallops, clicks or rubs. Peripheral pulses normal and equal  without edema.  Abdomen: Soft & bowel sounds normal. Non-tender w/o guarding, rebound, hernias, masses or organomegaly.  Lymphatics: Unremarkable.  Musculoskeletal: Full ROM all peripheral extremities, joint stability, 5/5 strength and normal gait.  Skin: Warm, dry without exposed rashes, lesions or  ecchymosis apparent.  Neuro: Cranial nerves intact, reflexes equal bilaterally. Sensory-motor testing grossly intact. Tendon reflexes grossly intact.  Pysch: Alert & oriented x 3.  Insight and judgement nl & appropriate. No ideations.  Assessment and Plan:  1. Labile hypertension  - Continue medication, monitor blood pressure at home.  - Continue DASH diet.  Reminder to go to the ER if any CP,  SOB, nausea, dizziness, severe HA, changes vision/speech.  - CBC with Differential/Platelet - COMPLETE METABOLIC PANEL WITH GFR - Magnesium - TSH  2. Hyperlipidemia, mixed  - Continue diet/meds, exercise,& lifestyle modifications.  - Continue monitor periodic cholesterol/liver & renal functions   - Lipid panel - TSH  3. Abnormal glucose  - Continue diet, exercise  - Lifestyle modifications.  - Monitor appropriate labs.  - Hemoglobin A1c - Insulin, random  4. Vitamin D deficiency  - Continue supplementation.  - VITAMIN D 25 Hydroxy  5. Ulcerative colitis  ([place])  - CBC with Differential/Platelet - COMPLETE METABOLIC PANEL WITH GFR  6. Medication management  - CBC with Differential/Platelet - COMPLETE METABOLIC PANEL WITH GFR - Magnesium - Lipid panel - TSH - Hemoglobin A1c - Insulin, random - VITAMIN D 25 Hydroxy         Discussed  regular exercise, BP monitoring, weight control to achieve/maintain BMI less than 25 and discussed med and SE's. Recommended labs to assess and monitor clinical status with further disposition pending results of labs.  I discussed the assessment and treatment plan with the patient. The patient was provided an opportunity to ask questions and all were answered. The patient agreed with the plan and demonstrated an understanding of the instructions.  I provided over 30 minutes of exam, counseling, chart review and  complex critical decision making.         The patient was advised to call back or seek an in-person evaluation if the symptoms  worsen or if the condition fails to improve as anticipated.   Kirtland Bouchard, MD

## 2019-11-20 LAB — LIPID PANEL
Cholesterol: 143 mg/dL (ref <200)
HDL: 44 mg/dL — ABNORMAL LOW (ref >=50)
LDL Cholesterol (Calc): 76 mg/dL (calc)
Non-HDL Cholesterol (Calc): 99 mg/dL (calc) (ref <130)
Total CHOL/HDL Ratio: 3.3 (calc) (ref <5.0)
Triglycerides: 156 mg/dL — ABNORMAL HIGH (ref <150)

## 2019-11-20 LAB — COMPLETE METABOLIC PANEL WITH GFR
AG Ratio: 2.3 (calc) (ref 1.0–2.5)
ALT: 8 U/L (ref 6–29)
AST: 14 U/L (ref 10–35)
Albumin: 4.4 g/dL (ref 3.6–5.1)
Alkaline phosphatase (APISO): 78 U/L (ref 37–153)
BUN: 16 mg/dL (ref 7–25)
CO2: 26 mmol/L (ref 20–32)
Calcium: 9.8 mg/dL (ref 8.6–10.4)
Chloride: 108 mmol/L (ref 98–110)
Creat: 0.67 mg/dL (ref 0.50–0.99)
GFR, Est African American: 108 mL/min/{1.73_m2} (ref >=60)
GFR, Est Non African American: 93 mL/min/{1.73_m2} (ref >=60)
Globulin: 1.9 g/dL (calc) (ref 1.9–3.7)
Glucose, Bld: 83 mg/dL (ref 65–99)
Potassium: 3.6 mmol/L (ref 3.5–5.3)
Sodium: 143 mmol/L (ref 135–146)
Total Bilirubin: 0.5 mg/dL (ref 0.2–1.2)
Total Protein: 6.3 g/dL (ref 6.1–8.1)

## 2019-11-20 LAB — CBC WITH DIFFERENTIAL/PLATELET
Absolute Monocytes: 540 cells/uL (ref 200–950)
Basophils Absolute: 42 cells/uL (ref 0–200)
Basophils Relative: 0.7 %
Eosinophils Absolute: 72 cells/uL (ref 15–500)
Eosinophils Relative: 1.2 %
HCT: 39.2 % (ref 35.0–45.0)
Hemoglobin: 13 g/dL (ref 11.7–15.5)
Lymphs Abs: 2094 cells/uL (ref 850–3900)
MCH: 30.6 pg (ref 27.0–33.0)
MCHC: 33.2 g/dL (ref 32.0–36.0)
MCV: 92.2 fL (ref 80.0–100.0)
MPV: 10.2 fL (ref 7.5–12.5)
Monocytes Relative: 9 %
Neutro Abs: 3252 cells/uL (ref 1500–7800)
Neutrophils Relative %: 54.2 %
Platelets: 234 10*3/uL (ref 140–400)
RBC: 4.25 10*6/uL (ref 3.80–5.10)
RDW: 12 % (ref 11.0–15.0)
Total Lymphocyte: 34.9 %
WBC: 6 10*3/uL (ref 3.8–10.8)

## 2019-11-20 LAB — HEMOGLOBIN A1C
Hgb A1c MFr Bld: 5.4 % of total Hgb (ref <5.7)
Mean Plasma Glucose: 108 (calc)
eAG (mmol/L): 6 (calc)

## 2019-11-20 LAB — INSULIN, RANDOM: Insulin: 8.9 u[IU]/mL

## 2019-11-20 LAB — VITAMIN D 25 HYDROXY (VIT D DEFICIENCY, FRACTURES): Vit D, 25-Hydroxy: 80 ng/mL (ref 30–100)

## 2019-11-20 LAB — MAGNESIUM: Magnesium: 2.3 mg/dL (ref 1.5–2.5)

## 2019-11-20 LAB — TSH: TSH: 1.31 mIU/L (ref 0.40–4.50)

## 2019-11-20 NOTE — Progress Notes (Signed)
========================================================== ==========================================================  -    Total Chol = 143 and LDL Chol = 76 - Both  Excellent   - Very low risk for Heart Attack  / Stroke =============================================================  - But Triglycerides (   156   ) or fats in blood are slightly elevated   - Recommend avoid fried & greasy foods,  sweets / candy,   - Avoid white rice  (brown or wild rice or Quinoa is OK),   - Avoid white potatoes  (sweet potatoes are OK)   - Avoid anything made from white flour  - bagels, doughnuts, rolls, buns, biscuits, white and   wheat breads, pizza crust and traditional  pasta made of white flour & egg white  - (vegetarian pasta or spinach or wheat pasta is OK).    - Multi-grain bread is OK - like multi-grain flat bread or  sandwich thins.   - Avoid alcohol in excess.   - Exercise is also important. ==========================================================  -  A1c - Normal - Great - No Diabetes ! ==========================================================  -  Vitamin D = 80 - Excellent  ==========================================================  -  All Else - CBC - Kidneys - Electrolytes - Liver - Magnesium & Thyroid    - all  Normal / OK ====================================================   - Keep up the [place] Work ! ==========================================================

## 2019-11-25 ENCOUNTER — Ambulatory Visit: Payer: 59 | Admitting: Gastroenterology

## 2019-12-08 ENCOUNTER — Other Ambulatory Visit: Payer: Self-pay | Admitting: Internal Medicine

## 2019-12-10 ENCOUNTER — Other Ambulatory Visit: Payer: Self-pay | Admitting: Gastroenterology

## 2019-12-30 ENCOUNTER — Other Ambulatory Visit: Payer: Self-pay | Admitting: Internal Medicine

## 2020-01-12 ENCOUNTER — Other Ambulatory Visit: Payer: Self-pay | Admitting: Gastroenterology

## 2020-01-27 ENCOUNTER — Other Ambulatory Visit: Payer: Self-pay | Admitting: Internal Medicine

## 2020-01-27 DIAGNOSIS — R109 Unspecified abdominal pain: Secondary | ICD-10-CM

## 2020-01-27 DIAGNOSIS — R197 Diarrhea, unspecified: Secondary | ICD-10-CM

## 2020-02-09 ENCOUNTER — Encounter: Payer: Self-pay | Admitting: Gastroenterology

## 2020-02-09 ENCOUNTER — Ambulatory Visit (INDEPENDENT_AMBULATORY_CARE_PROVIDER_SITE_OTHER): Payer: 59 | Admitting: Gastroenterology

## 2020-02-09 VITALS — BP 146/78 | HR 72 | Ht 61.0 in | Wt 129.0 lb

## 2020-02-09 DIAGNOSIS — K529 Noninfective gastroenteritis and colitis, unspecified: Secondary | ICD-10-CM

## 2020-02-09 MED ORDER — BUDESONIDE 3 MG PO CPEP: 3.0000 mg | ORAL_CAPSULE | Freq: Every day | ORAL | 0 refills | Status: DC

## 2020-02-09 NOTE — Progress Notes (Signed)
Review of pertinent gastrointestinal problems: 1.Routine risk for colon cancer.Colonoscopy April 2018 showedno polyps. Internal hemorrhoids were noted. She was recommended to have repeat colonoscopy at 10-year interval for colon cancer screening. Colonoscopy 2008was normal. 2.Chronic diarrhea led to colonoscopy November 2020.Terminal ileum was normal. There was mild erythema and granularity throughout the colon. Biopsies were taken. Pathology report stated lymphocytic colitis however endoscopically it was more suspicious formild ulcerative colitis.Budesonide was too expensive. Imodium caused cramping in nausea but was quite effective even at low doses. Tried to prescribe mesalamine January 2021 however ended up with sulfasalazine 1gram TIDdue toherinsurance restrictions.  Prednisone trial with good partial response.  Again we tried prescribing Entocort 3 mg pills 3 pills once daily.  Very good response as of November 2021   HPI: This is a very pleasant 64 year old woman  I last saw her about 2-1/2 months ago.  She was doing quite well on Entocort 3 mg pills 3 pills once daily.  I recommended she continue on 9 mg a day for another 6 weeks and then decrease to 6 mg a day.  She was to continue her balsalazide at its regular dose.  Her weight is up 6 pounds since her last office visit here about 2 and half months ago.  She is pretty content with her bowels currently.  She is taking 2 Entocort once daily and full-strength balsalazide 3 pills 3 times daily.  On this she has 3-4 relatively loose stools every day.  This is really about normal for her.  She does not see blood in her stool.  She has no abdominal pains.   ROS: complete GI ROS as described in HPI, all other review negative.  Constitutional:  No unintentional weight loss   Past Medical History:  Diagnosis Date  . Allergy   . Arthritis    BACK   . Cancer (HCC)    UTERINE   . Cataract    MILD  . GERD  (gastroesophageal reflux disease)   . Hyperlipidemia   . Hypertension   . Iritis    HLA B27 +  . Prediabetes   . S/P ORIF (open reduction internal fixation) fracture 1994   Right tib/fib  . Thyroiditis, subacute   . Vitamin D deficiency     Past Surgical History:  Procedure Laterality Date  . CESAREAN SECTION    . COLONOSCOPY  03/17/2005   HEMS   . KNEE ARTHROSCOPY Left 1972  . right leg repair  1994   rod   . ROBOTIC ASSISTED TOTAL HYSTERECTOMY  2015   pt. states she had robot assisted hysterectomy and uterus was removed abdominally due to scar tissue.  she also states that both tubes and ovaries were removed.  . STAPEDECTOMY Left 2003  . TONSILLECTOMY AND ADENOIDECTOMY      Current Outpatient Medications  Medication Sig Dispense Refill  . aspirin EC 81 MG tablet Take 81 mg by mouth daily.    . balsalazide (COLAZAL) 750 MG capsule TAKE 3 CAPSULES BY MOUTH THREE TIMES DAILY 270 capsule 0  . budesonide (ENTOCORT EC) 3 MG 24 hr capsule Take 3 capsules by mouth daily for 6 weeks then reduce to 2 capsules daily. (Patient taking differently: Take 6 mg by mouth daily.) 90 capsule 3  . CALCIUM PO Take 600 mg by mouth daily.    . cetirizine (ZYRTEC) 10 MG tablet Take 10 mg by mouth daily.    . Cholecalciferol (VITAMIN D PO) Take 5,000 Int'l Units by mouth daily.    Marland Kitchen  Cyanocobalamin (VITAMIN B-12) 5000 MCG SUBL Place 5,000 mcg under the tongue daily.    . enalapril (VASOTEC) 20 MG tablet Take 1 tablet by mouth once daily for blood pressure 90 tablet 0  . Flaxseed Oil (LINSEED OIL) OIL Take 1,200 mg by mouth 2 (two) times daily.     . hyoscyamine (LEVSIN SL) 0.125 MG SL tablet DISSOLVE 1 TABLET IN MOUTH EVERY 4 HOURS AS NEEDED FOR CRAMPS AND FOR NAUSEA 90 tablet 0  . Multiple Vitamin (MULTIVITAMIN) capsule Take 1 capsule by mouth daily.    . Probiotic Product (PROBIOTIC DAILY PO) Take by mouth daily.    . simvastatin (ZOCOR) 40 MG tablet Take      1 tablet       at Bedtime       for  Cholesterol 90 tablet 0   No current facility-administered medications for this visit.    Allergies as of 02/09/2020  . (No Known Allergies)    Family History  Problem Relation Age of Onset  . Cancer Mother        uterine, kidney  . Hypertension Mother   . Colon polyps Mother   . Stomach cancer Mother   . Diabetes Father   . Hypertension Father   . Stroke Father   . Cancer Father        kidney  . Colon cancer Maternal Grandmother   . Colon cancer Maternal Grandfather   . Breast cancer Paternal Aunt   . Esophageal cancer Neg Hx   . Rectal cancer Neg Hx     Social History   Socioeconomic History  . Marital status: Married    Spouse name: Not on file  . Number of children: 1  . Years of education: Not on file  . Highest education level: Not on file  Occupational History  . Not on file  Tobacco Use  . Smoking status: Never Smoker  . Smokeless tobacco: Never Used  Vaping Use  . Vaping Use: Never used  Substance and Sexual Activity  . Alcohol use: No  . Drug use: No  . Sexual activity: Yes  Other Topics Concern  . Not on file  Social History Narrative  . Not on file   Social Determinants of Health   Financial Resource Strain: Not on file  Food Insecurity: Not on file  Transportation Needs: Not on file  Physical Activity: Not on file  Stress: Not on file  Social Connections: Not on file  Intimate Partner Violence: Not on file     Physical Exam: BP (!) 146/78   Pulse 72   Ht 5' 1"  (1.549 m)   Wt 129 lb (58.5 kg)   BMI 24.37 kg/m  Constitutional: generally well-appearing Psychiatric: alert and oriented x3 Abdomen: soft, nontender, nondistended, no obvious ascites, no peritoneal signs, normal bowel sounds No peripheral edema noted in lower extremities  Assessment and plan: 65 y.o. female with mild ulcerative colitis  Her symptoms seem to be getting under control on balsalazide full-strength 3 pills 3 times daily as well as budesonide orally.  She  is going to cut back on her budesonide to 3 mg once daily.  I asked that she start taking a single Imodium 1 pill once daily and that she return to see me in 2 months.  I see no reason for any further blood tests or imaging studies at this point.  Please see the "Patient Instructions" section for addition details about the plan.  Owens Loffler, MD [facility]  02/09/2020, 3:39 PM   Total time on date of encounter was 20 minutes (this included time spent preparing to see the patient reviewing records; obtaining and/or reviewing separately obtained history; performing a medically appropriate exam and/or evaluation; counseling and educating the patient and family if present; ordering medications, tests or procedures if applicable; and documenting clinical information in the health record).

## 2020-02-09 NOTE — Patient Instructions (Addendum)
If you are age 65 or younger, your body mass index should be between 19-25. Your Body mass index is 24.37 kg/m. If this is out of the aformentioned range listed, please consider follow up with your Primary Care Provider.   DECREASE: entocort 26m once daily  START: Imodium once daily with breakfast.  CONTINUE: balsalzide 7538m3 pills three times daily.  You are scheduled to follow up on 04-13-2020 at 10:50am.  Thank you for entrusting me with your care and [facility]

## 2020-02-10 ENCOUNTER — Other Ambulatory Visit: Payer: Self-pay | Admitting: Gastroenterology

## 2020-02-11 ENCOUNTER — Encounter: Payer: Self-pay | Admitting: Internal Medicine

## 2020-02-19 ENCOUNTER — Ambulatory Visit: Payer: 59 | Admitting: Adult Health Nurse Practitioner

## 2020-03-09 ENCOUNTER — Other Ambulatory Visit: Payer: Self-pay | Admitting: Adult Health

## 2020-04-05 LAB — HM DIABETES EYE EXAM

## 2020-04-13 ENCOUNTER — Encounter: Payer: Self-pay | Admitting: Gastroenterology

## 2020-04-13 ENCOUNTER — Ambulatory Visit (INDEPENDENT_AMBULATORY_CARE_PROVIDER_SITE_OTHER): Payer: 59 | Admitting: Gastroenterology

## 2020-04-13 VITALS — BP 130/70 | HR 80 | Ht 61.0 in | Wt 130.0 lb

## 2020-04-13 DIAGNOSIS — K51919 Ulcerative colitis, unspecified with unspecified complications: Secondary | ICD-10-CM | POA: Diagnosis not present

## 2020-04-13 NOTE — Progress Notes (Signed)
Review of pertinent gastrointestinal problems: 1.Routine risk for colon cancer.Colonoscopy April 2018 showedno polyps. Internal hemorrhoids were noted. She was recommended to have repeat colonoscopy at 10-year interval for colon cancer screening. Colonoscopy 2008was normal. 2.Chronic diarrhea led to colonoscopy November 2020.Terminal ileum was normal. There was mild erythema and granularity throughout the colon. Biopsies were taken. Pathology report stated lymphocytic colitis however endoscopically it was more suspicious formild ulcerative colitis.Budesonide was too expensive. Imodium caused cramping in nausea but was quite effective even at low doses. Tried to prescribe mesalamine January 2021 however ended up with sulfasalazine 1gram TIDdue toherinsurance restrictions.Prednisone trial with good partial response. Again we tried prescribing Entocort 3 mg pills 3 pills once daily.  Very good response as of November 2021    HPI: This is a very pleasant 65 year old woman  I last saw her 3 months ago.  She was taking 2 Entocort tabs once daily as well as full-strength balsalazide 3 pills 3 times daily.  On that she was having 3-4 relatively loose stools every day.  She told me that was really about normal for her.  I recommended she cut back her Entocort to 3 mg 1 pill once daily, continue the balsalazide at current dose and start taking a single Imodium 1 pill once daily.  She made those changes and has been doing overall quite well.  She is pretty happy with her bowels right now.  She has 1-2 solid bowel movements daily without any bleeding.  2 or 3 days out of the month she will have loose stools that are nonbloody with some abdominal swelling.  Those are bad days.  The vast majority of days are good days for now.   ROS: complete GI ROS as described in HPI, all other review negative.  Constitutional:  No unintentional weight loss   Past Medical History:  Diagnosis Date   . Allergy   . Arthritis    BACK   . Cancer (HCC)    UTERINE   . Cataract    MILD  . GERD (gastroesophageal reflux disease)   . Hyperlipidemia   . Hypertension   . Iritis    HLA B27 +  . Prediabetes   . S/P ORIF (open reduction internal fixation) fracture 1994   Right tib/fib  . Thyroiditis, subacute   . Vitamin D deficiency     Past Surgical History:  Procedure Laterality Date  . CESAREAN SECTION    . COLONOSCOPY  03/17/2005   HEMS   . KNEE ARTHROSCOPY Left 1972  . right leg repair  1994   rod   . ROBOTIC ASSISTED TOTAL HYSTERECTOMY  2015   pt. states she had robot assisted hysterectomy and uterus was removed abdominally due to scar tissue.  she also states that both tubes and ovaries were removed.  . STAPEDECTOMY Left 2003  . TONSILLECTOMY AND ADENOIDECTOMY      Current Outpatient Medications  Medication Sig Dispense Refill  . aspirin EC 81 MG tablet Take 81 mg by mouth daily.    . balsalazide (COLAZAL) 750 MG capsule TAKE 3 CAPSULES BY MOUTH THREE TIMES DAILY 270 capsule 3  . budesonide (ENTOCORT EC) 3 MG 24 hr capsule Take 1 capsule (3 mg total) by mouth daily. 90 capsule 0  . CALCIUM PO Take 600 mg by mouth daily.    . cetirizine (ZYRTEC) 10 MG tablet Take 10 mg by mouth daily.    . Cholecalciferol (VITAMIN D PO) Take 5,000 Int'l Units by mouth daily.    Marland Kitchen  Cyanocobalamin (VITAMIN B-12) 5000 MCG SUBL Place 5,000 mcg under the tongue daily.    . enalapril (VASOTEC) 20 MG tablet Take  1 tablet  Daily  for BP 90 tablet 0  . Flaxseed Oil (LINSEED OIL) OIL Take 1,200 mg by mouth 2 (two) times daily.     . hyoscyamine (LEVSIN SL) 0.125 MG SL tablet DISSOLVE 1 TABLET IN MOUTH EVERY 4 HOURS AS NEEDED FOR CRAMPS AND FOR NAUSEA 90 tablet 0  . Multiple Vitamin (MULTIVITAMIN) capsule Take 1 capsule by mouth daily.    . Probiotic Product (PROBIOTIC DAILY PO) Take by mouth daily.    . simvastatin (ZOCOR) 40 MG tablet Take      1 tablet       at Bedtime       for Cholesterol  (Patient taking differently: Take 20 mg by mouth daily.) 90 tablet 0   No current facility-administered medications for this visit.    Allergies as of 04/13/2020  . (No Known Allergies)    Family History  Problem Relation Age of Onset  . Cancer Mother        uterine, kidney  . Hypertension Mother   . Colon polyps Mother   . Stomach cancer Mother   . Diabetes Father   . Hypertension Father   . Stroke Father   . Cancer Father        kidney  . Colon cancer Maternal Grandmother   . Colon cancer Maternal Grandfather   . Breast cancer Paternal Aunt   . Esophageal cancer Neg Hx   . Rectal cancer Neg Hx     Social History   Socioeconomic History  . Marital status: Married    Spouse name: Not on file  . Number of children: 1  . Years of education: Not on file  . Highest education level: Not on file  Occupational History  . Not on file  Tobacco Use  . Smoking status: Never Smoker  . Smokeless tobacco: Never Used  Vaping Use  . Vaping Use: Never used  Substance and Sexual Activity  . Alcohol use: No  . Drug use: No  . Sexual activity: Yes  Other Topics Concern  . Not on file  Social History Narrative  . Not on file   Social Determinants of Health   Financial Resource Strain: Not on file  Food Insecurity: Not on file  Transportation Needs: Not on file  Physical Activity: Not on file  Stress: Not on file  Social Connections: Not on file  Intimate Partner Violence: Not on file     Physical Exam: Ht _0  (1.549 m)   Wt 130 lb (59 kg)   BMI 24.56 kg/m  Constitutional: generally well-appearing Psychiatric: alert and oriented x3 Abdomen: soft, nontender, nondistended, no obvious ascites, no peritoneal signs, normal bowel sounds No peripheral edema noted in lower extremities  Assessment and plan: 65 y.o. female with mild ulcerative colitis  The vast majority of her days are very good now.  2 or 3 days/month she has a bad day of loose stools without bleeding  and some abdominal cramping.  Most days she is having 1 or 2 solid nonbloody bowel movements.  I am having her stop her Entocort but continue full-strength balsalazide at 3 pills 3 times daily and a single Imodium shortly after breakfast every morning.  She knows to call if she has any worsening of her symptoms, otherwise she will return to see me in the office in 4 months  Please see the "Patient Instructions" section for addition details about the plan.  Owens Loffler, MD [facility] 04/13/2020, 10:51 AM   Total time on date of encounter was 30 minutes (this included time spent preparing to see the patient reviewing records; obtaining and/or reviewing separately obtained history; performing a medically appropriate exam and/or evaluation; counseling and educating the patient and family if present; ordering medications, tests or procedures if applicable; and documenting clinical information in the health record).

## 2020-04-13 NOTE — Patient Instructions (Signed)
If you are age 65 or older, your body mass index should be between 23-30. Your Body mass index is 24.56 kg/m. If this is out of the aforementioned range listed, please consider follow up with your Primary Care Provider.  If you are age 78 or younger, your body mass index should be between 19-25. Your Body mass index is 24.56 kg/m. If this is out of the aformentioned range listed, please consider follow up with your Primary Care Provider.   Stop Budesonide.  Continue your Balsalazide. Continue Imodium 1 tablet daily.  Call the office if you are feeling worse.  Thank you for entrusting me with your care and choosing [facility].  Dr [name]

## 2020-04-21 NOTE — Progress Notes (Addendum)
[facility] Note  04/23/2020     CHIEF COMPLAINT Patient presents for Retina Evaluation   HISTORY OF PRESENT ILLNESS: Vicki Hansen is a 65 y.o. female who presents to the clinic today for:   HPI    Retina Evaluation    In right eye.  I, the attending physician,  performed the HPI with the patient and updated documentation appropriately.          Comments    Patient here for Retina Evaluation. Referred by Dr Kathlen Mody for BRVO OD. Patient states vision doing fine. No eye pain. Dr. Kathlen Mody saw something in retina.        Last edited by Bernarda Caffey, MD on 04/23/2020  1:19 PM. (History)    pt is here on the referral of Dr. 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 Quentin Ore for concern BRVO OD LEE:  Ocular Hx- PMH-    CURRENT MEDICATIONS: No current outpatient medications on file. (Ophthalmic Drugs)   No current facility-administered medications for this visit. (Ophthalmic Drugs)   Current Outpatient Medications (Other)  Medication Sig  . aspirin EC 81 MG tablet Take 81 mg by mouth daily.  . balsalazide (COLAZAL) 750 MG capsule TAKE 3 CAPSULES BY MOUTH THREE TIMES DAILY  . CALCIUM PO Take 600 mg by mouth daily.  . cetirizine (ZYRTEC) 10 MG tablet Take 10 mg by mouth daily.  . Cholecalciferol (VITAMIN D PO) Take 5,000 Int'l Units by mouth daily.  . Cyanocobalamin (VITAMIN B-12) 5000 MCG SUBL Place 5,000 mcg under the tongue daily.  . enalapril (VASOTEC) 20 MG tablet Take  1 tablet  Daily  for BP  . Flaxseed Oil  (LINSEED OIL) OIL Take 1,200 mg by mouth 2 (two) times daily.   . hyoscyamine (LEVSIN SL) 0.125 MG SL tablet DISSOLVE 1 TABLET IN MOUTH EVERY 4 HOURS AS NEEDED FOR CRAMPS AND FOR NAUSEA  . Multiple Vitamin (MULTIVITAMIN) capsule Take 1 capsule by mouth daily.  . Probiotic Product (PROBIOTIC DAILY PO) Take by mouth daily.  . simvastatin (ZOCOR) 40 MG tablet Take      1 tablet       at Bedtime       for Cholesterol (Patient taking differently: Take 20 mg by mouth daily.)   No current facility-administered medications for this visit. (Other)      REVIEW OF SYSTEMS: ROS    Positive for: Eyes   Last edited by Theodore Demark, COA on 04/23/2020  9:06 AM. (History)       ALLERGIES No Known Allergies  PAST MEDICAL HISTORY Past Medical History:  Diagnosis Date  . Allergy   . Arthritis    BACK   . Cancer (HCC)    UTERINE   . Cataract    MILD  . GERD (gastroesophageal reflux disease)   . Hyperlipidemia   . Hypertension   . Iritis    HLA B27 +  . Prediabetes   . S/P ORIF (open reduction internal fixation) fracture 1994   Right  tib/fib  . Thyroiditis, subacute   . Vitamin D deficiency    Past Surgical History:  Procedure Laterality Date  . CESAREAN SECTION    . COLONOSCOPY  03/17/2005   HEMS   . KNEE ARTHROSCOPY Left 1972  . right leg repair  1994   rod   . ROBOTIC ASSISTED TOTAL HYSTERECTOMY  2015   pt. states she had robot assisted hysterectomy and uterus was removed abdominally due to scar tissue.  she also states that both tubes and ovaries were removed.  . STAPEDECTOMY Left 2003  . TONSILLECTOMY AND ADENOIDECTOMY      FAMILY HISTORY Family History  Problem Relation Age of Onset  . Cancer Mother        uterine, kidney  . Hypertension Mother   . Colon polyps Mother   . Stomach cancer Mother   . Diabetes Father   . Hypertension Father   . Stroke Father   . Cancer Father        kidney  . Colon cancer Maternal Grandmother   . Colon cancer Maternal Grandfather    . Breast cancer Paternal Aunt   . Esophageal cancer Neg Hx   . Rectal cancer Neg Hx     SOCIAL HISTORY Social History   Tobacco Use  . Smoking status: Never Smoker  . Smokeless tobacco: Never Used  Vaping Use  . Vaping Use: Never used  Substance Use Topics  . Alcohol use: No  . Drug use: No         OPHTHALMIC EXAM:  Base Eye Exam    Visual Acuity (Snellen - Linear)      Right Left   Dist cc 20/30 20/40   Dist ph cc NI 20/30 -2   Correction: Glasses       Tonometry (Tonopen, 9:01 AM)      Right Left   Pressure 17 20       Pupils      Dark Light Shape React APD   Right 3 2 Round Brisk None   Left 3 2 Round Brisk None       Visual Fields (Counting fingers)      Left Right    Full Full       Extraocular Movement      Right Left    Full, Ortho Full, Ortho       Neuro/Psych    Oriented x3: Yes   Mood/Affect: Normal       Dilation    Both eyes: 1.0% Mydriacyl, 2.5% Phenylephrine @ 9:01 AM        Slit Lamp and Fundus Exam    Slit Lamp Exam      Right Left   Lids/Lashes Dermatochalasis - upper lid, Telangiectasia Dermatochalasis - upper lid, Telangiectasia   Conjunctiva/Sclera White and quiet White and quiet   Cornea mild arcus mild arcus   Anterior Chamber deep and clear, No cell or flare deep and clear, No cell or flare   Iris Round and dilated Round and dilated   Lens 2+ Nuclear sclerosis, 2+ Cortical cataract, focal pigment deposition on anterior and posterior capsules 2-3+ Nuclear sclerosis, 2-3+ Cortical cataract, focal pigment deposition on anterior and posterior capsules   Vitreous Vitreous syneresis, no cell, Posterior vitreous detachment Vitreous syneresis, no cell       Fundus Exam      Right Left   Disc Pink and Sharp Pink and Sharp   C/D Ratio 0.2 0.2   Macula Flat, Blunted foveal reflex, cluster  of DBH SN fovea and macula--mild focal BRVO Flat, Good foveal reflex, RPE mottling and clumping, No heme or edema   Vessels attenuated,  Tortuous, +superior BRVO - mild  attenuated, Tortuous, mild AV crossing changes   Periphery Attached, No heme  Attached           Refraction    Wearing Rx      Sphere Cylinder Axis Add   Right -6.75 +2.50 101 +2.50   Left -7.25 +2.50 091 +2.50       Manifest Refraction (Auto)      Sphere Cylinder Axis Dist VA   Right -7.00 +2.25 093 20/20   Left -7.75 +2.25 095 20/20          IMAGING AND PROCEDURES  Imaging and Procedures for 04/23/2020  OCT, Retina - OU - Both Eyes       Right Eye Quality was good. Central Foveal Thickness: 270. Progression has no prior data. Findings include no IRF, no SRF, intraretinal hyper-reflective material, abnormal foveal contour (Focal edema with IRHM SN fovea/macula).   Left Eye Quality was good. Central Foveal Thickness: 243. Progression has no prior data. Findings include normal foveal contour, no IRF, no SRF.   Notes *Images captured and stored on drive  Diagnosis / Impression:  OD: Focal edema with IRHM SN fovea/macula; no frank IRF or SRF OS: NFP, no IRF/SRF  Clinical management:  See below  Abbreviations: NFP - Normal foveal profile. CME - cystoid macular edema. PED - pigment epithelial detachment. IRF - intraretinal fluid. SRF - subretinal fluid. EZ - ellipsoid zone. ERM - epiretinal membrane. ORA - outer retinal atrophy. ORT - outer retinal tubulation. SRHM - subretinal hyper-reflective material. IRHM - intraretinal hyper-reflective material               ASSESSMENT/PLAN:    ICD-10-CM   1. Branch retinal vein occlusion of right eye with macular edema  H34.8310   2. Retinal edema  H35.81 OCT, Retina - OU - Both Eyes  3. Essential hypertension  I10   4. Hypertensive retinopathy of both eyes  H35.033   5. Combined forms of age-related cataract of both eyes  H25.813     1,2. BRVO w/ CME OD - The natural history of retinal vein occlusion and macular edema and treatment options including observation, laser photocoagulation,  and intravitreal antiVEGF injection with Avastin and Lucentis and Eylea and intravitreal injection of steroids with triamcinolone and Ozurdex and the complications of these procedures including loss of vision, infection, cataract, glaucoma, and retinal detachment were discussed with patient. - Specifically discussed findings from [place] / [place] study regarding patient stabilization with anti-VEGF agents and increased potential for visual improvements.  Also discussed need for frequent follow up and potentially multiple injections given the chronic nature of the disease process - BCVA 20/30 - exam and OCT show focal edema with IRHM/heme SN fovea/macula  - recommend observation for now -- pt in agreement - F/U 4-6 weeks -- DFE/OCT, FA OD, possible injection  3,4. Hypertensive retinopathy OU - discussed importance of tight BP control - monitor  5. Mixed Cataract OU - The symptoms of cataract, surgical options, and treatments and risks were discussed with patient. - discussed diagnosis and progression - monitor for now   Ophthalmic Meds Ordered this visit:  No orders of the defined types were placed in this encounter.      Return for f/u 4-6 weeks, BRVO OD, DFE, OCT, FA (transit OD).  There are no Patient Instructions on  file for this visit.   Explained the diagnoses, plan, and follow up with the patient and they expressed understanding.  Patient expressed understanding of the importance of proper follow up care.   This document serves as a record of services personally performed by Gardiner Sleeper, MD, PhD. It was created on their behalf by San Jetty. Owens Shark, OA an ophthalmic technician. The creation of this record is the provider's dictation and/or activities during the visit.    Electronically signed by: San Jetty. Owens Shark, [place] 04.06.2022 1:24 PM  Gardiner Sleeper, M.D., Ph.D. Diseases & Surgery of the Retina and Vitreous [facility]  I have reviewed the above  documentation for accuracy and completeness, and I agree with the above. Gardiner Sleeper, M.D., Ph.D. 04/23/20 1:24 PM  Abbreviations: M myopia (nearsighted); A astigmatism; H hyperopia (farsighted); P presbyopia; Mrx spectacle prescription;  CTL contact lenses; OD right eye; OS left eye; OU both eyes  XT exotropia; ET esotropia; PEK punctate epithelial keratitis; PEE punctate epithelial erosions; DES dry eye syndrome; MGD meibomian gland dysfunction; ATs artificial tears; PFAT's preservative free artificial tears; [place] nuclear sclerotic cataract; PSC posterior subcapsular cataract; ERM epi-retinal membrane; PVD posterior vitreous detachment; RD retinal detachment; DM diabetes mellitus; DR diabetic retinopathy; NPDR non-proliferative diabetic retinopathy; PDR proliferative diabetic retinopathy; CSME clinically significant macular edema; DME diabetic macular edema; dbh dot blot hemorrhages; CWS cotton wool spot; POAG primary open angle glaucoma; C/D cup-to-disc ratio; HVF humphrey visual field; GVF goldmann visual field; OCT optical coherence tomography; IOP intraocular pressure; BRVO Branch retinal vein occlusion; CRVO central retinal vein occlusion; CRAO central retinal artery occlusion; BRAO branch retinal artery occlusion; RT retinal tear; SB scleral buckle; PPV pars plana vitrectomy; VH Vitreous hemorrhage; PRP panretinal laser photocoagulation; IVK intravitreal kenalog; VMT vitreomacular traction; MH Macular hole;  NVD neovascularization of the disc; NVE neovascularization elsewhere; AREDS age related eye disease study; ARMD age related macular degeneration; POAG primary open angle glaucoma; EBMD epithelial/anterior basement membrane dystrophy; ACIOL anterior chamber intraocular lens; IOL intraocular lens; PCIOL posterior chamber intraocular lens; Phaco/IOL phacoemulsification with intraocular lens placement; [place] photorefractive keratectomy; LASIK laser assisted in situ keratomileusis; HTN hypertension; DM  diabetes mellitus; COPD chronic obstructive pulmonary disease

## 2020-04-23 ENCOUNTER — Ambulatory Visit (INDEPENDENT_AMBULATORY_CARE_PROVIDER_SITE_OTHER): Payer: 59 | Admitting: Ophthalmology

## 2020-04-23 ENCOUNTER — Encounter (INDEPENDENT_AMBULATORY_CARE_PROVIDER_SITE_OTHER): Payer: Self-pay | Admitting: Ophthalmology

## 2020-04-23 ENCOUNTER — Other Ambulatory Visit: Payer: Self-pay

## 2020-04-23 DIAGNOSIS — H3581 Retinal edema: Secondary | ICD-10-CM | POA: Diagnosis not present

## 2020-04-23 DIAGNOSIS — H34831 Tributary (branch) retinal vein occlusion, right eye, with macular edema: Secondary | ICD-10-CM

## 2020-04-23 DIAGNOSIS — H35033 Hypertensive retinopathy, bilateral: Secondary | ICD-10-CM | POA: Diagnosis not present

## 2020-04-23 DIAGNOSIS — I10 Essential (primary) hypertension: Secondary | ICD-10-CM

## 2020-04-23 DIAGNOSIS — H25813 Combined forms of age-related cataract, bilateral: Secondary | ICD-10-CM

## 2020-05-16 ENCOUNTER — Encounter: Payer: Self-pay | Admitting: Internal Medicine

## 2020-05-16 NOTE — Patient Instructions (Signed)

## 2020-05-16 NOTE — Progress Notes (Signed)
Annual Screening/Preventative Visit & Comprehensive Evaluation &  Examination   Future Appointments  Date Time Provider [place]  05/17/2020  2:00 PM [name], MD [facility] None  05/28/2020  9:30 AM Bernarda Caffey, MD [facility] None  07/14/2020 10:50 AM Milus Banister, MD [facility]  05/20/2021 11:00 AM [name], MD [facility] None       This very nice 65 y.o. MWF presents for a Screening /Preventative Visit & comprehensive evaluation and management of multiple medical co-morbidities.  Patient has been followed for HTN, HLD, Prediabetes  and Vitamin D Deficiency. Dr [name] follows the patient for Ulcerative Colitis (2020). Patient relates recently referred by Dr Kathlen Mody to Dr Coralyn Pear for Asx retinal hemorrhages OD.       HTN predates circa 2010. Patient's BP has been controlled at home and patient denies any cardiac symptoms as chest pain, palpitations, shortness of breath, dizziness or ankle swelling. Today's         Patient's hyperlipidemia is controlled with diet and  Simvastatin. Patient denies myalgias or other medication SE's. Last lipids were at goal:  Lab Results  Component Value Date   CHOL 143 11/19/2019   HDL 44 (L) 11/19/2019   LDLCALC 76 11/19/2019   TRIG 156 (H) 11/19/2019   CHOLHDL 3.3 11/19/2019        Patient has hx/o prediabetes (A1c 5.8% /2014) and patient denies reactive hypoglycemic symptoms, visual blurring, diabetic polys or paresthesias. Last A1c was normal & at goal:  Lab Results  Component Value Date   HGBA1C 5.4 11/19/2019        Finally, patient has history of Vitamin D Deficiency and last Vitamin D was at goal:    Current Outpatient Medications on File Prior to Visit  Medication Sig  . aspirin EC 81 MG tablet Take 81 mg by mouth daily.  . balsalazide (COLAZAL) 750 MG capsule TAKE 3 CAPS  THREE TIMES DAILY  . CALCIUM  600 mg Takedaily.  . cetirizine  10 MG tablet Take  daily.  Marland Kitchen VITAMIN D 5,000 Units  Take  daily.   Marland Kitchen VITAMIN B-12 5000 MCG SL Place 5,000 mcg under the tongue daily.  . enalapril (20 MG tablet Take  1 tablet  Daily  for BP  . Flaxseed Oil 1,200 mg Take   2  times daily.   Marland Kitchen LEVSIN SL 0.125 MG SL tablet DISSOLVE 1 TABLET EVERY 4 HRS AS NEEDED FOR CRAMPS   . Multiple Vitamin  Take 1 capsule daily.  . Probiotic  Take bdaily.  . simvastatin  40 MG tablet Take 20 mg  daily.)     No Known Allergies  Past Medical History:  Diagnosis Date  . Allergy   . Arthritis    BACK   . Cancer (HCC)    UTERINE   . Cataract    MILD  . GERD (gastroesophageal reflux disease)   . Hyperlipidemia   . Hypertension   . Iritis    HLA B27 +  . Prediabetes   . S/P ORIF (open reduction internal fixation) fracture 1994   Right tib/fib  . Thyroiditis, subacute   . Vitamin D deficiency     Health Maintenance  Topic Date Due  . TETANUS/TDAP  07/17/2017  . COVID-19 Vaccine (2 - Booster for [employer] series) 06/18/2019  . PAP SMEAR-Modifier  06/13/2020  . INFLUENZA VACCINE  08/16/2020  . MAMMOGRAM  11/12/2021  . COLONOSCOPY  11/28/2028  . Hepatitis C Screening  Completed  . HIV Screening  Completed  . HPV VACCINES  Aged Out    Immunization History  Administered Date(s) Administered  . Influenza Inj Mdck Quad  12/03/2017, 11/13/2018  . Janssen (J&J) SARS-COV-2 Ailene Ards 04/23/2019  . PPD Test 09/30/2013, 05/13/2019  . Pneumococcal - 23 08/27/2008  . Tdap 07/18/2007  . Zoster 12/29/2015    Last Colon - Nov 2020 - Dr [name]  Last [facility] -  Dr [name] office  Past Surgical History:  Procedure Laterality Date  . CESAREAN SECTION    . COLONOSCOPY  03/17/2005   HEMS   . KNEE ARTHROSCOPY Left 1972  . right leg repair  1994   rod   . ROBOTIC ASSISTED TOTAL HYSTERECTOMY  2015   pt. states she had robot assisted hysterectomy and uterus was removed abdominally due to scar tissue.  she also states that both tubes and ovaries were removed.  . STAPEDECTOMY Left 2003  . TONSILLECTOMY AND ADENOIDECTOMY       Family History  Problem Relation Age of Onset  . Cancer Mother        uterine, kidney  . Hypertension Mother   . Colon polyps Mother   . Stomach cancer Mother   . Diabetes Father   . Hypertension Father   . Stroke Father   . Cancer Father        kidney  . Colon cancer Maternal Grandmother   . Colon cancer Maternal Grandfather   . Breast cancer Paternal Aunt   . Esophageal cancer Neg Hx   . Rectal cancer Neg Hx     Social History   Tobacco Use  . Smoking status: Never Smoker  . Smokeless tobacco: Never Used  Vaping Use  . Vaping Use: Never used  Substance Use Topics  . Alcohol use: No  . Drug use: No    ROS Constitutional: Denies fever, chills, weight loss/gain, headaches, insomnia,  night sweats, and change in appetite. Does c/o fatigue. Eyes: Denies redness, blurred vision, diplopia, discharge, itchy, watery eyes.  ENT: Denies discharge, congestion, post nasal drip, epistaxis, sore throat, earache, hearing loss, dental pain, Tinnitus, Vertigo, Sinus pain, snoring.  Cardio: Denies chest pain, palpitations, irregular heartbeat, syncope, dyspnea, diaphoresis, orthopnea, PND, claudication, edema Respiratory: denies cough, dyspnea, DOE, pleurisy, hoarseness, laryngitis, wheezing.  Gastrointestinal: Denies dysphagia, heartburn, reflux, water brash, pain, cramps, nausea, vomiting, bloating, diarrhea, constipation, hematemesis, melena, hematochezia, jaundice, hemorrhoids Genitourinary: Denies dysuria, frequency, urgency, nocturia, hesitancy, discharge, hematuria, flank pain Breast: Breast lumps, nipple discharge, bleeding.  Musculoskeletal: Denies arthralgia, myalgia, stiffness, Jt. Swelling, pain, limp, and strain/sprain. Denies falls. Skin: Denies puritis, rash, hives, warts, acne, eczema, changing in skin lesion Neuro: No weakness, tremor, incoordination, spasms, paresthesia, pain Psychiatric: Denies confusion, memory loss, sensory loss. Denies Depression. Endocrine:  Denies change in weight, skin, hair change, nocturia, and paresthesia, diabetic polys, visual blurring, hyper / hypo glycemic episodes.  Heme/Lymph: No excessive bleeding, bruising, enlarged lymph nodes.  Physical Exam  Pulse 76   Temp 97.7 F (36.5 C)   Resp 16   Ht 5' 0.75" (1.543 m)   Wt 126 lb (57.2 kg)   SpO2 99%   BMI 24.00 kg/m   General Appearance: Well nourished, well groomed and in no apparent distress.  Eyes: PERRLA, EOMs, conjunctiva no swelling or erythema, normal fundi and vessels. Sinuses: No frontal/maxillary tenderness ENT/Mouth: EACs patent / TMs  nl. Nares clear without erythema, swelling, mucoid exudates. Oral hygiene is good. No erythema, swelling, or exudate. Tongue normal, non-obstructing. Tonsils not swollen or erythematous. Hearing normal.  Neck: Supple, thyroid not palpable. No bruits, nodes or JVD. Respiratory: Respiratory effort normal.  BS equal and clear bilateral without rales, rhonci, wheezing or stridor. Cardio: Heart sounds are normal with regular rate and rhythm and no murmurs, rubs or gallops. Peripheral pulses are normal and equal bilaterally without edema. No aortic or femoral bruits. Chest: symmetric with normal excursions and percussion. Breasts: Symmetric, without lumps, nipple discharge, retractions, or fibrocystic changes.  Abdomen: Flat, soft with bowel sounds active. Nontender, no guarding, rebound, hernias, masses, or organomegaly.  Lymphatics: Non tender without lymphadenopathy.  Musculoskeletal: Full ROM all peripheral extremities, joint stability, 5/5 strength, and normal gait. Skin: Warm and dry without rashes, lesions, cyanosis, clubbing or  ecchymosis.  Neuro: Cranial nerves intact, reflexes equal bilaterally. Normal muscle tone, no cerebellar symptoms. Sensation intact.  Pysch: Alert and oriented X 3, normal affect, Insight and Judgment appropriate.   Assessment and Plan  1. Annual Preventative Screening Examination   2.  Essential hypertension  - EKG 12-Lead - [place], RETROPERITNL ABD,  LTD - Urinalysis, Routine w reflex microscopic - Microalbumin / creatinine urine ratio - CBC with Differential/Platelet - COMPLETE METABOLIC PANEL WITH GFR - Magnesium - TSH  3. Hyperlipidemia, mixed  - EKG 12-Lead - [place], RETROPERITNL ABD,  LTD - Lipid panel - TSH  4. Abnormal glucose  - EKG 12-Lead - [place], RETROPERITNL ABD,  LTD - Hemoglobin A1c - Insulin, random  5. Vitamin D deficiency  - VITAMIN D 25 Hydroxy   6. Ulcerative colitis  ([place])  - TB Skin Test  7. Screening-pulmonary TB   8. Screening for ischemic heart disease   9. FH: heart disease  - EKG 12-Lead  10. Screening for AAA (aortic abdominal aneurysm)  - EKG 12-Lead - [place], RETROPERITNL ABD,  LTD  11. Fatigue, unspecified type  - Iron,Total/Total Iron Binding Cap - Vitamin B12 - CBC with Differential/Platelet - TSH  12. Medication management  - Urinalysis, Routine w reflex microscopic - CBC with Differential/Platelet - COMPLETE METABOLIC PANEL WITH GFR - Magnesium - Lipid panel - TSH - Hemoglobin A1c - Insulin, random - VITAMIN D 25 Hydroxy 13. Screening for colorectal cancer  - POC Hemoccult Bld/Stl                Patient was counseled in prudent diet to achieve/maintain BMI less than 25 for weight control, BP monitoring, regular exercise and medications. Discussed med's effects and SE's. Screening labs and tests as requested with regular follow-up as recommended. Over 40 minutes of exam, counseling, chart review and high complex critical decision making was performed.   Kirtland Bouchard, MD

## 2020-05-17 ENCOUNTER — Other Ambulatory Visit: Payer: Self-pay | Admitting: Gastroenterology

## 2020-05-17 ENCOUNTER — Ambulatory Visit (INDEPENDENT_AMBULATORY_CARE_PROVIDER_SITE_OTHER): Payer: 59 | Admitting: Internal Medicine

## 2020-05-17 ENCOUNTER — Telehealth: Payer: Self-pay | Admitting: Gastroenterology

## 2020-05-17 ENCOUNTER — Encounter: Payer: Self-pay | Admitting: Internal Medicine

## 2020-05-17 ENCOUNTER — Other Ambulatory Visit: Payer: Self-pay

## 2020-05-17 VITALS — BP 138/82 | HR 76 | Temp 97.7°F | Resp 16 | Ht 60.75 in | Wt 126.0 lb

## 2020-05-17 DIAGNOSIS — Z8249 Family history of ischemic heart disease and other diseases of the circulatory system: Secondary | ICD-10-CM | POA: Diagnosis not present

## 2020-05-17 DIAGNOSIS — R7309 Other abnormal glucose: Secondary | ICD-10-CM

## 2020-05-17 DIAGNOSIS — Z13220 Encounter for screening for lipoid disorders: Secondary | ICD-10-CM

## 2020-05-17 DIAGNOSIS — R5383 Other fatigue: Secondary | ICD-10-CM

## 2020-05-17 DIAGNOSIS — Z1389 Encounter for screening for other disorder: Secondary | ICD-10-CM

## 2020-05-17 DIAGNOSIS — Z0000 Encounter for general adult medical examination without abnormal findings: Secondary | ICD-10-CM

## 2020-05-17 DIAGNOSIS — Z136 Encounter for screening for cardiovascular disorders: Secondary | ICD-10-CM | POA: Diagnosis not present

## 2020-05-17 DIAGNOSIS — E782 Mixed hyperlipidemia: Secondary | ICD-10-CM

## 2020-05-17 DIAGNOSIS — Z131 Encounter for screening for diabetes mellitus: Secondary | ICD-10-CM

## 2020-05-17 DIAGNOSIS — K5190 Ulcerative colitis, unspecified, without complications: Secondary | ICD-10-CM

## 2020-05-17 DIAGNOSIS — E559 Vitamin D deficiency, unspecified: Secondary | ICD-10-CM | POA: Diagnosis not present

## 2020-05-17 DIAGNOSIS — Z111 Encounter for screening for respiratory tuberculosis: Secondary | ICD-10-CM

## 2020-05-17 DIAGNOSIS — Z79899 Other long term (current) drug therapy: Secondary | ICD-10-CM | POA: Diagnosis not present

## 2020-05-17 DIAGNOSIS — Z130 Encounter for screening for diseases of the blood and blood-forming organs and certain disorders involving the immune mechanism: Secondary | ICD-10-CM

## 2020-05-17 DIAGNOSIS — Z1211 Encounter for screening for malignant neoplasm of colon: Secondary | ICD-10-CM

## 2020-05-17 DIAGNOSIS — I10 Essential (primary) hypertension: Secondary | ICD-10-CM

## 2020-05-17 DIAGNOSIS — Z0001 Encounter for general adult medical examination with abnormal findings: Secondary | ICD-10-CM

## 2020-05-17 MED ORDER — SIMVASTATIN 20 MG PO TABS: ORAL_TABLET | ORAL | 3 refills | Status: DC

## 2020-05-17 NOTE — Telephone Encounter (Signed)
Patient called said she wants to let Dr. [name] know that she had to start Budesonide on Saturday again and the pharmacy will be calling for refill request.

## 2020-05-17 NOTE — Telephone Encounter (Signed)
Refill has already be sent in

## 2020-05-17 NOTE — Progress Notes (Signed)
AortaScan < 3 cm. Within normal limits, per Dr Melford Aase.

## 2020-05-17 NOTE — Telephone Encounter (Signed)
Patient wanted you to know she had to restart Entocort and needs a refill.  Ok to refill?  Has an appointment in June.

## 2020-05-18 ENCOUNTER — Encounter: Payer: BC Managed Care – PPO | Admitting: Internal Medicine

## 2020-05-18 LAB — COMPLETE METABOLIC PANEL WITH GFR
AG Ratio: 2 (calc) (ref 1.0–2.5)
ALT: 14 U/L (ref 6–29)
AST: 21 U/L (ref 10–35)
Albumin: 4.7 g/dL (ref 3.6–5.1)
Alkaline phosphatase (APISO): 91 U/L (ref 37–153)
BUN: 15 mg/dL (ref 7–25)
CO2: 26 mmol/L (ref 20–32)
Calcium: 10 mg/dL (ref 8.6–10.4)
Chloride: 106 mmol/L (ref 98–110)
Creat: 0.71 mg/dL (ref 0.50–0.99)
GFR, Est African American: 104 mL/min/{1.73_m2} (ref >=60)
GFR, Est Non African American: 90 mL/min/{1.73_m2} (ref >=60)
Globulin: 2.4 g/dL (calc) (ref 1.9–3.7)
Glucose, Bld: 86 mg/dL (ref 65–99)
Potassium: 4.1 mmol/L (ref 3.5–5.3)
Sodium: 141 mmol/L (ref 135–146)
Total Bilirubin: 0.4 mg/dL (ref 0.2–1.2)
Total Protein: 7.1 g/dL (ref 6.1–8.1)

## 2020-05-18 LAB — CBC WITH DIFFERENTIAL/PLATELET
Absolute Monocytes: 437 cells/uL (ref 200–950)
Basophils Absolute: 31 cells/uL (ref 0–200)
Basophils Relative: 0.4 %
Eosinophils Absolute: 31 cells/uL (ref 15–500)
Eosinophils Relative: 0.4 %
HCT: 42.6 % (ref 35.0–45.0)
Hemoglobin: 14 g/dL (ref 11.7–15.5)
Lymphs Abs: 1763 cells/uL (ref 850–3900)
MCH: 29.9 pg (ref 27.0–33.0)
MCHC: 32.9 g/dL (ref 32.0–36.0)
MCV: 90.8 fL (ref 80.0–100.0)
MPV: 9.9 fL (ref 7.5–12.5)
Monocytes Relative: 5.6 %
Neutro Abs: 5538 cells/uL (ref 1500–7800)
Neutrophils Relative %: 71 %
Platelets: 270 10*3/uL (ref 140–400)
RBC: 4.69 10*6/uL (ref 3.80–5.10)
RDW: 12.1 % (ref 11.0–15.0)
Total Lymphocyte: 22.6 %
WBC: 7.8 10*3/uL (ref 3.8–10.8)

## 2020-05-18 LAB — IRON, TOTAL/TOTAL IRON BINDING CAP
%SAT: 29 % (calc) (ref 16–45)
Iron: 103 ug/dL (ref 45–160)
TIBC: 356 mcg/dL (calc) (ref 250–450)

## 2020-05-18 LAB — MICROALBUMIN / CREATININE URINE RATIO
Creatinine, Urine: 29 mg/dL (ref 20–275)
Microalb Creat Ratio: 10 mcg/mg creat (ref <30)
Microalb, Ur: 0.3 mg/dL

## 2020-05-18 LAB — URINALYSIS, ROUTINE W REFLEX MICROSCOPIC
Bilirubin Urine: NEGATIVE
Glucose, UA: NEGATIVE
Hgb urine dipstick: NEGATIVE
Ketones, ur: NEGATIVE
Leukocytes,Ua: NEGATIVE
Nitrite: NEGATIVE
Protein, ur: NEGATIVE
Specific Gravity, Urine: 1.007 (ref 1.001–1.035)
pH: 6 (ref 5.0–8.0)

## 2020-05-18 LAB — LIPID PANEL
Cholesterol: 157 mg/dL (ref <200)
HDL: 41 mg/dL — ABNORMAL LOW (ref >=50)
LDL Cholesterol (Calc): 84 mg/dL (calc)
Non-HDL Cholesterol (Calc): 116 mg/dL (calc) (ref <130)
Total CHOL/HDL Ratio: 3.8 (calc) (ref <5.0)
Triglycerides: 218 mg/dL — ABNORMAL HIGH (ref <150)

## 2020-05-18 LAB — VITAMIN D 25 HYDROXY (VIT D DEFICIENCY, FRACTURES): Vit D, 25-Hydroxy: 101 ng/mL — ABNORMAL HIGH (ref 30–100)

## 2020-05-18 LAB — MAGNESIUM: Magnesium: 2.2 mg/dL (ref 1.5–2.5)

## 2020-05-18 LAB — VITAMIN B12: Vitamin B-12: >2000 pg/mL — ABNORMAL HIGH (ref 200–1100)

## 2020-05-18 LAB — HEMOGLOBIN A1C
Hgb A1c MFr Bld: 5.5 % of total Hgb (ref <5.7)
Mean Plasma Glucose: 111 mg/dL
eAG (mmol/L): 6.2 mmol/L

## 2020-05-18 LAB — TSH: TSH: 0.81 mIU/L (ref 0.40–4.50)

## 2020-05-18 LAB — INSULIN, RANDOM: Insulin: 6.9 u[IU]/mL

## 2020-05-18 NOTE — Progress Notes (Signed)
============================================================ ============================================================  -    Iron levels - Normal & OK  ============================================================ ============================================================  -  Vitamin B12 levels  are extremely high   - which means that you have a 6 month supply in your liver, So . . . . . . . . . .  -    Recommend  that you decrease your Vitamin B12 t to take only 1 x /week  ============================================================ ============================================================  -  Total Chol = 157 and  LDL = 84 - Both  Excellent   - Very low risk for Heart Attack  / Stroke ========================================================  - But Triglycerides (   218  ) or fats in blood are too high  (goal is less than 150)    - Recommend avoid fried & greasy foods,  sweets / candy,   - Avoid white rice  (brown or wild rice or Quinoa is OK),   - Avoid white potatoes  (sweet potatoes are OK)   - Avoid anything made from white flour  - bagels, doughnuts, rolls, buns, biscuits, white and   wheat breads, pizza crust and traditional  pasta made of white flour & egg white  - (vegetarian pasta or spinach or wheat pasta is OK).    - Multi-grain bread is OK - like multi-grain flat bread or  sandwich thins.   - Avoid alcohol in excess.   - Exercise is also important. ============================================================ ============================================================  -  A1c - Normal - Great - No Diabetes  ! ============================================================ ============================================================  -  Vitamin D = 101 - Excellent - Please Keep dose same  ============================================================ ============================================================  -  All Else - CBC - Kidneys -  Electrolytes - Liver - Magnesium & Thyroid    - all  Normal / OK ============================================================ ============================================================  -  Keep up the [place] Work !  ============================================================ ============================================================

## 2020-05-26 NOTE — Progress Notes (Signed)
[facility] - Clinic Note  05/28/2020     CHIEF COMPLAINT Patient presents for Retina Follow Up   HISTORY OF PRESENT ILLNESS: Vicki Hansen is a 64 y.o. female who presents to the clinic today for:   HPI    Retina Follow Up    Patient presents with  CRVO/BRVO.  In right eye.  This started 5 weeks ago.  Since onset it is stable.  I, the attending physician,  performed the HPI with the patient and updated documentation appropriately.          Comments    Pt here for retinal follow up BRVO OD 5weeks. Pt states no vision changes or issues noted. No ocular pain or discomfort.        Last edited by , , MD on 05/28/2020  5:06 PM. (History)    No change in [facility] OU noticed.  Pt notes no problems.   Referring physician: Weaver, Christopher D, MD [address] [place],  [place] [address]  HISTORICAL INFORMATION:   Selected notes from the medical record:  Referred by Dr. Chris Weaver for concern BRVO OD   CURRENT MEDICATIONS: No current outpatient medications on file. (Ophthalmic Drugs)   No current facility-administered medications for this visit. (Ophthalmic Drugs)   Current Outpatient Medications (Other)  Medication Sig  . aspirin EC 81 MG tablet Take 81 mg by mouth daily.  . balsalazide (COLAZAL) 750 MG capsule TAKE 3 CAPSULES BY MOUTH THREE TIMES DAILY  . budesonide (ENTOCORT EC) 3 MG 24 hr capsule TAKE THREE CAPSULES BY MOUTH DAILY  . CALCIUM PO Take 600 mg by mouth daily.  . cetirizine (ZYRTEC) 10 MG tablet Take 10 mg by mouth daily.  . Cholecalciferol (VITAMIN D PO) Take 5,000 Int'l Units by mouth daily.  . Cyanocobalamin (VITAMIN B-12) 5000 MCG SUBL Place 5,000 mcg under the tongue daily.  . enalapril (VASOTEC) 20 MG tablet Take  1 tablet  Daily  for BP  . Flaxseed Oil (LINSEED OIL) OIL Take 1,200 mg by mouth 2 (two) times daily.   . hyoscyamine (LEVSIN SL) 0.125 MG SL tablet DISSOLVE 1 TABLET IN MOUTH EVERY 4 HOURS AS NEEDED FOR  CRAMPS AND FOR NAUSEA  . Multiple Vitamin (MULTIVITAMIN) capsule Take 1 capsule by mouth daily.  . Probiotic Product (PROBIOTIC DAILY PO) Take by mouth daily.  . simvastatin (ZOCOR) 20 MG tablet Take 1 tablet at Bedtime for Cholesterol   No current facility-administered medications for this visit. (Other)      REVIEW OF SYSTEMS: ROS    Positive for: Eyes   Last edited by Simpson, Makenzie E, COT on 05/28/2020  9:24 AM. (History)       ALLERGIES No Known Allergies  PAST MEDICAL HISTORY Past Medical History:  Diagnosis Date  . Allergy   . Arthritis    BACK   . Cancer (HCC)    UTERINE   . Cataract    MILD  . GERD (gastroesophageal reflux disease)   . Hyperlipidemia   . Hypertension   . Iritis    HLA B27 +  . Prediabetes   . S/P ORIF (open reduction internal fixation) fracture 1994   Right tib/fib  . Thyroiditis, subacute   . Vitamin D deficiency    Past Surgical History:  Procedure Laterality Date  . CESAREAN SECTION    . COLONOSCOPY  03/17/2005   HEMS   . KNEE ARTHROSCOPY Left 1972  . right leg repair  1994     rod   . ROBOTIC ASSISTED TOTAL HYSTERECTOMY  2015   pt. states she had robot assisted hysterectomy and uterus was removed abdominally due to scar tissue.  she also states that both tubes and ovaries were removed.  . STAPEDECTOMY Left 2003  . TONSILLECTOMY AND ADENOIDECTOMY      FAMILY HISTORY Family History  Problem Relation Age of Onset  . Cancer Mother        uterine, kidney  . Hypertension Mother   . Colon polyps Mother   . Stomach cancer Mother   . Diabetes Father   . Hypertension Father   . Stroke Father   . Cancer Father        kidney  . Colon cancer Maternal Grandmother   . Colon cancer Maternal Grandfather   . Breast cancer Paternal Aunt   . Esophageal cancer Neg Hx   . Rectal cancer Neg Hx     SOCIAL HISTORY Social History   Tobacco Use  . Smoking status: Never Smoker  . Smokeless tobacco: Never Used  Vaping Use  . Vaping  Use: Never used  Substance Use Topics  . Alcohol use: No  . Drug use: No         OPHTHALMIC EXAM:  Base Eye Exam    Visual Acuity (Snellen - Linear)      Right Left   Dist cc 20/25 +2 20/30   Dist ph cc  20/20 -2   Correction: Glasses       Tonometry (Tonopen, 9:29 AM)      Right Left   Pressure 12 15       Pupils      Dark Light Shape React APD   Right 3 2 Round Brisk None   Left 3 2 Round Brisk None       Visual Fields (Counting fingers)      Left Right    Full Full       Extraocular Movement      Right Left    Full, Ortho Full, Ortho       Neuro/Psych    Oriented x3: Yes   Mood/Affect: Normal       Dilation    Both eyes: 1.0% Mydriacyl, 2.5% Phenylephrine @ 9:30 AM        Slit Lamp and Fundus Exam    Slit Lamp Exam      Right Left   Lids/Lashes Dermatochalasis - upper lid, Telangiectasia Dermatochalasis - upper lid, Telangiectasia   Conjunctiva/Sclera White and quiet White and quiet   Cornea mild arcus, 1+ PEE, + tear film debris mild arcus, trace PEE and mild tear film debris   Anterior Chamber deep and clear, No cell or flare deep and clear, No cell or flare   Iris Round and dilated Round and dilated   Lens 2+ Nuclear sclerosis, 2+ Cortical cataract, focal pigment deposition on anterior and posterior capsules 2-3+ Nuclear sclerosis, 2-3+ Cortical cataract   Vitreous Vitreous syneresis, no cell, Posterior vitreous detachment Vitreous syneresis, no cell, PVD       Fundus Exam      Right Left   Disc Pink and Sharp Pink and Sharp   C/D Ratio 0.2 0.2   Macula Flat, Blunted foveal reflex, cluster of DBH SN fovea and macula--mild focal BRVO -- slightly improved? Flat, Good foveal reflex, RPE mottling and clumping, No heme or edema   Vessels attenuated, Tortuous, +superior BRVO - mild  attenuated, Tortuous, mild AV crossing changes   Periphery Attached, No   heme  Attached           Refraction    Wearing Rx      Sphere Cylinder Axis Add   Right  -6.75 +2.50 101 +2.50   Left -7.25 +2.50 091 +2.50          IMAGING AND PROCEDURES  Imaging and Procedures for 05/28/2020  OCT, Retina - OU - Both Eyes       Right Eye Quality was good. Central Foveal Thickness: 278. Progression has improved. Findings include abnormal foveal contour, no IRF, no SRF, intraretinal hyper-reflective material (Mild improvement in SN [facility]).   Left Eye Quality was good. Central Foveal Thickness: 245. Progression has been stable. Findings include normal foveal contour, no IRF, no SRF.   Notes *Images captured and stored on drive  Diagnosis / Impression:  OD: Mild improvement in SN IRHM OS: NFP, no IRF/SRF  Clinical management:  See below  Abbreviations: NFP - Normal foveal profile. CME - cystoid macular edema. PED - pigment epithelial detachment. IRF - intraretinal fluid. SRF - subretinal fluid. EZ - ellipsoid zone. ERM - epiretinal membrane. ORA - outer retinal atrophy. ORT - outer retinal tubulation. SRHM - subretinal hyper-reflective material. IRHM - intraretinal hyper-reflective material        Fluorescein Angiography Optos (Transit OD)       Right Eye   Progression has no prior data. Early phase findings include blockage (Mild focal blockage SN to fovea corresponding to heme on exam.). Mid/Late phase findings include blockage (No leakage.  No vascular perfusion defect or RVO.).   Left Eye   Progression has no prior data. Early phase findings include normal observations. Mid/Late phase findings include normal observations.   Notes **Images stored on drive**  Impression: OD: Mild focal blockage SN to fovea corresponding to heme on exam -- no leakage; no vascular perfusion defect OS: Normal study.                ASSESSMENT/PLAN:    ICD-10-CM   1. Branch retinal vein occlusion of right eye with macular edema  H34.8310   2. Retinal edema  H35.81 OCT, Retina - OU - Both Eyes  3. Essential hypertension  I10   4. Hypertensive  retinopathy of both eyes  H35.033 Fluorescein Angiography Optos (Transit OD)  5. Combined forms of age-related cataract of both eyes  H25.813     1,2. BRVO w/ CME OD - BCVA 20/25 - exam and OCT show mild improvement in SN IRHM - FA today, 5.13.22 shows mild focal blockage SN to fovea corresponding to heme on exam -- no leakage or vascular perfusion defects or NV or CNV - recommend observation for now -- pt in agreement - F/U 2-3 mos -- DFE/OCT  3,4. Hypertensive retinopathy OU - discussed importance of tight BP control - monitor  5. Mixed Cataract OU - The symptoms of cataract, surgical options, and treatments and risks were discussed with patient. - discussed diagnosis and progression - monitor for now   Ophthalmic Meds Ordered this visit:  No orders of the defined types were placed in this encounter.      Return for 2-3 mo f/u for BRVO w/CME OD w/DFE&OCT.  There are no Patient Instructions on file for this visit.   Explained the diagnoses, plan, and follow up with the patient and they expressed understanding.  Patient expressed understanding of the importance of proper follow up care.   This document serves as a record of services personally performed by Sharyon Cable.  Coralyn Pear, MD, PhD. It was created on their behalf by San Jetty. Owens Shark, OA an ophthalmic technician. The creation of this record is the provider's dictation and/or activities during the visit.    Electronically signed by: San Jetty. Owens Shark, [place] 05.11.2022 5:10 PM   This document serves as a record of services personally performed by Gardiner Sleeper, MD, PhD. It was created on their behalf by Estill Bakes, COT an ophthalmic technician. The creation of this record is the provider's dictation and/or activities during the visit.    Electronically signed by: Estill Bakes, COT 5.13.22 @ 5:10 PM  Gardiner Sleeper, M.D., Ph.D. Diseases & Surgery of the Retina and Vitreous [facility] 5.13.22  I have  reviewed the above documentation for accuracy and completeness, and I agree with the above. Gardiner Sleeper, M.D., Ph.D. 05/28/20 5:10 PM  Abbreviations: M myopia (nearsighted); A astigmatism; H hyperopia (farsighted); P presbyopia; Mrx spectacle prescription;  CTL contact lenses; OD right eye; OS left eye; OU both eyes  XT exotropia; ET esotropia; PEK punctate epithelial keratitis; PEE punctate epithelial erosions; DES dry eye syndrome; MGD meibomian gland dysfunction; ATs artificial tears; PFAT's preservative free artificial tears; [place] nuclear sclerotic cataract; PSC posterior subcapsular cataract; ERM epi-retinal membrane; PVD posterior vitreous detachment; RD retinal detachment; DM diabetes mellitus; DR diabetic retinopathy; NPDR non-proliferative diabetic retinopathy; PDR proliferative diabetic retinopathy; CSME clinically significant macular edema; DME diabetic macular edema; dbh dot blot hemorrhages; CWS cotton wool spot; POAG primary open angle glaucoma; C/D cup-to-disc ratio; HVF humphrey visual field; GVF goldmann visual field; OCT optical coherence tomography; IOP intraocular pressure; BRVO Branch retinal vein occlusion; CRVO central retinal vein occlusion; CRAO central retinal artery occlusion; BRAO branch retinal artery occlusion; RT retinal tear; SB scleral buckle; PPV pars plana vitrectomy; VH Vitreous hemorrhage; PRP panretinal laser photocoagulation; IVK intravitreal kenalog; VMT vitreomacular traction; MH Macular hole;  NVD neovascularization of the disc; NVE neovascularization elsewhere; AREDS age related eye disease study; ARMD age related macular degeneration; POAG primary open angle glaucoma; EBMD epithelial/anterior basement membrane dystrophy; ACIOL anterior chamber intraocular lens; IOL intraocular lens; PCIOL posterior chamber intraocular lens; Phaco/IOL phacoemulsification with intraocular lens placement; [place] photorefractive keratectomy; LASIK laser assisted in situ keratomileusis; HTN  hypertension; DM diabetes mellitus; COPD chronic obstructive pulmonary disease

## 2020-05-28 ENCOUNTER — Encounter (INDEPENDENT_AMBULATORY_CARE_PROVIDER_SITE_OTHER): Payer: Self-pay | Admitting: Ophthalmology

## 2020-05-28 ENCOUNTER — Ambulatory Visit (INDEPENDENT_AMBULATORY_CARE_PROVIDER_SITE_OTHER): Payer: 59 | Admitting: Ophthalmology

## 2020-05-28 ENCOUNTER — Other Ambulatory Visit: Payer: Self-pay

## 2020-05-28 DIAGNOSIS — H25813 Combined forms of age-related cataract, bilateral: Secondary | ICD-10-CM

## 2020-05-28 DIAGNOSIS — H35033 Hypertensive retinopathy, bilateral: Secondary | ICD-10-CM | POA: Diagnosis not present

## 2020-05-28 DIAGNOSIS — I10 Essential (primary) hypertension: Secondary | ICD-10-CM | POA: Diagnosis not present

## 2020-05-28 DIAGNOSIS — H3581 Retinal edema: Secondary | ICD-10-CM | POA: Diagnosis not present

## 2020-05-28 DIAGNOSIS — H34831 Tributary (branch) retinal vein occlusion, right eye, with macular edema: Secondary | ICD-10-CM | POA: Diagnosis not present

## 2020-06-06 ENCOUNTER — Other Ambulatory Visit: Payer: Self-pay | Admitting: Internal Medicine

## 2020-06-14 ENCOUNTER — Other Ambulatory Visit: Payer: Self-pay | Admitting: Gastroenterology

## 2020-07-14 ENCOUNTER — Other Ambulatory Visit (INDEPENDENT_AMBULATORY_CARE_PROVIDER_SITE_OTHER): Payer: 59

## 2020-07-14 ENCOUNTER — Ambulatory Visit (INDEPENDENT_AMBULATORY_CARE_PROVIDER_SITE_OTHER): Payer: 59 | Admitting: Gastroenterology

## 2020-07-14 ENCOUNTER — Encounter: Payer: Self-pay | Admitting: Gastroenterology

## 2020-07-14 DIAGNOSIS — R109 Unspecified abdominal pain: Secondary | ICD-10-CM

## 2020-07-14 DIAGNOSIS — R197 Diarrhea, unspecified: Secondary | ICD-10-CM | POA: Diagnosis not present

## 2020-07-14 DIAGNOSIS — R634 Abnormal weight loss: Secondary | ICD-10-CM

## 2020-07-14 LAB — BASIC METABOLIC PANEL
BUN: 14 mg/dL (ref 6–23)
CO2: 28 mEq/L (ref 19–32)
Calcium: 9.9 mg/dL (ref 8.4–10.5)
Chloride: 104 mEq/L (ref 96–112)
Creatinine, Ser: 0.71 mg/dL (ref 0.40–1.20)
GFR: 89.61 mL/min (ref >60.00)
Glucose, Bld: 84 mg/dL (ref 70–99)
Potassium: 3.7 mEq/L (ref 3.5–5.1)
Sodium: 140 mEq/L (ref 135–145)

## 2020-07-14 LAB — SEDIMENTATION RATE: Sed Rate: 4 mm/hr (ref 0–30)

## 2020-07-14 NOTE — Progress Notes (Signed)
Review of pertinent gastrointestinal problems: 1.  Routine risk for colon cancer.  Colonoscopy April 2018 showed no polyps.  Internal hemorrhoids were noted.  She was recommended to have repeat colonoscopy at 10-year interval for colon cancer screening.  Colonoscopy 2008 was normal. 2.  Chronic diarrhea led to colonoscopy November 2020.  Terminal ileum was normal.  There was mild erythema and granularity throughout the colon.  Biopsies were taken.  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  Budesonide was too expensive.  Imodium caused cramping in nausea but was quite effective even at low doses.  Tried to prescribe mesalamine January 2021 however ended up with sulfasalazine 1gram TID due to her insurance restrictions.  Prednisone trial with good partial response.  Again we tried prescribing Entocort 3 mg pills 3 pills once daily.  Very good response as of November 2021.  Switched to lower dose Entocort at 1 pill once daily and started full-strength balsalazide 3 pills 3 times daily and 1 Imodium pill once daily.   HPI: This is a very pleasant 65 year old woman whom I last saw 3 months ago.  Has been on balsalazide 3 pills 3 times daily and a single Imodium shortly after breakfast every morning.  She has lost 9 pounds since her last office visit here about 3 months ago.  Same scale.  Blood work May 2022 normal CBC, normal complete metabolic profile.  Her frequency, urgency returned when she completely went off of Entocort.  She called within just a few weeks of stopping it.  Since then she has been going about 10 times per day, watery, nonbloody.  She is having some postprandial abdominal pains.  She has lost 9 pounds since her last office visit here  She admits that stress might be playing a role as her mother passed away recently.  She has been taking full-strength balsalazide 3 pills 3 times daily as well as 3 pills of  Entocort and a single Imodium every morning for the past month or 2 and yet is still having 8-10 loose stools daily.   ROS: complete GI ROS as described in HPI, all other review negative.  Constitutional:  No unintentional weight loss   Past Medical History:  Diagnosis Date   Allergy    Arthritis    BACK    Cancer ([place])    UTERINE    Cataract    MILD   GERD (gastroesophageal reflux disease)    Hyperlipidemia    Hypertension    Iritis    HLA B27 +   Prediabetes    S/P ORIF (open reduction internal fixation) fracture 1994   Right tib/fib   Thyroiditis, subacute    Vitamin D deficiency     Past Surgical History:  Procedure Laterality Date   CESAREAN SECTION     COLONOSCOPY  03/17/2005   HEMS    KNEE ARTHROSCOPY Left 1972   right leg repair  1994   rod    ROBOTIC ASSISTED TOTAL HYSTERECTOMY  2015   pt. states she had robot assisted hysterectomy and uterus was removed abdominally due to scar tissue.  she also states that both tubes and ovaries were removed.   STAPEDECTOMY Left 2003   TONSILLECTOMY AND ADENOIDECTOMY      Current Outpatient Medications  Medication Sig Dispense Refill   aspirin EC 81 MG tablet Take 81 mg by mouth daily.     balsalazide (COLAZAL) 750 MG capsule Take 3 capsules (2,250 mg total)  by mouth 3 (three) times daily. 270 capsule 1   budesonide (ENTOCORT EC) 3 MG 24 hr capsule TAKE THREE CAPSULES BY MOUTH DAILY 90 capsule 3   CALCIUM PO Take 600 mg by mouth daily.     cetirizine (ZYRTEC) 10 MG tablet Take 10 mg by mouth daily.     Cholecalciferol (VITAMIN D PO) Take 5,000 Int'l Units by mouth daily.     Cyanocobalamin (VITAMIN B-12) 5000 MCG SUBL Place 5,000 mcg under the tongue daily.     enalapril (VASOTEC) 20 MG tablet Take 1 tablet  Daily  for BP 90 tablet 3   Flaxseed Oil (LINSEED OIL) OIL Take 1,200 mg by mouth 2 (two) times daily.      hyoscyamine (LEVSIN SL) 0.125 MG SL tablet DISSOLVE 1 TABLET IN MOUTH EVERY 4 HOURS AS NEEDED FOR CRAMPS  AND FOR NAUSEA 90 tablet 0   loperamide (IMODIUM A-D) 2 MG tablet Take 2 mg by mouth 4 (four) times daily as needed for diarrhea or loose stools.     Multiple Vitamin (MULTIVITAMIN) capsule Take 1 capsule by mouth daily.     Probiotic Product (PROBIOTIC DAILY PO) Take by mouth daily.     simvastatin (ZOCOR) 20 MG tablet Take 1 tablet at Bedtime for Cholesterol 90 tablet 3   No current facility-administered medications for this visit.    Allergies as of 07/14/2020   (No Known Allergies)    Family History  Problem Relation Age of Onset   Cancer Mother        uterine, kidney   Hypertension Mother    Colon polyps Mother    Stomach cancer Mother    Diabetes Father    Hypertension Father    Stroke Father    Cancer Father        kidney   Colon cancer Maternal Grandmother    Colon cancer Maternal Grandfather    Breast cancer Paternal Aunt    Esophageal cancer Neg Hx    Rectal cancer Neg Hx     Social History   Socioeconomic History   Marital status: Married    Spouse name: Not on file   Number of children: 1   Years of education: Not on file   Highest education level: Not on file  Occupational History   Not on file  Tobacco Use   Smoking status: Never   Smokeless tobacco: Never  Vaping Use   Vaping Use: Never used  Substance and Sexual Activity   Alcohol use: No   Drug use: No   Sexual activity: Yes  Other Topics Concern   Not on file  Social History Narrative   Not on file   Social Determinants of Health   Financial Resource Strain: Not on file  Food Insecurity: Not on file  Transportation Needs: Not on file  Physical Activity: Not on file  Stress: Not on file  Social Connections: Not on file  Intimate Partner Violence: Not on file     Physical Exam: BP 114/68   Pulse 74   Ht 5' 1"  (1.549 m)   Wt 121 lb (54.9 kg)   BMI 22.86 kg/m  Constitutional: generally well-appearing Psychiatric: alert and oriented x3 Abdomen: soft, slightly tender  midepigastrium, nondistended, no obvious ascites, no peritoneal signs, normal bowel sounds No peripheral edema noted in lower extremities  Assessment and plan: 65 y.o. female with frequency, urgency, weight loss, mild abdominal discomforts  Previously she was responding fairly well to either mild ulcerative colitis or microscopic  colitis treatment.  I favor mild ulcerative colitis.  Lately however she is not responding and symptoms have changed somewhat.  She is also having some abdominal discomfort and she has lost 9 pounds in the last 3 months.  Her mother passed and she admits she is under a lot of stress because of that but I am not sure that that is the only thing going on here and I do not want to miss anything more serious such as an occult neoplasm.  I told her we are going to do stool testing, blood work as well as a CT scan abdomen pelvis to start.  She understands that if these tests fail to explain her symptoms fully then I would probably recommend upper endoscopy and possibly even repeat colonoscopy.  She will continue her current regimen with balsalazide 3 pills 3 times daily, Entocort 3 pills once daily and Imodium once daily for now.  Please see the "Patient Instructions" section for addition details about the plan.  Owens Loffler, MD [facility] 07/14/2020, 10:51 AM   Total time on date of encounter was 35 minutes (this included time spent preparing to see the patient reviewing records; obtaining and/or reviewing separately obtained history; performing a medically appropriate exam and/or evaluation; counseling and educating the patient and family if present; ordering medications, tests or procedures if applicable; and documenting clinical information in the health record).

## 2020-07-14 NOTE — Patient Instructions (Signed)
If you are age 65 or younger, your body mass index should be between 19-25. Your Body mass index is 22.86 kg/m. If this is out of the aformentioned range listed, please consider follow up with your Primary Care Provider.   _________________________________________________________  The [facility] providers would like to encourage you to use [facility] to communicate with providers for non-urgent requests or questions.  Due to long hold times on the telephone, sending your provider a message by [facility] may be a faster and more efficient way to get a response.  Please allow 48 business hours for a response.  Please remember that this is for non-urgent requests.  _________________________________________________________  Your provider has requested that you go to the basement level for lab work before leaving today. Press "B" on the elevator. The lab is located at the first door on the left as you exit the elevator.  Due to recent changes in healthcare laws, you may see the results of your imaging and laboratory studies on MyChart before your provider has had a chance to review them.  We understand that in some cases there may be results that are confusing or concerning to you. Not all laboratory results come back in the same time frame and the provider may be waiting for multiple results in order to interpret others.  Please give [place] 48 hours in order for your provider to thoroughly review all the results before contacting the office for clarification of your results.   You have been scheduled for a CT scan of the abdomen and pelvis at [facility] ([address]---this is in the same building as [employer]).   You are scheduled on 07-15-2020 at 330pm. You should arrive 15 minutes prior to your appointment time for registration. Please follow the written instructions below on the day of your exam:  WARNING: IF YOU ARE ALLERGIC TO IODINE/X-RAY DYE, PLEASE NOTIFY RADIOLOGY IMMEDIATELY  AT [phone]! YOU WILL BE GIVEN A 13 HOUR PREMEDICATION PREP.  1) Do not eat or drink anything after 1130am (4 hours prior to your test) 2) You have been given 2 bottles of oral contrast to drink. The solution may taste better if refrigerated, but do NOT add ice or any other liquid to this solution. Shake well before drinking.    Drink 1 bottle of contrast @ 130pm (2 hours prior to your exam)  Drink 1 bottle of contrast @ 230pm (1 hour prior to your exam)  You may take any medications as prescribed with a small amount of water, if necessary. If you take any of the following medications: METFORMIN, GLUCOPHAGE, GLUCOVANCE, AVANDAMET, RIOMET, FORTAMET, [place] MET, JANUMET, GLUMETZA or METAGLIP, you MAY be asked to HOLD this medication 48 hours AFTER the exam.  The purpose of you drinking the oral contrast is to aid in the visualization of your intestinal tract. The contrast solution may cause some diarrhea. Depending on your individual set of symptoms, you may also receive an intravenous injection of x-ray contrast/dye. Plan on being at [facility] for 30 minutes or longer, depending on the type of exam you are having performed.  This test typically takes 30-45 minutes to complete.  If you have any questions regarding your exam or if you need to reschedule, you may call the CT department at [phone] between the hours of 8:00 am and 5:00 pm, Monday-Friday.  _________________________________________________________  Thank you for entrusting me with your care and choosing [facility].  Dr [name]

## 2020-07-15 ENCOUNTER — Other Ambulatory Visit: Payer: Self-pay

## 2020-07-15 ENCOUNTER — Ambulatory Visit (INDEPENDENT_AMBULATORY_CARE_PROVIDER_SITE_OTHER)
Admission: RE | Admit: 2020-07-15 | Discharge: 2020-07-15 | Disposition: A | Discharge status: Home / Self Care | Payer: 59 | Source: Ambulatory Visit | Attending: Gastroenterology | Admitting: Gastroenterology

## 2020-07-15 DIAGNOSIS — R109 Unspecified abdominal pain: Secondary | ICD-10-CM

## 2020-07-15 DIAGNOSIS — R634 Abnormal weight loss: Secondary | ICD-10-CM

## 2020-07-15 DIAGNOSIS — R197 Diarrhea, unspecified: Secondary | ICD-10-CM

## 2020-07-15 LAB — IGA: Immunoglobulin A: 74 mg/dL (ref 70–320)

## 2020-07-15 LAB — TISSUE TRANSGLUTAMINASE, IGA: (tTG) Ab, IgA: <1 U/mL

## 2020-07-15 MED ORDER — IOHEXOL 300 MG/ML  SOLN
100.0000 mL | Freq: Once | INTRAMUSCULAR | Status: AC | PRN
  Administered 2020-07-15: 100 mL via INTRAVENOUS

## 2020-07-16 LAB — HELICOBACTER PYLORI  SPECIAL ANTIGEN
MICRO NUMBER:: 12070119
SPECIMEN QUALITY: ADEQUATE

## 2020-07-18 LAB — GI PROFILE, STOOL, PCR

## 2020-08-08 ENCOUNTER — Other Ambulatory Visit: Payer: Self-pay | Admitting: Gastroenterology

## 2020-08-09 ENCOUNTER — Encounter (INDEPENDENT_AMBULATORY_CARE_PROVIDER_SITE_OTHER): Payer: 59 | Admitting: Ophthalmology

## 2020-08-17 ENCOUNTER — Encounter (INDEPENDENT_AMBULATORY_CARE_PROVIDER_SITE_OTHER): Payer: 59 | Admitting: Ophthalmology

## 2020-08-23 ENCOUNTER — Other Ambulatory Visit: Payer: Self-pay

## 2020-08-23 DIAGNOSIS — R634 Abnormal weight loss: Secondary | ICD-10-CM

## 2020-08-23 DIAGNOSIS — R197 Diarrhea, unspecified: Secondary | ICD-10-CM

## 2020-08-23 DIAGNOSIS — R109 Unspecified abdominal pain: Secondary | ICD-10-CM

## 2020-08-23 DIAGNOSIS — K51919 Ulcerative colitis, unspecified with unspecified complications: Secondary | ICD-10-CM

## 2020-08-23 MED ORDER — NA SULFATE-K SULFATE-MG SULF 17.5-3.13-1.6 GM/177ML PO SOLN: 1.0000 | Freq: Once | ORAL | 0 refills | Status: AC

## 2020-08-30 ENCOUNTER — Other Ambulatory Visit: Payer: Self-pay

## 2020-08-30 ENCOUNTER — Encounter (INDEPENDENT_AMBULATORY_CARE_PROVIDER_SITE_OTHER): Payer: 59 | Admitting: Ophthalmology

## 2020-08-30 ENCOUNTER — Encounter (INDEPENDENT_AMBULATORY_CARE_PROVIDER_SITE_OTHER): Payer: Self-pay

## 2020-08-30 DIAGNOSIS — I10 Essential (primary) hypertension: Secondary | ICD-10-CM

## 2020-08-30 DIAGNOSIS — H25813 Combined forms of age-related cataract, bilateral: Secondary | ICD-10-CM

## 2020-08-30 DIAGNOSIS — H34831 Tributary (branch) retinal vein occlusion, right eye, with macular edema: Secondary | ICD-10-CM

## 2020-08-30 DIAGNOSIS — H35033 Hypertensive retinopathy, bilateral: Secondary | ICD-10-CM

## 2020-08-30 DIAGNOSIS — H3581 Retinal edema: Secondary | ICD-10-CM

## 2020-09-01 ENCOUNTER — Other Ambulatory Visit: Payer: Self-pay | Admitting: Adult Health

## 2020-09-01 DIAGNOSIS — R197 Diarrhea, unspecified: Secondary | ICD-10-CM

## 2020-09-01 DIAGNOSIS — R109 Unspecified abdominal pain: Secondary | ICD-10-CM

## 2020-09-27 ENCOUNTER — Ambulatory Visit: Payer: 59 | Admitting: Adult Health

## 2020-09-28 ENCOUNTER — Ambulatory Visit: Payer: 59 | Admitting: Adult Health

## 2020-09-29 ENCOUNTER — Encounter: Payer: Self-pay | Admitting: Gastroenterology

## 2020-09-29 ENCOUNTER — Ambulatory Visit (AMBULATORY_SURGERY_CENTER): Payer: Medicare Other | Admitting: Gastroenterology

## 2020-09-29 ENCOUNTER — Other Ambulatory Visit: Payer: Self-pay

## 2020-09-29 VITALS — BP 104/71 | HR 65 | Temp 96.9°F | Resp 12 | Ht 61.0 in | Wt 121.0 lb

## 2020-09-29 DIAGNOSIS — K52831 Collagenous colitis: Secondary | ICD-10-CM

## 2020-09-29 DIAGNOSIS — R197 Diarrhea, unspecified: Secondary | ICD-10-CM

## 2020-09-29 DIAGNOSIS — R109 Unspecified abdominal pain: Secondary | ICD-10-CM

## 2020-09-29 DIAGNOSIS — K5150 Left sided colitis without complications: Secondary | ICD-10-CM | POA: Diagnosis not present

## 2020-09-29 DIAGNOSIS — K2970 Gastritis, unspecified, without bleeding: Secondary | ICD-10-CM

## 2020-09-29 DIAGNOSIS — K529 Noninfective gastroenteritis and colitis, unspecified: Secondary | ICD-10-CM | POA: Diagnosis not present

## 2020-09-29 DIAGNOSIS — K319 Disease of stomach and duodenum, unspecified: Secondary | ICD-10-CM

## 2020-09-29 MED ORDER — SODIUM CHLORIDE 0.9 % IV SOLN: 500.0000 mL | INTRAVENOUS | Status: DC

## 2020-09-29 NOTE — Progress Notes (Signed)
Review of pertinent gastrointestinal problems: 1.  Routine risk for colon cancer.  Colonoscopy April 2018 showed no polyps.  Internal hemorrhoids were noted.  She was recommended to have repeat colonoscopy at 10-year interval for colon cancer screening.  Colonoscopy 2008 was normal. 2.  Chronic diarrhea led to colonoscopy November 2020.  Terminal ileum was normal.  There was mild erythema and granularity throughout the colon.  Biopsies were taken.  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  Budesonide was too expensive.  Imodium caused cramping in nausea but was quite effective even at low doses.  Tried to prescribe mesalamine January 2021 however ended up with sulfasalazine 1gram TID due to her insurance restrictions.  Prednisone trial with good partial response.  Again we tried prescribing Entocort 3 mg pills 3 pills once daily.  Very good response as of November 2021.  Switched to lower dose Entocort at 1 pill once daily and started full-strength balsalazide 3 pills 3 times daily and 1 Imodium pill once daily.   HPI: This is a woman with chronic loose stools, unclear etiology. Weight loss   ROS: complete GI ROS as described in HPI, all other review negative.  Constitutional:  No unintentional weight loss   Past Medical History:  Diagnosis Date   Allergy    Arthritis    BACK    Cancer ([place])    UTERINE    Cataract    MILD   GERD (gastroesophageal reflux disease)    Hyperlipidemia    Hypertension    Iritis    HLA B27 +   Prediabetes    S/P ORIF (open reduction internal fixation) fracture 1994   Right tib/fib   Thyroiditis, subacute    Vitamin D deficiency     Past Surgical History:  Procedure Laterality Date   CESAREAN SECTION     COLONOSCOPY  03/17/2005   HEMS    KNEE ARTHROSCOPY Left 1972   right leg repair  1994   rod    ROBOTIC ASSISTED TOTAL HYSTERECTOMY  2015   pt. states she had robot  assisted hysterectomy and uterus was removed abdominally due to scar tissue.  she also states that both tubes and ovaries were removed.   STAPEDECTOMY Left 2003   TONSILLECTOMY AND ADENOIDECTOMY      Current Outpatient Medications  Medication Sig Dispense Refill   aspirin EC 81 MG tablet Take 81 mg by mouth daily.     balsalazide (COLAZAL) 750 MG capsule TAKE 3 CAPSULES BY MOUTH THREE TIMES DAILY 270 capsule 3   CALCIUM PO Take 600 mg by mouth daily.     cetirizine (ZYRTEC) 10 MG tablet Take 10 mg by mouth daily.     Cholecalciferol (VITAMIN D PO) Take 5,000 Int'l Units by mouth daily.     Cyanocobalamin (VITAMIN B-12) 5000 MCG SUBL Place 5,000 mcg under the tongue daily.     enalapril (VASOTEC) 20 MG tablet Take 1 tablet  Daily  for BP 90 tablet 3   Flaxseed Oil (LINSEED OIL) OIL Take 1,200 mg by mouth 2 (two) times daily.      hyoscyamine (LEVSIN SL) 0.125 MG SL tablet DISSOLVE 1 TABLET IN MOUTH EVERY 4 HOURS AS NEEDED FOR CRAMPS AND FOR NAUSEA 90 tablet 0   loperamide (IMODIUM A-D) 2 MG tablet Take 2 mg by mouth 4 (four) times daily as needed for diarrhea or loose stools.     Multiple Vitamin (MULTIVITAMIN) capsule Take 1 capsule by  mouth daily.     Probiotic Product (PROBIOTIC DAILY PO) Take by mouth daily.     simvastatin (ZOCOR) 20 MG tablet Take 1 tablet at Bedtime for Cholesterol 90 tablet 3   budesonide (ENTOCORT EC) 3 MG 24 hr capsule TAKE THREE CAPSULES BY MOUTH DAILY (Patient not taking: Reported on 09/29/2020) 90 capsule 3   Current Facility-Administered Medications  Medication Dose Route Frequency Provider Last Rate Last Admin   0.9 %  sodium chloride infusion  500 mL Intravenous Continuous Milus Banister, MD        Allergies as of 09/29/2020   (No Known Allergies)    Family History  Problem Relation Age of Onset   Cancer Mother        uterine, kidney   Hypertension Mother    Colon polyps Mother    Stomach cancer Mother    Diabetes Father    Hypertension Father     Stroke Father    Cancer Father        kidney   Colon cancer Maternal Grandmother    Colon cancer Maternal Grandfather    Breast cancer Paternal Aunt    Esophageal cancer Neg Hx    Rectal cancer Neg Hx     Social History   Socioeconomic History   Marital status: Married    Spouse name: Not on file   Number of children: 1   Years of education: Not on file   Highest education level: Not on file  Occupational History   Not on file  Tobacco Use   Smoking status: Never   Smokeless tobacco: Never  Vaping Use   Vaping Use: Never used  Substance and Sexual Activity   Alcohol use: No   Drug use: No   Sexual activity: Yes  Other Topics Concern   Not on file  Social History Narrative   Not on file   Social Determinants of Health   Financial Resource Strain: Not on file  Food Insecurity: Not on file  Transportation Needs: Not on file  Physical Activity: Not on file  Stress: Not on file  Social Connections: Not on file  Intimate Partner Violence: Not on file     Physical Exam: BP 138/88   Pulse 77   Temp (!) 96.9 F (36.1 C)   Resp 12   Ht 5' 1"  (1.549 m)   Wt 121 lb (54.9 kg)   SpO2 100%   BMI 22.86 kg/m  Constitutional: generally well-appearing Psychiatric: alert and oriented x3 Lungs: CTA bilaterally Heart: no MCR  Assessment and plan: 65 y.o. female with chronic loose stools, weight loss, abd pain  For colonoscoyp and egd today  Care is appropriate for the ambulatory setting.  Owens Loffler, MD [facility] 09/29/2020, 8:14 AM

## 2020-09-29 NOTE — Op Note (Signed)
[facility] Patient Name: Vicki Hansen Procedure Date: 09/29/2020 8:35 AM MRN: [id number] Endoscopist: Milus Banister , MD Age: 65 Referring MD:  Date of Birth: 1955-09-29 Gender: Female Account #: [id number] Procedure:                Upper GI endoscopy Indications:              Generalized abdominal pain, chronic loose stools Medicines:                Monitored Anesthesia Care Procedure:                Pre-Anesthesia Assessment:                           - Prior to the procedure, a History and Physical                            was performed, and patient medications and                            allergies were reviewed. The patient's tolerance of                            previous anesthesia was also reviewed. The risks                            and benefits of the procedure and the sedation                            options and risks were discussed with the patient.                            All questions were answered, and informed consent                            was obtained. Prior Anticoagulants: The patient has                            taken no previous anticoagulant or antiplatelet                            agents. ASA Grade Assessment: II - A patient with                            mild systemic disease. After reviewing the risks                            and benefits, the patient was deemed in                            satisfactory condition to undergo the procedure.                           After obtaining informed consent, the endoscope was  passed under direct vision. Throughout the                            procedure, the patient's blood pressure, pulse, and                            oxygen saturations were monitored continuously. The                            GIF [id number] was introduced through the                            mouth, and advanced to the second part of duodenum.                            The upper  GI endoscopy was accomplished without                            difficulty. The patient tolerated the procedure                            well. Scope In: Scope Out: Findings:                 The esophagus was normal.                           Moderate inflammation characterized by erosions,                            erythema and granularity was found in the gastric                            antrum. Biopsies were taken with a cold forceps for                            histology.                           The examined duodenum was normal. Biopsies for                            histology were taken with a cold forceps for                            evaluation of celiac disease.                           The exam was otherwise without abnormality. Complications:            No immediate complications. Estimated blood loss:                            None. Estimated Blood Loss:     Estimated blood loss: none. Impression:               - Normal esophagus.                           -  Non-specific distal gastritis. Biopsied to check                            for H. pylori                           - Normal examined duodenum. Biopsied to check for                            celiac sprue changes.                           - The examination was otherwise normal. Recommendation:           - Patient has a contact number available for                            emergencies. The signs and symptoms of potential                            delayed complications were discussed with the                            patient. Return to normal activities tomorrow.                            Written discharge instructions were provided to the                            patient.                           - Resume previous diet.                           - Continue present medications.                           - Await pathology results. Milus Banister, MD 09/29/2020 8:48:11 AM This report has been  signed electronically.

## 2020-09-29 NOTE — Patient Instructions (Signed)
Handouts on gastritis and hemorrhoids given top you today    YOU HAD AN ENDOSCOPIC PROCEDURE TODAY AT [facility]:   Refer to the procedure report that was given to you for any specific questions about what was found during the examination.  If the procedure report does not answer your questions, please call your gastroenterologist to clarify.  If you requested that your care partner not be given the details of your procedure findings, then the procedure report has been included in a sealed envelope for you to review at your convenience later.  YOU SHOULD EXPECT: Some feelings of bloating in the abdomen. Passage of more gas than usual.  Walking can help get rid of the air that was put into your GI tract during the procedure and reduce the bloating. If you had a lower endoscopy (such as a colonoscopy or flexible sigmoidoscopy) you may notice spotting of blood in your stool or on the toilet paper. If you underwent a bowel prep for your procedure, you may not have a normal bowel movement for a few days.  Please Note:  You might notice some irritation and congestion in your nose or some drainage.  This is from the oxygen used during your procedure.  There is no need for concern and it should clear up in a day or so.  SYMPTOMS TO REPORT IMMEDIATELY:  Following lower endoscopy (colonoscopy or flexible sigmoidoscopy):  Excessive amounts of blood in the stool  Significant tenderness or worsening of abdominal pains  Swelling of the abdomen that is new, acute  Fever of 100F or higher  Following upper endoscopy (EGD)  Vomiting of blood or coffee ground material  New chest pain or pain under the shoulder blades  Painful or persistently difficult swallowing  New shortness of breath  Fever of 100F or higher  Black, tarry-looking stools  For urgent or emergent issues, a gastroenterologist can be reached at any hour by calling [phone]. Do not use MyChart messaging for urgent  concerns.    DIET:  We do recommend a small meal at first, but then you may proceed to your regular diet.  Drink plenty of fluids but you should avoid alcoholic beverages for 24 hours.  ACTIVITY:  You should plan to take it easy for the rest of today and you should NOT DRIVE or use heavy machinery until tomorrow (because of the sedation medicines used during the test).    FOLLOW UP: Our staff will call the number listed on your records 48-72 hours following your procedure to check on you and address any questions or concerns that you may have regarding the information given to you following your procedure. If we do not reach you, we will leave a message.  We will attempt to reach you two times.  During this call, we will ask if you have developed any symptoms of COVID 19. If you develop any symptoms (ie: fever, flu-like symptoms, shortness of breath, cough etc.) before then, please call [phone].  If you test positive for Covid 19 in the 2 weeks post procedure, please call and report this information to [place].    If any biopsies were taken you will be contacted by phone or by letter within the next 1-3 weeks.  Please call us at [phone] if you have not heard about the biopsies in 3 weeks.    SIGNATURES/CONFIDENTIALITY: You and/or your care partner have signed paperwork which will be entered into your electronic medical record.  These signatures  attest to the fact that that the information above on your After Visit Summary has been reviewed and is understood.  Full responsibility of the confidentiality of this discharge information lies with you and/or your care-partner.

## 2020-09-29 NOTE — Progress Notes (Signed)
0759 Robinul 0.1 mg IV given due large amount of secretions upon assessment.  MD made aware, vss

## 2020-09-29 NOTE — Progress Notes (Signed)
Report given to PACU, vss 

## 2020-09-29 NOTE — Op Note (Signed)
[facility] Patient Name: Vicki Hansen Procedure Date: 09/29/2020 8:18 AM MRN: [id number] Endoscopist: Milus Banister , MD Age: 65 Referring MD:  Date of Birth: Apr 18, 1955 Gender: Female Account #: [id number] Procedure:                Colonoscopy Indications:              Chronic diarrhea, colonoscopy 2020 suggested UC vs.                            microscopic colitis, failing to respond to usual                            treatment now. Currently on entocort 61m daily and                            still having multiple loose nonbloody stools daily. Medicines:                Monitored Anesthesia Care Procedure:                Pre-Anesthesia Assessment:                           - Prior to the procedure, a History and Physical                            was performed, and patient medications and                            allergies were reviewed. The patient's tolerance of                            previous anesthesia was also reviewed. The risks                            and benefits of the procedure and the sedation                            options and risks were discussed with the patient.                            All questions were answered, and informed consent                            was obtained. Prior Anticoagulants: The patient has                            taken no previous anticoagulant or antiplatelet                            agents. ASA Grade Assessment: II - A patient with                            mild systemic disease. After reviewing the risks  and benefits, the patient was deemed in                            satisfactory condition to undergo the procedure.                           After obtaining informed consent, the colonoscope                            was passed under direct vision. Throughout the                            procedure, the patient's blood pressure, pulse, and                            oxygen  saturations were monitored continuously. The                            0405 [id number]-[id number] Slim SB Colonoscope was introduced                            through the anus and advanced to the the terminal                            ileum. The colonoscopy was performed without                            difficulty. The patient tolerated the procedure                            well. The quality of the bowel preparation was                            good. The terminal ileum, ileocecal valve,                            appendiceal orifice, and rectum were photographed. Scope In: 8:19:37 AM Scope Out: 8:33:25 AM Scope Withdrawal Time: 0 hours 10 minutes 33 seconds  Total Procedure Duration: 0 hours 13 minutes 48 seconds  Findings:                 The terminal ileum appeared normal. Biopsies were                            taken with a cold forceps for histology. jar 1                           The right colon was normal appearing. Biopsies                            taken. jar 2.                           The left colon was normal except for VERY mild  erythema. Biospies taken. jar 3.                           The rectosigmoid was normal except for VERY mild                            erythema. Biopsies taken. jar 4.                           Internal hemorrhoids noted.                           The exam was otherwise without abnormality on                            direct and retroflexion views. Complications:            No immediate complications. Estimated blood loss:                            None. Estimated Blood Loss:     Estimated blood loss: none. Impression:               - The examined portion of the ileum was normal.                            Biopsied. jar 1                           - The right colon was normal appearing. Biopsies                            taken. jar 2.                           - The left colon was normal except for VERY mild                             erythema. Biospies taken. jar 3.                           - The rectosigmoid was normal except for VERY mild                            erythema. Biopsies taken. jar 4.                           - Internal hemorrhoids noted.                           - The examination was otherwise normal on direct                            and retroflexion views. Recommendation:           - Await pathology results.                           -  EGD now. Milus Banister, MD 09/29/2020 8:44:34 AM This report has been signed electronically.

## 2020-09-29 NOTE — Progress Notes (Signed)
Called to room to assist during endoscopic procedure.  Patient ID and intended procedure confirmed with present staff. Received instructions for my participation in the procedure from the performing physician.  

## 2020-10-01 ENCOUNTER — Telehealth: Payer: Self-pay | Admitting: *Deleted

## 2020-10-01 NOTE — Telephone Encounter (Signed)
  Follow up Call-  Call back number 09/29/2020 11/29/2018  Post procedure Call Back phone  # [phone]  Permission to leave phone message Yes Yes  Some recent data might be hidden     Patient questions:  Do you have a fever, pain , or abdominal swelling? No. Pain Score  0 *  Have you tolerated food without any problems? Yes.    Have you been able to return to your normal activities? Yes.    Do you have any questions about your discharge instructions: Diet   No. Medications  No. Follow up visit  No.  Do you have questions or concerns about your Care? No.  Actions: * If pain score is 4 or above: No action needed, pain <4.  Have you developed a fever since your procedure? no  2.   Have you had an respiratory symptoms (SOB or cough) since your procedure? no  3.   Have you tested positive for COVID 19 since your procedure no  4.   Have you had any family members/close contacts diagnosed with the COVID 19 since your procedure?  no   If yes to any of these questions please route to Joylene John, RN and Joella Prince, RN

## 2020-10-04 IMAGING — CT CT ABD-PELV W/ CM
2 of 5 series · 16 of 46 positions shown, 18 images · IV contrast (OMNIPAQUE 300)
Comparison: None.

CLINICAL DATA: Diffuse abdominal pain. Chronic diarrhea since
[REDACTED]. Uterine cancer 6 years ago with hysterectomy.

EXAM:
CT ABDOMEN AND PELVIS WITH CONTRAST
TECHNIQUE: Multidetector CT imaging of the abdomen and pelvis was performed
using the standard protocol following bolus administration of
intravenous contrast.
CONTRAST:  100mL OMNIPAQUE IOHEXOL 300 MG/ML  SOLN

[Series 2: abd/pel w · axial · 0.65mm/px · z∈[-449,-59]mm · 13 of 88 slices shown, 15 images]
[im 5/88  soft-tissue]
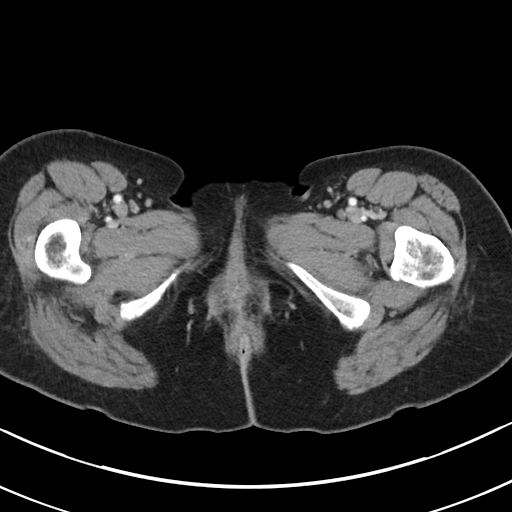
[im 5/88  bone]
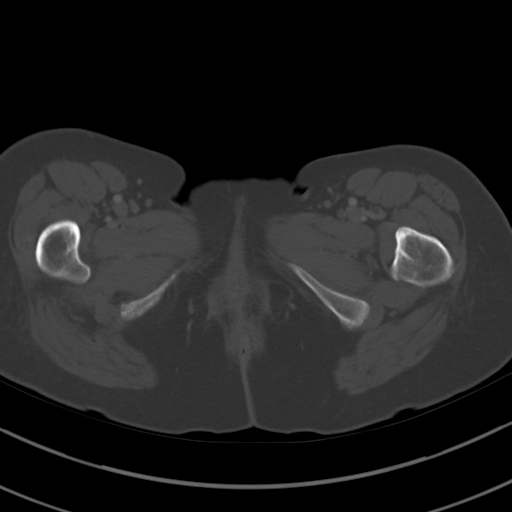
[im 14/88  soft-tissue]
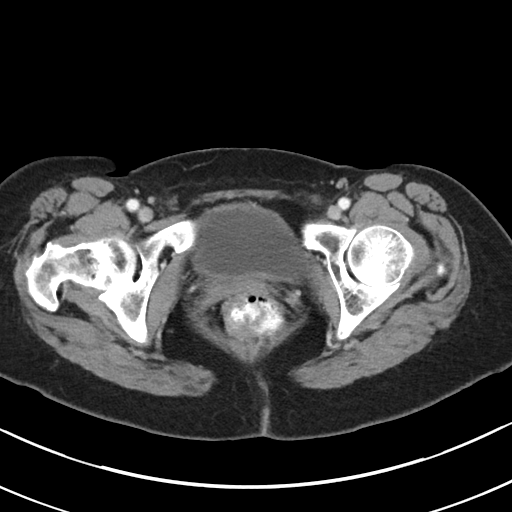
[im 19/88  soft-tissue]
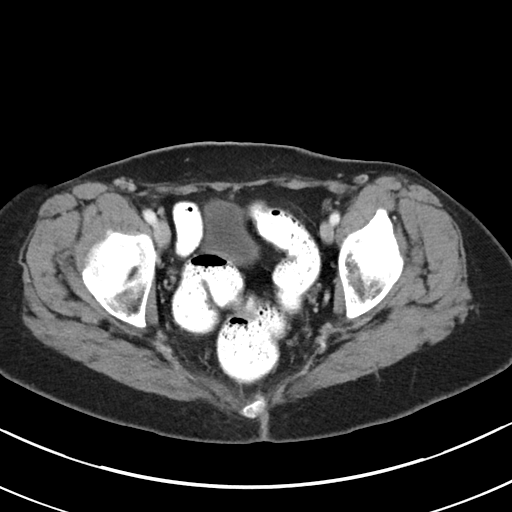
[im 23/88  soft-tissue]
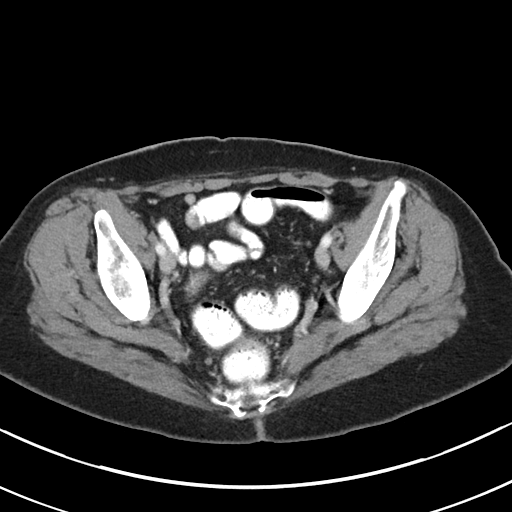
[im 33/88  soft-tissue]
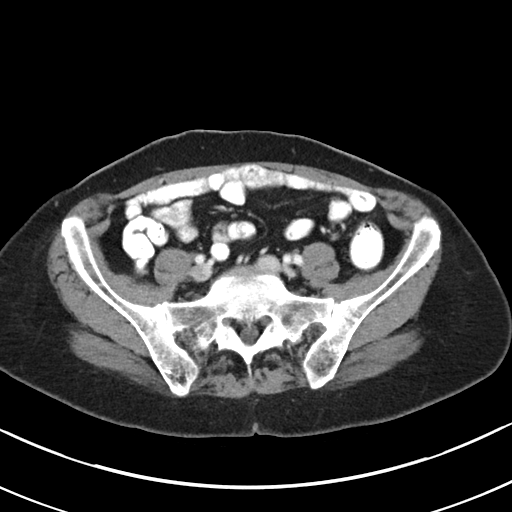
[im 37/88  soft-tissue]
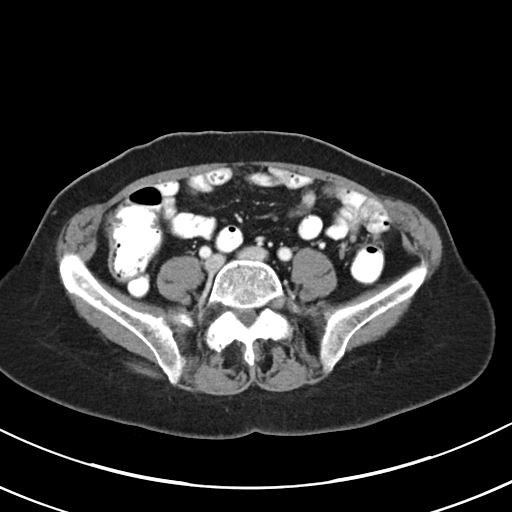
[im 46/88  soft-tissue]
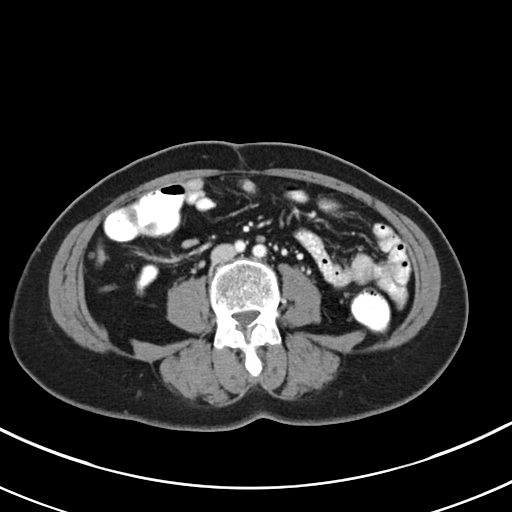
[im 51/88  soft-tissue]
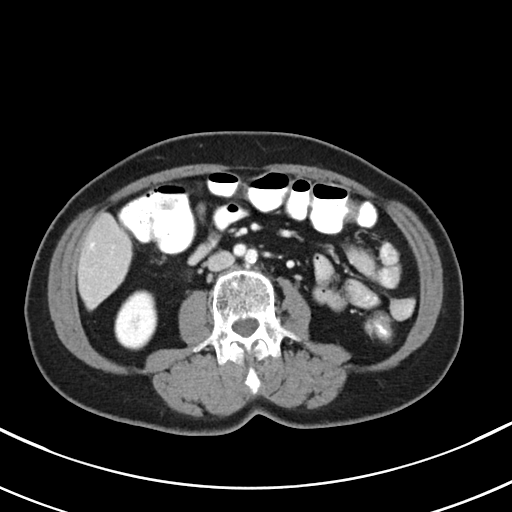
[im 55/88  soft-tissue]
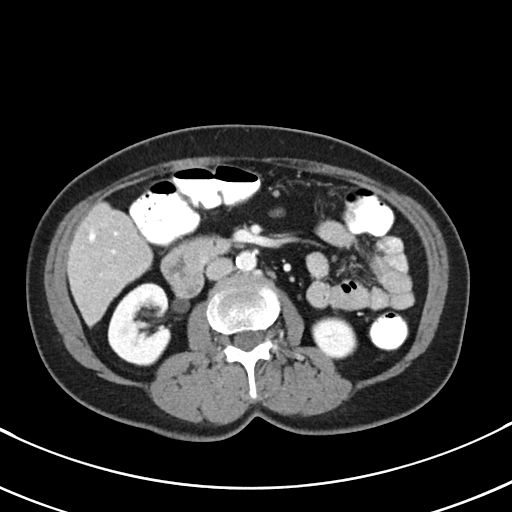
[im 55/88  bone]
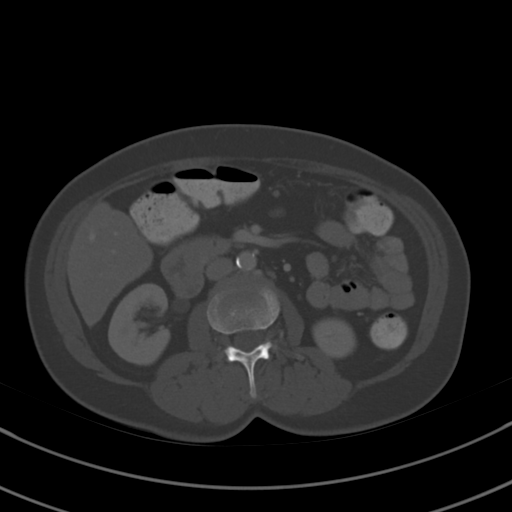
[im 65/88  soft-tissue]
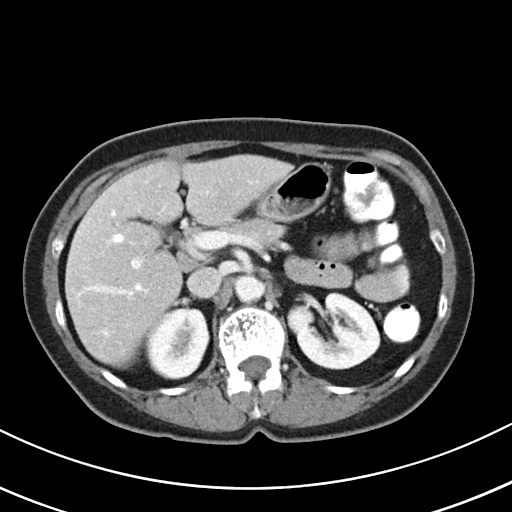
[im 69/88  soft-tissue]
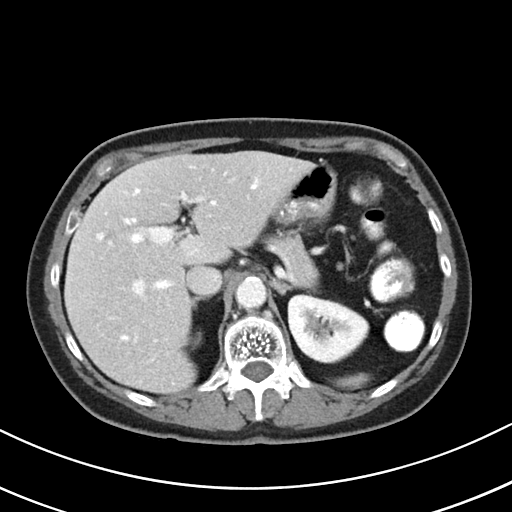
[im 74/88  soft-tissue]
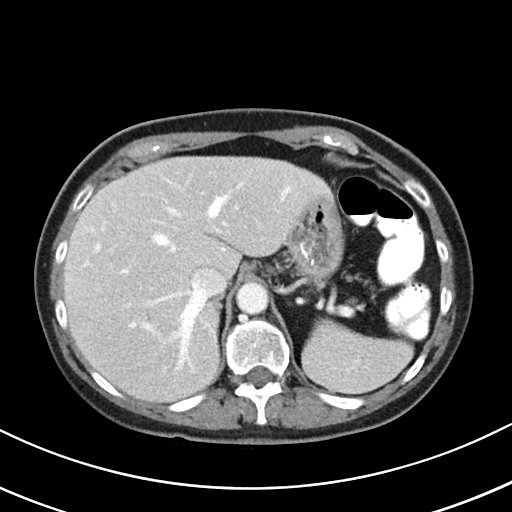
[im 83/88  soft-tissue]
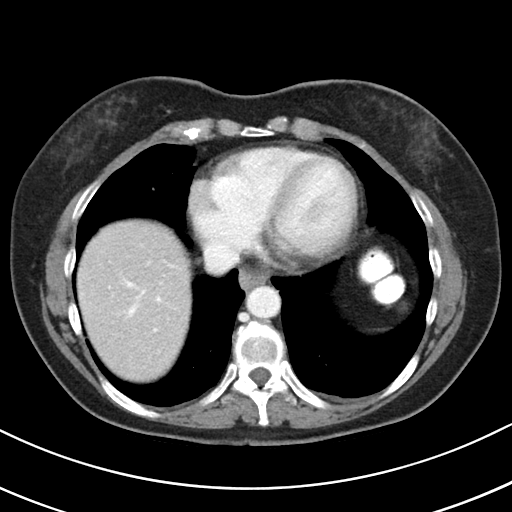

[Series 5: abd/pel w st · coronal · 0.63mm/px · 3 of 67 slices shown]
[im 23/67  soft-tissue]
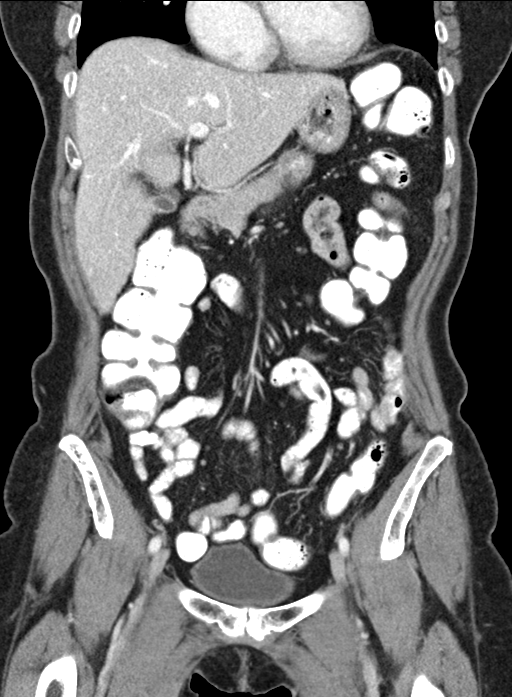
[im 30/67  soft-tissue]
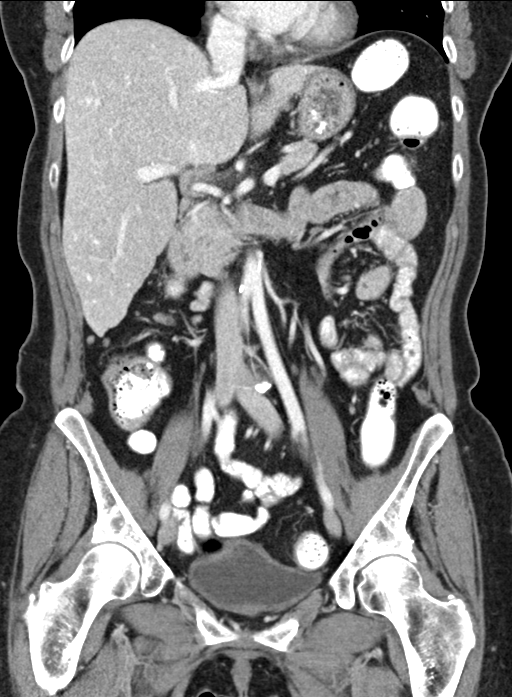
[im 37/67  soft-tissue]
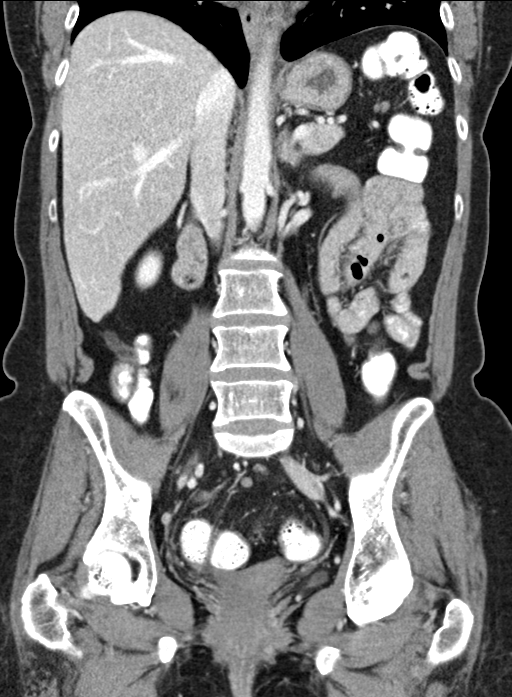

[16 of 46 positions shown; findings below may reference images not displayed]

FINDINGS: Lower chest: Clear lung bases. Normal heart size. Trace left pleural
fluid or thickening, likely physiologic.

Hepatobiliary: Hepatomegaly at 18.9 cm craniocaudal. Small
gallstones on the order of 4 mm and less. No acute cholecystitis or
biliary duct dilatation.

Pancreas: Normal, without mass or ductal dilatation.

Spleen: Normal in size, without focal abnormality.

Adrenals/Urinary Tract: Normal adrenal glands. Normal kidneys,
without hydronephrosis. Normal urinary bladder.

Stomach/Bowel: Normal stomach, without wall thickening. Normal
colon, appendix, and terminal ileum. Normal small bowel.

Vascular/Lymphatic: Aortic and branch vessel atherosclerosis. No
abdominopelvic adenopathy.

Reproductive: Hysterectomy.  No adnexal mass.

Other: No significant free fluid. Mild pelvic floor laxity. No
evidence of omental or peritoneal disease.

Musculoskeletal: Thoracolumbar vertebral hemangiomas.
IMPRESSION: 1. No acute process in the abdomen or pelvis.
2. Hepatomegaly.
3. Cholelithiasis.
4. Aortic Atherosclerosis ([id number]).

## 2020-10-05 DIAGNOSIS — K5190 Ulcerative colitis, unspecified, without complications: Secondary | ICD-10-CM | POA: Insufficient documentation

## 2020-10-05 NOTE — Progress Notes (Signed)
MEDICARE ANNUAL WELLNESS VISIT AND FOLLOW UP  Assessment:   Dorla was seen today for medicare wellness.  Diagnoses and all orders for this visit:  Welcome to Medicare preventive visit Due annually  Health maintenance reviewed  Primary hypertension Continue medication Monitor blood pressure at home; call if consistently over 130/80 Continue DASH diet.   Reminder to go to the ER if any CP, SOB, nausea, dizziness, severe HA, changes vision/speech, left arm numbness and tingling and jaw pain. -     CBC with Differential/Platelet -     COMPLETE METABOLIC PANEL WITH GFR -     Magnesium  Hyperlipidemia, mixed Continue medications for LDL goal <100 Continue low cholesterol diet and exercise.  Check lipid panel.  -     Lipid panel -     TSH  Other abnormal glucose (hx of prediabetes) Recent A1Cs at goal Discussed diet/exercise, weight management  Defer A1C; check CMP  Vitamin D deficiency Continue vitamin D supplement; check annually and PRN  Medication management CBC, CMP/GFR, magnesium  Ulcerative colitis without complications, unspecified location [facility]) Dr. [name] is managing, pending colonoscopy pathology results  Need for prophylactic vaccination against Streptococcus pneumoniae (pneumococcus) Administered today without complication  -     Pneumococcal conjugate vaccine 20-valent (Prevnar 20)  Over 40 minutes of exam, counseling, chart review and critical decision making was performed Future Appointments  Date Time Provider [place]  05/20/2021 11:00 AM [name], MD [facility] None  10/07/2021 11:00 AM [name], NP [facility] None     Plan:   During the course of the visit the patient was educated and counseled about appropriate screening and preventive services including:   Pneumococcal vaccine  Prevnar 13 Influenza vaccine Td vaccine Screening electrocardiogram Bone densitometry screening Colorectal cancer screening Diabetes  screening Glaucoma screening Nutrition counseling  Advanced directives: requested   Subjective:  Elani Delph is a 65 y.o. female who presents for Medicare Annual Wellness Visit and 3 month follow up. She has Hypertension; Hyperlipidemia, mixed; Other abnormal glucose (hx of prediabetes); Vitamin D deficiency; Medication management; Diarrhea; and Ulcerative colitis ([place]) on their problem list.  Dr. [name] diagnosed with likely mild ulcerative colitis following workup for chronic diarrhea in 11/2018, had recent EGD and colonoscopy 09/29/2020 pathology results pending. She is doing well on lower dose Entocort at 1 pill once daily and balsalazide 3 pills 3 times daily and 1 Imodium pill once daily. Cost barrier with budesonide.   BMI is Body mass index is 23.24 kg/m., she has been working on diet and exercise. Wt Readings from Last 3 Encounters:  10/06/20 123 lb (55.8 kg)  09/29/20 121 lb (54.9 kg)  07/14/20 121 lb (54.9 kg)   Her blood pressure has been controlled at home, today their BP is BP: 128/74 She does workout. She denies chest pain, shortness of breath, dizziness.   She is on cholesterol medication  (simvastatin 20 mg daily) and denies myalgias. Her cholesterol is at goal. The cholesterol last visit was:   Lab Results  Component Value Date   CHOL 157 05/17/2020   HDL 41 (L) 05/17/2020   LDLCALC 84 05/17/2020   TRIG 218 (H) 05/17/2020   CHOLHDL 3.8 05/17/2020    She has been working on diet and exercise for glucose management, and denies increased appetite, nausea, paresthesia of the feet, polydipsia, and polyuria.  Last A1C in the office was:  Lab Results  Component Value Date   HGBA1C 5.5 05/17/2020   Last GFR: Lab Results  Component  Value Date   GFRNONAA 90 05/17/2020   Patient is on Vitamin D supplement.   Lab Results  Component Value Date   VD25OH 101 (H) 05/17/2020      Medication Review: Current Outpatient Medications on File Prior to Visit  Medication Sig  Dispense Refill   aspirin EC 81 MG tablet Take 81 mg by mouth daily.     balsalazide (COLAZAL) 750 MG capsule TAKE 3 CAPSULES BY MOUTH THREE TIMES DAILY 270 capsule 3   CALCIUM PO Take 600 mg by mouth daily.     cetirizine (ZYRTEC) 10 MG tablet Take 10 mg by mouth daily.     Cholecalciferol (VITAMIN D PO) Take 5,000 Int'l Units by mouth daily.     Cyanocobalamin (VITAMIN B-12) 5000 MCG SUBL Place 5,000 mcg under the tongue once a week.     enalapril (VASOTEC) 20 MG tablet Take 1 tablet  Daily  for BP 90 tablet 3   Flaxseed Oil (LINSEED OIL) OIL Take 1,200 mg by mouth 2 (two) times daily.      hyoscyamine (LEVSIN SL) 0.125 MG SL tablet DISSOLVE 1 TABLET IN MOUTH EVERY 4 HOURS AS NEEDED FOR CRAMPS AND FOR NAUSEA 90 tablet 0   loperamide (IMODIUM A-D) 2 MG tablet Take 2 mg by mouth 4 (four) times daily as needed for diarrhea or loose stools.     Multiple Vitamin (MULTIVITAMIN) capsule Take 1 capsule by mouth daily.     Probiotic Product (PROBIOTIC DAILY PO) Take by mouth daily.     simvastatin (ZOCOR) 20 MG tablet Take 1 tablet at Bedtime for Cholesterol 90 tablet 3   No current facility-administered medications on file prior to visit.    No Known Allergies  Current Problems (verified) Patient Active Problem List   Diagnosis Date Noted   Ulcerative colitis ([place]) 10/05/2020   Diarrhea 08/19/2018   Medication management 04/24/2014   Hypertension    Hyperlipidemia, mixed    Other abnormal glucose (hx of prediabetes)    Vitamin D deficiency     Screening Tests Immunization History  Administered Date(s) Administered   Influenza Inj Mdck Quad With Preservative 12/03/2017, 11/13/2018   Janssen (J&J) SARS-COV-2 Vaccination 04/23/2019   PPD Test 09/30/2013, 05/13/2019, 05/17/2020   Pneumococcal-Unspecified 08/27/2008   Tdap 07/18/2007   Zoster, Live 12/29/2015    Preventative care: Last colonoscopy: 09/29/2020, Dr. [name], pending pathology - follow up to be determined Last EGD:  09/29/2020  Last mammogram: 11/13/2019, has phone number to schedule  Last pap smear/pelvic exam: s/p TAH, done    DEXA: reports had normal in 2019 at Dr. Helane Rima, will plan in 5-10 years  Prior vaccinations: TD or Tdap: 2009, will get with next cut  Influenza: 2020, recommended to get in Oct  Pneumo 20: today Shingles/Zostavax: 2017, hold off shingrix, cost  Covid 19: repots J&J x 2, send dates, encouraged new booster  Names of Other Physician/Practitioners you currently use: 1. [facility] here for primary care 2. Dr. Kathlen Mody, eye doctor, last visit 2022, glasses, mild cataracts  3. overdue, dentist, last visit remote, encouraged  Patient Care Team: [name], MD as PCP - General (Internal Medicine) Inda Castle, MD (Inactive) as Consulting Physician (Gastroenterology)  SURGICAL HISTORY She  has a past surgical history that includes Cesarean section; Knee arthroscopy (Left, 1972); Tonsillectomy and adenoidectomy; Stapedectomy (Left, 2003); right leg repair (1994); Colonoscopy (03/17/2005); and Robotic assisted total hysterectomy (2015). FAMILY HISTORY Her family history includes Breast cancer in her paternal  aunt; Cancer in her father, maternal aunt, maternal uncle, and mother; Colon cancer in her maternal grandfather and maternal grandmother; Colon polyps in her mother; Diabetes in her father; Heart disease in her paternal grandmother; Hypertension in her father and mother; Stomach cancer in her mother; Stroke in her father; Uterine cancer in her sister. SOCIAL HISTORY She  reports that she has never smoked. She has never used smokeless tobacco. She reports that she does not drink alcohol and does not use drugs.   MEDICARE WELLNESS OBJECTIVES: Physical activity: Current Exercise Habits: Home exercise routine, Type of exercise: treadmill;strength training/weights, Time (Minutes): 30, Frequency (Times/Week): 5, Weekly Exercise  (Minutes/Week): 150, Intensity: Mild, Exercise limited by: None identified Cardiac risk factors: Cardiac Risk Factors include: advanced age (>10mn, >>80women);dyslipidemia;hypertension Depression/mood screen:   [facility]  Decreased Interest 0  Down, Depressed, Hopeless 0  PHQ - 2 Score 0    ADLs:  In your present state of health, do you have any difficulty performing the following activities: 10/06/2020  Hearing? N  Vision? N  Difficulty concentrating or making decisions? N  Walking or climbing stairs? N  Dressing or bathing? N  Doing errands, shopping? N  Some recent data might be hidden     Cognitive Testing  Alert? Yes  Normal Appearance?Yes  Oriented to person? Yes  Place? Yes   Time? Yes  Recall of three objects?  Yes  Can perform simple calculations? Yes  Displays appropriate judgment?Yes  Can read the correct time from a watch face?Yes  EOL planning: Does Patient Have a Medical Advance Directive?: Yes Type of Advance Directive: Healthcare Power of Attorney, Living will Does patient want to make changes to medical advance directive?: No - Patient declined Copy of [place]?: No - copy requested   Review of Systems  Constitutional:  Negative for malaise/fatigue and weight loss.  HENT:  Negative for hearing loss and tinnitus.   Eyes:  Negative for blurred vision and double vision.  Respiratory:  Negative for cough, shortness of breath and wheezing.   Cardiovascular:  Negative for chest pain, palpitations, orthopnea, claudication and leg swelling.  Gastrointestinal:  Positive for diarrhea (chronic, GI working up, colitis). Negative for abdominal pain, blood in stool, constipation, heartburn, melena, nausea and vomiting.  Genitourinary: Negative.   Musculoskeletal:  Negative for joint pain and myalgias.  Skin:  Negative for rash.  Neurological:  Negative for dizziness, tingling, sensory change, weakness and headaches.   Endo/Heme/Allergies:  Negative for polydipsia.  Psychiatric/Behavioral: Negative.    All other systems reviewed and are negative.   Objective:     Today's Vitals   10/06/20 1019  BP: 128/74  Pulse: 80  Temp: 97.7 F (36.5 C)  SpO2: 99%  Weight: 123 lb (55.8 kg)   Body mass index is 23.24 kg/m.  General appearance: alert, no distress, WD/WN, female HEENT: normocephalic, sclerae anicteric, TMs pearly, nares patent, no discharge or erythema, pharynx normal Oral cavity: MMM, no lesions Neck: supple, no lymphadenopathy, no thyromegaly, no masses Heart: RRR, normal S1, S2, no murmurs Lungs: CTA bilaterally, no wheezes, rhonchi, or rales Abdomen: +bs, soft, non tender, non distended, no masses, no hepatomegaly, no splenomegaly Musculoskeletal: nontender, no swelling, no obvious deformity Extremities: no edema, no cyanosis, no clubbing Pulses: 2+ symmetric, upper and lower extremities, normal cap refill Neurological: alert, oriented x 3, CN2-12 intact, strength normal upper extremities and lower extremities, sensation normal throughout, DTRs 2+ throughout, no cerebellar signs, gait normal Psychiatric:  normal affect, behavior normal, pleasant   EKG: normal in 05/2020, defer AAA [place]: low risk, declines  Medicare Attestation I have personally reviewed: The patient's medical and social history Their use of alcohol, tobacco or illicit drugs Their current medications and supplements The patient's functional ability including ADLs,fall risks, home safety risks, cognitive, and hearing and visual impairment Diet and physical activities Evidence for depression or mood disorders  The patient's weight, height, BMI, and visual acuity have been recorded in the chart.  I have made referrals, counseling, and provided education to the patient based on review of the above and I have provided the patient with a written personalized care plan for preventive services.     Izora Ribas,  NP   10/06/2020

## 2020-10-06 ENCOUNTER — Encounter: Payer: Self-pay | Admitting: Adult Health

## 2020-10-06 ENCOUNTER — Ambulatory Visit (INDEPENDENT_AMBULATORY_CARE_PROVIDER_SITE_OTHER): Payer: Medicare Other | Admitting: Adult Health

## 2020-10-06 ENCOUNTER — Other Ambulatory Visit: Payer: Self-pay

## 2020-10-06 VITALS — BP 128/74 | HR 80 | Temp 97.7°F | Wt 123.0 lb

## 2020-10-06 DIAGNOSIS — R7309 Other abnormal glucose: Secondary | ICD-10-CM

## 2020-10-06 DIAGNOSIS — K5190 Ulcerative colitis, unspecified, without complications: Secondary | ICD-10-CM

## 2020-10-06 DIAGNOSIS — Z23 Encounter for immunization: Secondary | ICD-10-CM | POA: Diagnosis not present

## 2020-10-06 DIAGNOSIS — Z0001 Encounter for general adult medical examination with abnormal findings: Secondary | ICD-10-CM | POA: Diagnosis not present

## 2020-10-06 DIAGNOSIS — R197 Diarrhea, unspecified: Secondary | ICD-10-CM

## 2020-10-06 DIAGNOSIS — R6889 Other general symptoms and signs: Secondary | ICD-10-CM

## 2020-10-06 DIAGNOSIS — E559 Vitamin D deficiency, unspecified: Secondary | ICD-10-CM

## 2020-10-06 DIAGNOSIS — I10 Essential (primary) hypertension: Secondary | ICD-10-CM

## 2020-10-06 DIAGNOSIS — Z79899 Other long term (current) drug therapy: Secondary | ICD-10-CM

## 2020-10-06 DIAGNOSIS — E782 Mixed hyperlipidemia: Secondary | ICD-10-CM

## 2020-10-06 DIAGNOSIS — Z0000 Encounter for general adult medical examination without abnormal findings: Secondary | ICD-10-CM

## 2020-10-06 NOTE — Patient Instructions (Addendum)
Vicki Hansen , Thank you for taking time to come for your Medicare Wellness Visit. I appreciate your ongoing commitment to your health goals. Please review the following plan we discussed and let me know if I can assist you in the future.   These are the goals we discussed:  Goals      Blood Pressure < 130/80        This is a list of the screening recommended for you and due dates:  Health Maintenance  Topic Date Due   COVID-19 Vaccine (2 - Janssen risk series) 05/21/2019   Flu Shot  10/16/2020*   Tetanus Vaccine  10/06/2021*   DEXA scan (bone density measurement)  10/07/2023*   Zoster (Shingles) Vaccine (1 of 2) 10/07/2023*   Mammogram  11/12/2021   Colon Cancer Screening  09/30/2030   Hepatitis C Screening: USPSTF Recommendation to screen - Ages 18-79 yo.  Completed   HIV Screening  Completed   HPV Vaccine  Aged Out   Pap Smear  Discontinued  *Topic was postponed. The date shown is not the original due date.       Pneumococcal Conjugate Vaccine (Prevnar 20) Suspension for Injection What is this medication? PNEUMOCOCCAL VACCINE (NEU mo KOK al vak SEEN) is a vaccine. It prevents pneumococcus bacterial infections. These bacteria can cause serious infections like pneumonia, meningitis, and blood infections. This vaccine will not treat an infection and will not cause infection. This vaccine is recommended for adults 18 years and older. This medicine may be used for other purposes; ask your health care provider or pharmacist if you have questions. COMMON BRAND NAME(S): Prevnar 20 What should I tell my care team before I take this medication? They need to know if you have any of these conditions: bleeding disorder fever immune system problems an unusual or allergic reaction to pneumococcal vaccine, diphtheria toxoid, other vaccines, other medicines, foods, dyes, or preservatives pregnant or trying to get pregnant breast-feeding How should I use this medication? This vaccine is  injected into a muscle. It is given by a health care provider. A copy of Vaccine Information Statements will be given before each vaccination. Be sure to read this information carefully each time. This sheet may change often. Talk to your health care provider about the use of this medicine in children. Special care may be needed. Overdosage: If you think you have taken too much of this medicine contact a poison control center or emergency room at once. NOTE: This medicine is only for you. Do not share this medicine with others. What if I miss a dose? This does not apply. This medicine is not for regular use. What may interact with this medication? medicines for cancer chemotherapy medicines that suppress your immune function steroid medicines like prednisone or cortisone This list may not describe all possible interactions. Give your health care provider a list of all the medicines, herbs, non-prescription drugs, or dietary supplements you use. Also tell them if you smoke, drink alcohol, or use illegal drugs. Some items may interact with your medicine. What should I watch for while using this medication? Mild fever and pain should go away in 3 days or less. Report any unusual symptoms to your health care provider. What side effects may I notice from receiving this medication? Side effects that you should report to your doctor or health care professional as soon as possible: allergic reactions (skin rash, itching or hives; swelling of the face, lips, or tongue) confusion fast, irregular heartbeat fever over 102  degrees F muscle weakness seizures trouble breathing unusual bruising or bleeding Side effects that usually do not require medical attention (report to your doctor or health care professional if they continue or are bothersome): fever of 102 degrees F or less headache joint pain muscle cramps, pain pain, tender at site where injected This list may not describe all possible side  effects. Call your doctor for medical advice about side effects. You may report side effects to FDA at 1-800-FDA-1088. Where should I keep my medication? This vaccine is only given by a health care provider. It will not be stored at home. NOTE: This sheet is a summary. It may not cover all possible information. If you have questions about this medicine, talk to your doctor, pharmacist, or health care provider.  2022 Elsevier/Gold Standard (2019-09-11 16:14:35)

## 2020-10-07 LAB — CBC WITH DIFFERENTIAL/PLATELET
Absolute Monocytes: 446 cells/uL (ref 200–950)
Basophils Absolute: 31 cells/uL (ref 0–200)
Basophils Relative: 0.5 %
Eosinophils Absolute: 37 cells/uL (ref 15–500)
Eosinophils Relative: 0.6 %
HCT: 44.2 % (ref 35.0–45.0)
Hemoglobin: 14.6 g/dL (ref 11.7–15.5)
Lymphs Abs: 1804 cells/uL (ref 850–3900)
MCH: 30.1 pg (ref 27.0–33.0)
MCHC: 33 g/dL (ref 32.0–36.0)
MCV: 91.1 fL (ref 80.0–100.0)
MPV: 10.4 fL (ref 7.5–12.5)
Monocytes Relative: 7.2 %
Neutro Abs: 3881 cells/uL (ref 1500–7800)
Neutrophils Relative %: 62.6 %
Platelets: 275 10*3/uL (ref 140–400)
RBC: 4.85 10*6/uL (ref 3.80–5.10)
RDW: 12.3 % (ref 11.0–15.0)
Total Lymphocyte: 29.1 %
WBC: 6.2 10*3/uL (ref 3.8–10.8)

## 2020-10-07 LAB — COMPLETE METABOLIC PANEL WITH GFR
AG Ratio: 1.8 (calc) (ref 1.0–2.5)
ALT: 10 U/L (ref 6–29)
AST: 21 U/L (ref 10–35)
Albumin: 4.5 g/dL (ref 3.6–5.1)
Alkaline phosphatase (APISO): 95 U/L (ref 37–153)
BUN: 15 mg/dL (ref 7–25)
CO2: 29 mmol/L (ref 20–32)
Calcium: 10.2 mg/dL (ref 8.6–10.4)
Chloride: 105 mmol/L (ref 98–110)
Creat: 0.59 mg/dL (ref 0.50–1.05)
Globulin: 2.5 g/dL (calc) (ref 1.9–3.7)
Glucose, Bld: 92 mg/dL (ref 65–99)
Potassium: 4 mmol/L (ref 3.5–5.3)
Sodium: 142 mmol/L (ref 135–146)
Total Bilirubin: 0.3 mg/dL (ref 0.2–1.2)
Total Protein: 7 g/dL (ref 6.1–8.1)
eGFR: 100 mL/min/{1.73_m2} (ref >=60)

## 2020-10-07 LAB — LIPID PANEL
Cholesterol: 173 mg/dL (ref <200)
HDL: 45 mg/dL — ABNORMAL LOW (ref >=50)
LDL Cholesterol (Calc): 97 mg/dL (calc)
Non-HDL Cholesterol (Calc): 128 mg/dL (calc) (ref <130)
Total CHOL/HDL Ratio: 3.8 (calc) (ref <5.0)
Triglycerides: 224 mg/dL — ABNORMAL HIGH (ref <150)

## 2020-10-07 LAB — MAGNESIUM: Magnesium: 2.3 mg/dL (ref 1.5–2.5)

## 2020-10-07 LAB — TSH: TSH: 0.63 mIU/L (ref 0.40–4.50)

## 2020-10-08 ENCOUNTER — Other Ambulatory Visit: Payer: Self-pay

## 2020-10-08 MED ORDER — CHOLESTYRAMINE 4 G PO PACK: 4.0000 g | PACK | Freq: Two times a day (BID) | ORAL | 3 refills | Status: DC

## 2020-10-08 MED ORDER — BUDESONIDE 3 MG PO CPEP: 9.0000 mg | ORAL_CAPSULE | Freq: Every day | ORAL | 6 refills | Status: DC

## 2020-10-13 ENCOUNTER — Telehealth: Payer: Self-pay | Admitting: Gastroenterology

## 2020-10-13 NOTE — Telephone Encounter (Signed)
Inbound call from patient states she picked up prescription and unsure why it is needed. Sodium/Potassium/Magnesium Oral solution

## 2020-10-14 NOTE — Telephone Encounter (Signed)
Returned  call to patient and she states when she went to pharmacy to pick up medications at pharmacy she was given Suprep.  Explained to patient that was the original colonoscopy prep that was sent in, and it was a mistake on the pharmacies end for giving it to her.  Patient advised she should have picked up Questran.  Patient verbalized understanding.

## 2020-11-05 ENCOUNTER — Other Ambulatory Visit: Payer: Self-pay

## 2020-11-05 ENCOUNTER — Ambulatory Visit (INDEPENDENT_AMBULATORY_CARE_PROVIDER_SITE_OTHER): Payer: Medicare Other

## 2020-11-05 VITALS — Temp 97.1°F

## 2020-11-05 DIAGNOSIS — Z23 Encounter for immunization: Secondary | ICD-10-CM | POA: Diagnosis not present

## 2020-11-22 ENCOUNTER — Encounter: Payer: Self-pay | Admitting: Internal Medicine

## 2020-12-15 ENCOUNTER — Encounter: Payer: Self-pay | Admitting: Gastroenterology

## 2020-12-15 ENCOUNTER — Ambulatory Visit (INDEPENDENT_AMBULATORY_CARE_PROVIDER_SITE_OTHER): Payer: Medicare Other | Admitting: Gastroenterology

## 2020-12-15 VITALS — BP 164/92 | HR 76 | Ht 61.0 in | Wt 130.0 lb

## 2020-12-15 DIAGNOSIS — K52839 Microscopic colitis, unspecified: Secondary | ICD-10-CM

## 2020-12-15 MED ORDER — BUDESONIDE 3 MG PO CPEP: ORAL_CAPSULE | ORAL | 6 refills | Status: DC

## 2020-12-15 NOTE — Patient Instructions (Addendum)
If you are age 65 or older, your body mass index should be between 23-30. Your Body mass index is 24.56 kg/m. If this is out of the aforementioned range listed, please consider follow up with your Primary Care Provider.   ________________________________________________________ The [facility] providers would like to encourage you to use [facility] to communicate with providers for non-urgent requests or questions.  Due to long hold times on the telephone, sending your provider a message by [facility] may be a faster and more efficient way to get a response.  Please allow 48 business hours for a response.  Please remember that this is for non-urgent requests.  _______________________________________________________  Decrease Budesonide to 47m (2 capsules) daily for 6 weeks, then 333m(1 capsule) once daily.  You are scheduled to follow up on 02-16-21 at 8:50am.  Thank you for entrusting me with your care and [facility]

## 2020-12-15 NOTE — Progress Notes (Signed)
Review of pertinent gastrointestinal problems: 1.  Routine risk for colon cancer.  Colonoscopy April 2018 showed no polyps.  Internal hemorrhoids were noted.  She was recommended to have repeat colonoscopy at 10-year interval for colon cancer screening.  Colonoscopy 2008 was normal. 2.  Chronic diarrhea led to colonoscopy November 2020.  Terminal ileum was normal.  There was mild erythema and granularity throughout the colon.  Biopsies were taken.  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  Budesonide was too expensive.  Imodium caused cramping in nausea but was quite effective even at low doses.  Tried to prescribe mesalamine January 2021 however ended up with sulfasalazine 1gram TID due to her insurance restrictions.  Prednisone trial with good partial response.  Again we tried prescribing Entocort 3 mg pills 3 pills once daily.  Very good response as of November 2021.  Switched to lower dose Entocort at 1 pill once daily and started full-strength balsalazide 3 pills 3 times daily and 1 Imodium pill once daily.  Colonoscopy 09/2020 showed normal terminal ileum which was biopsied, right colon was normal-appearing and was biopsied, left colon was very mildly erythematous and was biopsied.  All colon biopsies showed "collagenous colitis".  I recommended she continue the budesonide 9 mg daily and also start cholestyramine 4 g twice daily, also stop balsalazide completely. 3.  Right-sided abdominal pains June 2022 led to testing including CT scan abdomen pelvis with IV and oral contrast which showed gallstones but otherwise was s essentially normal.,  EGD 09/2020 showed nonspecific distal gastritis which was biopsied.  Normal duodenum was biopsied.  These biopsies were all normal.  Colonoscopy 09/2020 showed normal terminal ileum which was biopsied, right colon was normal-appearing and was biopsied, left colon was very mildly erythematous and  was biopsied.  All colon biopsies showed "collagenous colitis".  I recommended she continue the budesonide 9 mg daily and also start cholestyramine 4 g twice daily, also stop balsalazide completely.   HPI: This is a very pleasant 65 year old woman  Her weight is up 9 pounds since her last office visit here in June 2022, same scale  I last saw her at the time of colonoscopy and upper endoscopy about 2 months ago.  See those results summarized above.  She has been quite good about taking her budesonide 3 pills every morning and she has been taking cholestyramine 4 g once daily.  The cholestyramine makes her a bit nauseous and so she is only taking it once daily.  She has noticed significant improvement in her profuse watery diarrhea.  Prior to this she was moving her bowels 10-20 times a day liquid only.  The above bowel regimen has her with solid stools to sometimes 3 times a day.  ROS: complete GI ROS as described in HPI, all other review negative.  Constitutional:  No unintentional weight loss   Past Medical History:  Diagnosis Date   Allergy    Arthritis    BACK    Cancer ([place])    UTERINE    Cataract    MILD   GERD (gastroesophageal reflux disease)    Hyperlipidemia    Hypertension    Iritis    HLA B27 +   Prediabetes    S/P ORIF (open reduction internal fixation) fracture 1994   Right tib/fib   Thyroiditis, subacute    Vitamin D deficiency     Past Surgical History:  Procedure Laterality Date   CESAREAN SECTION  COLONOSCOPY  03/17/2005   HEMS    KNEE ARTHROSCOPY Left 1972   right leg repair  1994   rod    ROBOTIC ASSISTED TOTAL HYSTERECTOMY  2015   pt. states she had robot assisted hysterectomy and uterus was removed abdominally due to scar tissue.  she also states that both tubes and ovaries were removed.   STAPEDECTOMY Left 2003   TONSILLECTOMY AND ADENOIDECTOMY      Current Outpatient Medications  Medication Sig Dispense Refill   aspirin EC 81 MG  tablet Take 81 mg by mouth daily.     budesonide (ENTOCORT EC) 3 MG 24 hr capsule Take 3 capsules (9 mg total) by mouth daily. 90 capsule 6   CALCIUM PO Take 600 mg by mouth daily.     cetirizine (ZYRTEC) 10 MG tablet Take 10 mg by mouth daily.     Cholecalciferol (VITAMIN D PO) Take 5,000 Int'l Units by mouth daily.     Cyanocobalamin (VITAMIN B-12) 5000 MCG SUBL Place 5,000 mcg under the tongue once a week.     enalapril (VASOTEC) 20 MG tablet Take 1 tablet  Daily  for BP 90 tablet 3   Flaxseed Oil (LINSEED OIL) OIL Take 1,200 mg by mouth 2 (two) times daily.      hyoscyamine (LEVSIN SL) 0.125 MG SL tablet DISSOLVE 1 TABLET IN MOUTH EVERY 4 HOURS AS NEEDED FOR CRAMPS AND FOR NAUSEA 90 tablet 0   loperamide (IMODIUM A-D) 2 MG tablet Take 2 mg by mouth 4 (four) times daily as needed for diarrhea or loose stools.     Multiple Vitamin (MULTIVITAMIN) capsule Take 1 capsule by mouth daily.     Probiotic Product (PROBIOTIC DAILY PO) Take by mouth daily.     simvastatin (ZOCOR) 20 MG tablet Take 1 tablet at Bedtime for Cholesterol 90 tablet 3   cholestyramine (QUESTRAN) 4 g packet Take 1 packet (4 g total) by mouth 2 (two) times daily. (Patient taking differently: Take 4 g by mouth daily.) 60 packet 3   No current facility-administered medications for this visit.    Allergies as of 12/15/2020   (No Known Allergies)    Family History  Problem Relation Age of Onset   Cancer Mother        uterine, kidney   Hypertension Mother    Colon polyps Mother    Stomach cancer Mother    Diabetes Father    Hypertension Father    Stroke Father    Cancer Father        kidney   Uterine cancer Sister    Colon cancer Maternal Grandmother    Colon cancer Maternal Grandfather    Heart disease Paternal Grandmother    Breast cancer Paternal Aunt    Cancer Maternal Aunt        stomach, colon, bladder   Cancer Maternal Uncle        stomach, colon, bladder   Esophageal cancer Neg Hx    Rectal cancer Neg  Hx     Social History   Socioeconomic History   Marital status: Married    Spouse name: Not on file   Number of children: 1   Years of education: Not on file   Highest education level: Not on file  Occupational History   Not on file  Tobacco Use   Smoking status: Never   Smokeless tobacco: Never  Vaping Use   Vaping Use: Never used  Substance and Sexual Activity   Alcohol use: No  Drug use: No   Sexual activity: Yes    Partners: Male    Birth control/protection: Post-menopausal, Surgical  Other Topics Concern   Not on file  Social History Narrative   Not on file   Social Determinants of Health   Financial Resource Strain: Not on file  Food Insecurity: Not on file  Transportation Needs: Not on file  Physical Activity: Not on file  Stress: Not on file  Social Connections: Not on file  Intimate Partner Violence: Not on file     Physical Exam: BP (!) 164/92   Pulse 76   Ht 5' 1"  (1.549 m)   Wt 130 lb (59 kg)   BMI 24.56 kg/m  Constitutional: generally well-appearing Psychiatric: alert and oriented x3 Abdomen: soft, nontender, nondistended, no obvious ascites, no peritoneal signs, normal bowel sounds No peripheral edema noted in lower extremities  Assessment and plan: 65 y.o. female with collagenous colitis  Her diarrhea is much much improved on her current regimen which is budesonide 3 pills every morning and cholestyramine 1 dose once daily.  I am going to have her back down on the budesonide quite slowly, she will start taking 2 pills once daily tomorrow and in 6 weeks she will back down to 1 pill once daily.  She will return to see me in 2 months and sooner if any problems.  Please see the "Patient Instructions" section for addition details about the plan.  Owens Loffler, MD [facility] 12/15/2020, 8:55 AM   Total time on date of encounter was 25 minutes (this included time spent preparing to see the patient reviewing records; obtaining  and/or reviewing separately obtained history; performing a medically appropriate exam and/or evaluation; counseling and educating the patient and family if present; ordering medications, tests or procedures if applicable; and documenting clinical information in the health record).

## 2021-02-16 ENCOUNTER — Ambulatory Visit (INDEPENDENT_AMBULATORY_CARE_PROVIDER_SITE_OTHER): Payer: Medicare Other | Admitting: Gastroenterology

## 2021-02-16 ENCOUNTER — Encounter: Payer: Self-pay | Admitting: Gastroenterology

## 2021-02-16 VITALS — BP 150/80 | HR 82 | Ht 61.0 in | Wt 132.0 lb

## 2021-02-16 DIAGNOSIS — K52839 Microscopic colitis, unspecified: Secondary | ICD-10-CM

## 2021-02-16 NOTE — Patient Instructions (Signed)
If you are age 66 or older, your body mass index should be between 23-30. Your Body mass index is 24.94 kg/m. If this is out of the aforementioned range listed, please consider follow up with your Primary Care Provider. ________________________________________________________  The [facility] providers would like to encourage you to use [facility] to communicate with providers for non-urgent requests or questions.  Due to long hold times on the telephone, sending your provider a message by [facility] may be a faster and more efficient way to get a response.  Please allow 48 business hours for a response.  Please remember that this is for non-urgent requests.  _______________________________________________________  DISCONTINUE: budesonide in 2 weeks.  CONTINUE: Cholestyramine indefinitely.  You will need a follow up appointment in 3 months (May 2023).  We will contact you to schedule this appointment.  Thank you for entrusting me with your care and choosing [facility].  Dr [name]

## 2021-02-16 NOTE — Progress Notes (Signed)
Review of pertinent gastrointestinal problems: 1.  Routine risk for colon cancer.  Colonoscopy April 2018 showed no polyps.  Internal hemorrhoids were noted.  She was recommended to have repeat colonoscopy at 10-year interval for colon cancer screening.  Colonoscopy 2008 was normal. 2.  Chronic diarrhea led to colonoscopy November 2020.  Terminal ileum was normal.  There was mild erythema and granularity throughout the colon.  Biopsies were taken.  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  Budesonide was too expensive.  Imodium caused cramping in nausea but was quite effective even at low doses.  Tried to prescribe mesalamine January 2021 however ended up with sulfasalazine 1gram TID due to her insurance restrictions.  Prednisone trial with good partial response.  Again we tried prescribing Entocort 3 mg pills 3 pills once daily.  Very good response as of November 2021.  Switched to lower dose Entocort at 1 pill once daily and started full-strength balsalazide 3 pills 3 times daily and 1 Imodium pill once daily.  Colonoscopy 09/2020 showed normal terminal ileum which was biopsied, right colon was normal-appearing and was biopsied, left colon was very mildly erythematous and was biopsied.  All colon biopsies showed "collagenous colitis".  I recommended she continue the budesonide 9 mg daily and also start cholestyramine 4 g twice daily, also stop balsalazide completely. 3.  Right-sided abdominal pains June 2022 led to testing including CT scan abdomen pelvis with IV and oral contrast which showed gallstones but otherwise was s essentially normal.,  EGD 09/2020 showed nonspecific distal gastritis which was biopsied.  Normal duodenum was biopsied.  These biopsies were all normal.  Colonoscopy 09/2020 showed normal terminal ileum which was biopsied, right colon was normal-appearing and was biopsied, left colon was very mildly erythematous  and was biopsied.  All colon biopsies showed "collagenous colitis".  I recommended she continue the budesonide 9 mg daily and also start cholestyramine 4 g twice daily, also stop balsalazide completely.     HPI: This is a very pleasant 66 year old woman  I last saw her about 2 months ago, November 2022 here in the office.  Her weight was up 9 pounds from her previous visit at that time.  She had noticed significant improvement in her profuse watery diarrhea on budesonide 3 pills every morning and cholestyramine 1 dose every morning.  I asked that she start backing down her budesonide.  Her weight is up 2 pounds since her last office visit here 2 months ago, same scale.  She has done very well while tapering the budesonide.  She has noticed no change in her stool habits.  She has 2-3 formed bowel movements daily.  She is down to 1 budesonide once daily for the past 2 or 3 weeks.  ROS: complete GI ROS as described in HPI, all other review negative.  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 states she had robot assisted hysterectomy and uterus was removed abdominally due to scar tissue.  she also states that both tubes and ovaries were removed.   STAPEDECTOMY Left 2003   TONSILLECTOMY AND ADENOIDECTOMY      Current Outpatient Medications  Medication Sig Dispense Refill   aspirin EC 81 MG tablet  Take 81 mg by mouth daily.     budesonide (ENTOCORT EC) 3 MG 24 hr capsule Take 2 capsules daily for 6 weeks, then decrease to 1 capsule daily. 90 capsule 6   CALCIUM PO Take 600 mg by mouth daily.     cetirizine (ZYRTEC) 10 MG tablet Take 10 mg by mouth daily.     Cholecalciferol (VITAMIN D PO) Take 5,000 Int'l Units by mouth daily.     cholestyramine (QUESTRAN) 4 g packet Take 4 g by mouth daily.     Cyanocobalamin (VITAMIN B-12) 5000 MCG SUBL Place 5,000 mcg under the tongue once a week.     enalapril (VASOTEC) 20 MG tablet Take 1 tablet  Daily  for BP 90 tablet 3   Flaxseed Oil (LINSEED OIL) OIL Take 1,200 mg by mouth 2 (two) times daily.      hyoscyamine (LEVSIN SL) 0.125 MG SL tablet DISSOLVE 1 TABLET IN MOUTH EVERY 4 HOURS AS NEEDED FOR CRAMPS AND FOR NAUSEA 90 tablet 0   loperamide (IMODIUM A-D) 2 MG tablet Take 2 mg by mouth 4 (four) times daily as needed for diarrhea or loose stools.     Multiple Vitamin (MULTIVITAMIN) capsule Take 1 capsule by mouth daily.     Probiotic Product (PROBIOTIC DAILY PO) Take by mouth daily.     simvastatin (ZOCOR) 20 MG tablet Take 1 tablet at Bedtime for Cholesterol 90 tablet 3   No current facility-administered medications for this visit.    Allergies as of 02/16/2021   (No Known Allergies)    Family History  Problem Relation Age of Onset   Cancer Mother        uterine, kidney   Hypertension Mother    Colon polyps Mother    Stomach cancer Mother    Diabetes Father    Hypertension Father    Stroke Father    Cancer Father        kidney   Uterine cancer Sister    Colon cancer Maternal Grandmother    Colon cancer Maternal Grandfather    Heart disease Paternal Grandmother    Breast cancer Paternal Aunt    Cancer Maternal Aunt        stomach, colon, bladder   Cancer Maternal Uncle        stomach, colon, bladder   Esophageal cancer Neg Hx    Rectal cancer Neg Hx     Social History   Socioeconomic History   Marital status: Married     Spouse name: Not on file   Number of children: 1   Years of education: Not on file   Highest education level: Not on file  Occupational History   Not on file  Tobacco Use   Smoking status: Never   Smokeless tobacco: Never  Vaping Use   Vaping Use: Never used  Substance and Sexual Activity   Alcohol use: No   Drug use: No   Sexual activity: Yes  Partners: Male    Birth control/protection: Post-menopausal, Surgical  Other Topics Concern   Not on file  Social History Narrative   Not on file   Social Determinants of Health   Financial Resource Strain: Not on file  Food Insecurity: Not on file  Transportation Needs: Not on file  Physical Activity: Not on file  Stress: Not on file  Social Connections: Not on file  Intimate Partner Violence: Not on file     Physical Exam: BP (!) 150/80    Pulse 82    Ht _0  (1.549 m)    Wt 132 lb (59.9 kg)    BMI 24.94 kg/m  Constitutional: generally well-appearing Psychiatric: alert and oriented x3 Abdomen: soft, nontender, nondistended, no obvious ascites, no peritoneal signs, normal bowel sounds No peripheral edema noted in lower extremities  Assessment and plan: 66 y.o. female with microscopic colitis, collagenous colitis  Her symptoms remain under very good control while tapering the budesonide.  She is going to stay on 1 pill of budesonide daily for another 2 or 3 weeks and then stop completely.  She will maintain her cholestyramine at its current 1 daily dose, probably indefinitely.  She will return to see me in 3 months and sooner if needed.  She is quite happy about her response  Please see the "Patient Instructions" section for addition details about the plan.  Owens Loffler, MD [facility] 02/16/2021, 9:04 AM   Total time on date of encounter was 20 minutes (this included time spent preparing to see the patient reviewing records; obtaining and/or reviewing separately obtained history; performing a medically  appropriate exam and/or evaluation; counseling and educating the patient and family if present; ordering medications, tests or procedures if applicable; and documenting clinical information in the health record).

## 2021-02-23 ENCOUNTER — Other Ambulatory Visit: Payer: Self-pay | Admitting: Internal Medicine

## 2021-02-23 DIAGNOSIS — Z1231 Encounter for screening mammogram for malignant neoplasm of breast: Secondary | ICD-10-CM

## 2021-02-25 ENCOUNTER — Ambulatory Visit
Admission: RE | Admit: 2021-02-25 | Discharge: 2021-02-25 | Disposition: A | Discharge status: Home / Self Care | Payer: Medicare Other | Source: Ambulatory Visit | Attending: Internal Medicine | Admitting: Internal Medicine

## 2021-02-25 DIAGNOSIS — Z1231 Encounter for screening mammogram for malignant neoplasm of breast: Secondary | ICD-10-CM

## 2021-04-07 ENCOUNTER — Other Ambulatory Visit: Payer: Self-pay | Admitting: Internal Medicine

## 2021-04-26 ENCOUNTER — Telehealth: Payer: Self-pay | Admitting: Adult Health

## 2021-04-26 DIAGNOSIS — R197 Diarrhea, unspecified: Secondary | ICD-10-CM

## 2021-04-26 DIAGNOSIS — R109 Unspecified abdominal pain: Secondary | ICD-10-CM

## 2021-04-26 MED ORDER — HYOSCYAMINE SULFATE 0.125 MG SL SUBL: SUBLINGUAL_TABLET | SUBLINGUAL | 0 refills | Status: DC

## 2021-04-26 NOTE — Telephone Encounter (Signed)
Wanting this medication moved to the [employer] on file. hyoscyamine (LEVSIN SL) 0.125 MG SL tablet ?

## 2021-04-26 NOTE — Addendum Note (Signed)
Addended by: Chancy Hurter on: 04/26/2021 02:17 PM ? ? Modules accepted: Orders ? ?

## 2021-05-08 ENCOUNTER — Other Ambulatory Visit: Payer: Self-pay | Admitting: Gastroenterology

## 2021-05-09 ENCOUNTER — Other Ambulatory Visit: Payer: Self-pay | Admitting: Gastroenterology

## 2021-05-10 ENCOUNTER — Other Ambulatory Visit: Payer: Self-pay | Admitting: Adult Health

## 2021-05-10 DIAGNOSIS — R197 Diarrhea, unspecified: Secondary | ICD-10-CM

## 2021-05-10 DIAGNOSIS — R109 Unspecified abdominal pain: Secondary | ICD-10-CM

## 2021-05-19 ENCOUNTER — Encounter: Payer: Self-pay | Admitting: Internal Medicine

## 2021-05-19 NOTE — Progress Notes (Signed)
[place]    ADULT    &    ADOLESCENT    INTERNAL     MEDICINE ?Marta Lamas, D.NP                               Kirtland Bouchard, MD                                 Darrol Jump, D.NP                       ?[facility] ?[place]    [place] ?Telephone   [phone] ?TeleFax   ([phone]   [phone]  ? ? ?                        Annual  Comprehensive Evaluation   &  Examination ? ?Future Appointments  ?Date Time Provider Department  ?05/20/2021               CPE 11:00 AM [name], MD [facility]  ?06/07/2021  1:50 PM Milus Banister, MD [facility]  ?10/07/2021            Wellness 11:00 AM Darrol Jump, D.NP [facility]  ?05/25/2022              CPE 11:00 AM [name], MD [facility]  ? ? ?    This very nice 66 y.o. MWF presents for a Screening /Preventative Visit & comprehensive evaluation and management of multiple medical co-morbidities.  Patient has been followed for HTN, HLD, T2_NIDDM  Prediabetes  and Vitamin D Deficiency. Patient is on Budesonide & Cholestyramine for Collagenous Colitis followed by Dr [name]. \ ? ? ?    Patient also reports that she has had a sty of the Rt eye for about a month that she has been applying warm compresses to the eye. ? ? ?     HTN predates since  2010. Patient's BP has been controlled at home and patient denies any cardiac symptoms as chest pain, palpitations, shortness of breath, dizziness or ankle swelling. Today's BP  is at goal - 122/78  ? ?    Patient's hyperlipidemia is controlled with diet and medications. Patient denies myalgias or other medication SE's. Last lipids were at goal except elevated Trig's : ? ?Lab Results  ?Component Value Date  ? CHOL 173 10/06/2020  ? HDL 45 (L) 10/06/2020  ? [place] 97 10/06/2020  ? TRIG 224 (H) 10/06/2020  ? CHOLHDL 3.8 10/06/2020  ? ? ? ?    Patient has hx/o prediabetes predating (A1c 5.8% /2014) and patient denies reactive hypoglycemic symptoms, visual  blurring, diabetic polys or paresthesias. Last A1c was normal & at goal : ? ?Lab Results  ?Component Value Date  ? HGBA1C 5.5 05/17/2020  ? ? ? ?    Finally, patient has history of Vitamin D Deficiency and last Vitamin D was at goal : ? ?Lab Results  ?Component Value Date  ? VD25OH 101 (H) 05/17/2020  ? ? ? ?Current Outpatient Medications on File Prior to Visit  ?Medication Sig  ? aspirin EC 81 MG tablet Take 81 mg by mouth daily.  ?  budesonide (ENTOCORT EC) 3 MG 24 hr capsule Take 2 capsules daily for 6 weeks, then decrease to 1 capsule daily.  ? CALCIUM PO Take 600 mg by mouth daily.  ? cetirizine (ZYRTEC) 10 MG tablet Take 10 mg by mouth daily.  ? Cholecalciferol (VITAMIN D PO) Take 5,000 Int'l Units by mouth daily.  ? cholestyramine (QUESTRAN) 4 g packet MIX AND DRINK 1 PACKET(4 GRAMS) BY MOUTH DAILY  ? Cyanocobalamin (VITAMIN B-12) 5000 MCG SUBL Place 5,000 mcg under the tongue once a week.  ? enalapril (VASOTEC) 20 MG tablet TAKE 1 TABLET BY MOUTH EVERY DAY FOR BLOOD PRESSURE  ? Flaxseed Oil (LINSEED OIL) OIL Take 1,200 mg by mouth 2 (two) times daily.   ? hyoscyamine (LEVSIN SL) 0.125 MG SL tablet DISSOLVE 1 TABLET UNDER THE TONGUE EVERY 4 HOURS AS NEEDED FOR CRAMPS OR NAUSEA  ? loperamide (IMODIUM A-D) 2 MG tablet Take 2 mg by mouth 4 (four) times daily as needed for diarrhea or loose stools.  ? Multiple Vitamin (MULTIVITAMIN) capsule Take 1 capsule by mouth daily.  ? Probiotic Product (PROBIOTIC DAILY PO) Take by mouth daily.  ? simvastatin (ZOCOR) 20 MG tablet TAKE 1 TABLET BY MOUTH AT BEDTIME  ? ? ?No Known Allergies ? ? ?Past Medical History:  ?Diagnosis Date  ? Allergy   ? Arthritis   ? BACK   ? Cancer [facility])   ? UTERINE   ? Cataract   ? MILD  ? GERD (gastroesophageal reflux disease)   ? Hyperlipidemia   ? Hypertension   ? Iritis   ? HLA B27 +  ? Prediabetes   ? S/P ORIF (open reduction internal fixation) fracture 1994  ? Right tib/fib  ? Thyroiditis, subacute   ? Vitamin D deficiency   ? ? ? ?Health  Maintenance  ?Topic Date Due  ? COVID-19 Vaccine (2 - Janssen risk series) 05/21/2019  ? TETANUS/TDAP  10/06/2021 (Originally 07/17/2017)  ? DEXA SCAN  10/07/2023 (Originally 09/24/2020)  ? Zoster Vaccines- Shingrix (1 of 2) 10/07/2023 (Originally [birth year]/1974)  ? INFLUENZA VACCINE  08/16/2021  ? MAMMOGRAM  02/26/2023  ? Pneumonia Vaccine 26+ Years old  Completed  ? Hepatitis C Screening  Completed  ? HIV Screening  Completed  ? HPV VACCINES  Aged Out  ? PAP SMEAR-Modifier  Discontinued  ? ? ?Immunization History  ?Administered Date(s) Administered  ? Influenza Inj Mdck Quad  12/03/2017, 11/13/2018  ? Influenza, High Dose  11/05/2020  ? Janssen (J&J) SARS-COV-2 Vacc 04/23/2019  ? PNEUMOCOCCAL -20 10/06/2020  ? PPD Test 09/30/2013, 05/13/2019, 05/17/2020  ? Pneumococcal-23 08/27/2008  ? Tdap 07/18/2007  ? Zoster, Live 12/29/2015  ? ? ?Last Colon  & EGD - Sept 2022 - Dr [name] ? ? ?Last MGM - 02/28/2021 ? ?Past Surgical History:  ?Procedure Laterality Date  ? CESAREAN SECTION    ? COLONOSCOPY  03/17/2005  ? HEMS   ? KNEE ARTHROSCOPY Left 1972  ? right leg repair  1994  ? rod   ? ROBOTIC ASSISTED TOTAL HYSTERECTOMY  2015  ? pt. states she had robot assisted hysterectomy and uterus was removed abdominally due to scar tissue.  she also states that both tubes and ovaries were removed.  ? STAPEDECTOMY Left 2003  ? TONSILLECTOMY AND ADENOIDECTOMY    ? ? ? ?Family History  ?Problem Relation Age of Onset  ? Cancer Mother   ?     uterine, kidney  ? Hypertension Mother   ? Colon polyps Mother   ?  Stomach cancer Mother   ? Diabetes Father   ? Hypertension Father   ? Stroke Father   ? Cancer Father   ?     kidney  ? Uterine cancer Sister   ? Colon cancer Maternal Grandmother   ? Colon cancer Maternal Grandfather   ? Heart disease Paternal Grandmother   ? Breast cancer Paternal Aunt   ? Cancer Maternal Aunt   ?     stomach, colon, bladder  ? Cancer Maternal Uncle   ?     stomach, colon, bladder  ? Esophageal cancer Neg Hx   ? Rectal  cancer Neg Hx   ? ? ? ?Social History  ? ?Tobacco Use  ? Smoking status: Never  ? Smokeless tobacco: Never  ?Vaping Use  ? Vaping Use: Never used  ?Substance Use Topics  ? Alcohol use: No  ? Drug use: No  ? ? ? ? ROS ?Constitutional: Denies fever, chills, weight loss/gain, headaches, insomnia,  night sweats, and change in appetite. Does c/o fatigue. ?Eyes: Denies redness, blurred vision, diplopia, discharge, itchy, watery eyes.  ?ENT: Denies discharge, congestion, post nasal drip, epistaxis, sore throat, earache, hearing loss, dental pain, Tinnitus, Vertigo, Sinus pain, snoring.  ?Cardio: Denies chest pain, palpitations, irregular heartbeat, syncope, dyspnea, diaphoresis, orthopnea, PND, claudication, edema ?Respiratory: denies cough, dyspnea, DOE, pleurisy, hoarseness, laryngitis, wheezing.  ?Gastrointestinal: Denies dysphagia, heartburn, reflux, water brash, pain, cramps, nausea, vomiting, bloating, diarrhea, constipation, hematemesis, melena, hematochezia, jaundice, hemorrhoids ?Genitourinary: Denies dysuria, frequency, urgency, nocturia, hesitancy, discharge, hematuria, flank pain ?Breast: Breast lumps, nipple discharge, bleeding.  ?Musculoskeletal: Denies arthralgia, myalgia, stiffness, Jt. Swelling, pain, limp, and strain/sprain. Denies falls. ?Skin: Denies puritis, rash, hives, warts, acne, eczema, changing in skin lesion ?Neuro: No weakness, tremor, incoordination, spasms, paresthesia, pain ?Psychiatric: Denies confusion, memory loss, sensory loss. Denies Depression. ?Endocrine: Denies change in weight, skin, hair change, nocturia, and paresthesia, diabetic polys, visual blurring, hyper / hypo glycemic episodes.  ?Heme/Lymph: No excessive bleeding, bruising, enlarged lymph nodes. ? ?Physical Exam ? ?BP 122/78   Pulse 68   Temp 97.9 ?F (36.6 ?C)   Resp 16   Ht _0  (1.549 m)   Wt 131 lb 9.6 oz (59.7 kg)   SpO2 98%   BMI 24.87 kg/m?  ? ?General Appearance: Well nourished, well groomed and in no  apparent distress. ? ?Eyes: PERRLA, EOMs, Normal.. Normal fundi and vessels.  Rt conjunctiva injected & upper lid swollen & erythematous. ?Sinuses: No frontal/maxillary tenderness ?ENT/Mouth: EACs patent / TMs  nl. Nares cl

## 2021-05-19 NOTE — Patient Instructions (Signed)
Due to recent changes in healthcare laws, you may see the results of your imaging and laboratory studies on MyChart before your provider has had a chance to review them.  We understand that in some cases there may be results that are confusing or concerning to you. Not all laboratory results come back in the same time frame and the provider may be waiting for multiple results in order to interpret others.  Please give [place] 48 hours in order for your provider to thoroughly review all the results before contacting the office for clarification of your results.  ? ?+++++++++++++++++++++++++ ? Vit D  & ?Vit C 1,000 mg   ?are recommended to help protect  ?against the Covid-19 and other Corona viruses.  ? ? Also it's recommended  ?to take  ?Zinc 50 mg  ?to help  ?protect against the Covid-19   ?and best place to get ? is also on [employer]  ?and don't pay more than 6-8 cents /pill !  ?================================ ?Coronavirus (COVID-19) Are you at risk? ? ?Are you at risk for the Coronavirus (COVID-19)? ? ?To be considered HIGH RISK for Coronavirus (COVID-19), you have to meet the following criteria: ? ?Traveled to [place], [place], [place], [place] or [place]; or in the [place] to [place], [place], [place]  ?or [place]; and have fever, cough, and shortness of breath within the last 2 weeks of travel OR ?Been in close contact with a person diagnosed with COVID-19 within the last 2 weeks and have  ?fever, cough,and shortness of breath ? ?IF YOU DO NOT MEET THESE CRITERIA, YOU ARE CONSIDERED LOW RISK FOR COVID-19. ? ?What to do if you are HIGH RISK for COVID-19? ? ?If you are having a medical emergency, call 911. ?Seek medical care right away. Before you go to a doctor?s office, urgent care or emergency department, ? call ahead and tell them about your recent travel, contact with someone diagnosed with COVID-19  ? and your symptoms.  ?You should receive instructions from your physician?s office regarding next  steps of care.  ?When you arrive at healthcare provider, tell the healthcare staff immediately you have returned from  ?visiting [place], [place], [place], [place] or [place]; or traveled in the [place] to [place], [place],  ?[place] or [place] in the last two weeks or you have been in close contact with a person diagnosed with  ?COVID-19 in the last 2 weeks.   ?Tell the health care staff about your symptoms: fever, cough and shortness of breath. ?After you have been seen by a medical provider, you will be either: ?Tested for (COVID-19) and discharged home on quarantine except to seek medical care if  ?symptoms worsen, and asked to  ?Stay home and avoid contact with others until you get your results (4-5 days)  ?Avoid travel on public transportation if possible (such as bus, train, or airplane) or ?Sent to the Emergency Department by EMS for evaluation, COVID-19 testing  and  ?possible admission depending on your condition and test results. ? ?What to do if you are LOW RISK for COVID-19? ? ?Reduce your risk of any infection by using the same precautions used for avoiding the common cold or flu:  ?Wash your hands often with soap and warm water for at least 20 seconds.  If soap and water are not readily available,  ?use an alcohol-based hand sanitizer with at least 60% alcohol.  ?If coughing or sneezing, cover your mouth and nose by coughing  or sneezing into the elbow areas of your shirt or coat, ? into a tissue or into your sleeve (not your hands). ?Avoid shaking hands with others and consider head nods or verbal greetings only. ?Avoid touching your eyes, nose, or mouth with unwashed hands.  ?Avoid close contact with people who are sick. ?Avoid places or events with large numbers of people in one location, like concerts or sporting events. ?Carefully consider travel plans you have or are making. ?If you are planning any travel outside or inside the [place], visit the CDC?s Travelers? Health webpage for the  latest health notices. ?If you have some symptoms but not all symptoms, continue to monitor at home and seek medical attention  ?if your symptoms worsen. ?If you are having a medical emergency, call 911. ? ? ?>>>>>>>>>>>>>>>>>>>>>>>>>>>>>>>>> ? ?We Do NOT Approve of [employer] ?> > > > > > > > > > > > > > > > > > > > > > > > > > > > > > > > > > > > > > > ? ?Preventive Care for Adults ? ?A healthy lifestyle and preventive care can promote health and wellness. Preventive health guidelines for women include the following key practices. ?A routine yearly physical is a good way to check with your health care provider about your health and preventive screening. It is a chance to share any concerns and updates on your health and to receive a thorough exam. ?Visit your dentist for a routine exam and preventive care every 6 months. Brush your teeth twice a day and floss once a day. Good oral hygiene prevents tooth decay and gum disease. ?The frequency of eye exams is based on your age, health, family medical history, use of contact lenses, and other factors. Follow your health care provider's recommendations for frequency of eye exams. ?Eat a healthy diet. Foods like vegetables, fruits, whole grains, low-fat dairy products, and lean protein foods contain the nutrients you need without too many calories. Decrease your intake of foods high in solid fats, added sugars, and salt. Eat the right amount of calories for you. Get information about a proper diet from your health care provider, if necessary. ?Regular physical exercise is one of the most important things you can do for your health. Most adults should get at least 150 minutes of moderate-intensity exercise (any activity that increases your heart rate and causes you to sweat) each week. In addition, most adults need muscle-strengthening exercises on 2 or more days a week. ?Maintain a healthy weight. The body mass index (BMI) is a screening tool to identify  possible weight problems. It provides an estimate of body fat based on height and weight. Your health care provider can find your BMI and can help you achieve or maintain a healthy weight. For adults 20 years and older: ?A BMI below 18.5 is considered underweight. ?A BMI of 18.5 to 24.9 is normal. ?A BMI of 25 to 29.9 is considered overweight. ?A BMI of 30 and above is considered obese. ?Maintain normal blood lipids and cholesterol levels by exercising and minimizing your intake of saturated fat. Eat a balanced diet with plenty of fruit and vegetables. If your lipid or cholesterol levels are high, you are over 50, or you are at high risk for heart disease, you may need your cholesterol levels checked more frequently. Ongoing high lipid and cholesterol levels should be treated with medicines if diet and exercise are not working. ?If you smoke, find out from  your health care provider how to quit. If you do not use tobacco, do not start. ?Lung cancer screening is recommended for adults aged 4-80 years who are at high risk for developing lung cancer because of a history of smoking. A yearly low-dose CT scan of the lungs is recommended for people who have at least a 30-pack-year history of smoking and are a current smoker or have quit within the past 15 years. A pack year of smoking is smoking an average of 1 pack of cigarettes a day for 1 year (for example: 1 pack a day for 30 years or 2 packs a day for 15 years). Yearly screening should continue until the smoker has stopped smoking for at least 15 years. Yearly screening should be stopped for people who develop a health problem that would prevent them from having lung cancer treatment. ?Avoid use of street drugs. Do not share needles with anyone. Ask for help if you need support or instructions about stopping the use of drugs. ?High blood pressure causes heart disease and increases the risk of stroke.  Ongoing high blood pressure should be treated with medicines if  weight loss and exercise do not work. ?If you are 72-64 years old, ask your health care provider if you should take aspirin to prevent strokes. ?Diabetes screening involves taking a blood sample to check your fast

## 2021-05-20 ENCOUNTER — Encounter: Payer: Self-pay | Admitting: Internal Medicine

## 2021-05-20 ENCOUNTER — Ambulatory Visit (INDEPENDENT_AMBULATORY_CARE_PROVIDER_SITE_OTHER): Payer: Medicare Other | Admitting: Internal Medicine

## 2021-05-20 VITALS — BP 122/78 | HR 68 | Temp 97.9°F | Resp 16 | Ht 61.0 in | Wt 131.6 lb

## 2021-05-20 DIAGNOSIS — E559 Vitamin D deficiency, unspecified: Secondary | ICD-10-CM | POA: Diagnosis not present

## 2021-05-20 DIAGNOSIS — Z8249 Family history of ischemic heart disease and other diseases of the circulatory system: Secondary | ICD-10-CM | POA: Diagnosis not present

## 2021-05-20 DIAGNOSIS — I10 Essential (primary) hypertension: Secondary | ICD-10-CM | POA: Diagnosis not present

## 2021-05-20 DIAGNOSIS — R7309 Other abnormal glucose: Secondary | ICD-10-CM | POA: Diagnosis not present

## 2021-05-20 DIAGNOSIS — H00011 Hordeolum externum right upper eyelid: Secondary | ICD-10-CM

## 2021-05-20 DIAGNOSIS — Z136 Encounter for screening for cardiovascular disorders: Secondary | ICD-10-CM

## 2021-05-20 DIAGNOSIS — E782 Mixed hyperlipidemia: Secondary | ICD-10-CM | POA: Diagnosis not present

## 2021-05-20 DIAGNOSIS — K52831 Collagenous colitis: Secondary | ICD-10-CM

## 2021-05-20 DIAGNOSIS — R5383 Other fatigue: Secondary | ICD-10-CM

## 2021-05-20 DIAGNOSIS — Z79899 Other long term (current) drug therapy: Secondary | ICD-10-CM

## 2021-05-21 MED ORDER — DOXYCYCLINE HYCLATE 100 MG PO CAPS: ORAL_CAPSULE | ORAL | 1 refills | Status: DC

## 2021-05-21 MED ORDER — BACITRA-NEOMYCIN-POLYMYXIN-HC 1 % OP OINT: 1.0000 "application " | TOPICAL_OINTMENT | Freq: Two times a day (BID) | OPHTHALMIC | 1 refills | Status: DC

## 2021-05-21 NOTE — Progress Notes (Signed)
<><><><><><><><><><><><><><><><><><><><><><><><><><><><><><><><><> ?<><><><><><><><><><><><><><><><><><><><><><><><><><><><><><><><><> ?-   Test results slightly outside the reference range are not unusual. ?If there is anything important, I will review this with you,  ?otherwise it is considered normal test values.  ?If you have further questions,  ?please do not hesitate to contact me at the office or via My Chart.  ?<><><><><><><><><><><><><><><><><><><><><><><><><><><><><><><><><> ?<><><><><><><><><><><><><><><><><><><><><><><><><><><><><><><><><> ? ?-  Total Chol  = 176    &  LbBab LDL Chol  = 91 - Both  Excellent  ? ?- Very low risk for Heart Attack  / Stroke ?============================================================ ?============================================================ ? ?-  But . . . . . . ? ?Triglycerides (   298    ) or fats in blood are Way  too high  ?(goal is less than 150)   ? ?- Recommend avoid fried & greasy foods,  sweets / candy,  ? ?- Avoid white rice  ?(brown or wild rice or Quinoa is OK),  ? ?- Avoid white potatoes  ?(sweet potatoes are OK)  ? ?- Avoid anything made from white flour  ?- bagels, doughnuts, rolls, buns, biscuits, white and  ? ?wheat breads, pizza crust and traditional  ?pasta made of white flour & egg white ? ?- (vegetarian pasta or spinach or wheat pasta is OK).   ? ?- Multi-grain bread is OK - like multi-grain flat bread or ? sandwich thins.  ? ?- Avoid alcohol in excess.  ? ?- Exercise is also important. ?<><><><><><><><><><><><><><><><><><><><><><><><><><><><><><><><><> ? ?-  A1c - Normal - No Diabetes  - Great ! ?<><><><><><><><><><><><><><><><><><><><><><><><><><><><><><><><><> ? ?-  Vitamin D = 81   - Excellent  - Please keep dose same  ?<><><><><><><><><><><><><><><><><><><><><><><><><><><><><><><><><> ? ?-  All Else - CBC - Kidneys - Electrolytes - Liver - Magnesium & Thyroid   ? ?- all  Normal /  OK ?<><><><><><><><><><><><><><><><><><><><><><><><><><><><><><><><><> ? ?-  Keep up the [place] Work   ! ?<><><><><><><><><><><><><><><><><><><><><><><><><><><><><><><><><> ?<><><><><><><><><><><><><><><><><><><><><><><><><><><><><><><><><> ? ? ? ? ? ? ? ? ? ?

## 2021-05-22 LAB — CBC WITH DIFFERENTIAL/PLATELET
Absolute Monocytes: 456 cells/uL (ref 200–950)
Basophils Absolute: 48 cells/uL (ref 0–200)
Basophils Relative: 0.7 %
Eosinophils Absolute: 61 cells/uL (ref 15–500)
Eosinophils Relative: 0.9 %
HCT: 44.1 % (ref 35.0–45.0)
Hemoglobin: 14.4 g/dL (ref 11.7–15.5)
Lymphs Abs: 2366 cells/uL (ref 850–3900)
MCH: 30.1 pg (ref 27.0–33.0)
MCHC: 32.7 g/dL (ref 32.0–36.0)
MCV: 92.3 fL (ref 80.0–100.0)
MPV: 10.2 fL (ref 7.5–12.5)
Monocytes Relative: 6.7 %
Neutro Abs: 3869 cells/uL (ref 1500–7800)
Neutrophils Relative %: 56.9 %
Platelets: 253 10*3/uL (ref 140–400)
RBC: 4.78 10*6/uL (ref 3.80–5.10)
RDW: 12.3 % (ref 11.0–15.0)
Total Lymphocyte: 34.8 %
WBC: 6.8 10*3/uL (ref 3.8–10.8)

## 2021-05-22 LAB — COMPLETE METABOLIC PANEL WITH GFR
AG Ratio: 1.8 (calc) (ref 1.0–2.5)
ALT: 26 U/L (ref 6–29)
AST: 21 U/L (ref 10–35)
Albumin: 4.6 g/dL (ref 3.6–5.1)
Alkaline phosphatase (APISO): 119 U/L (ref 37–153)
BUN: 18 mg/dL (ref 7–25)
CO2: 26 mmol/L (ref 20–32)
Calcium: 10.3 mg/dL (ref 8.6–10.4)
Chloride: 106 mmol/L (ref 98–110)
Creat: 0.73 mg/dL (ref 0.50–1.05)
Globulin: 2.6 g/dL (calc) (ref 1.9–3.7)
Glucose, Bld: 90 mg/dL (ref 65–99)
Potassium: 3.7 mmol/L (ref 3.5–5.3)
Sodium: 142 mmol/L (ref 135–146)
Total Bilirubin: 0.4 mg/dL (ref 0.2–1.2)
Total Protein: 7.2 g/dL (ref 6.1–8.1)
eGFR: 91 mL/min/{1.73_m2} (ref >=60)

## 2021-05-22 LAB — TSH: TSH: 1.57 mIU/L (ref 0.40–4.50)

## 2021-05-22 LAB — HEMOGLOBIN A1C
Hgb A1c MFr Bld: 5.4 % of total Hgb (ref <5.7)
Mean Plasma Glucose: 108 mg/dL
eAG (mmol/L): 6 mmol/L

## 2021-05-22 LAB — URINALYSIS, ROUTINE W REFLEX MICROSCOPIC
Bacteria, UA: NONE SEEN /HPF
Bilirubin Urine: NEGATIVE
Glucose, UA: NEGATIVE
Hgb urine dipstick: NEGATIVE
Hyaline Cast: NONE SEEN /LPF
Ketones, ur: NEGATIVE
Nitrite: NEGATIVE
Protein, ur: NEGATIVE
RBC / HPF: NONE SEEN /HPF (ref 0–2)
Specific Gravity, Urine: 1.017 (ref 1.001–1.035)
Squamous Epithelial / HPF: NONE SEEN /HPF (ref <=5)
pH: 5.5 (ref 5.0–8.0)

## 2021-05-22 LAB — MICROSCOPIC MESSAGE

## 2021-05-22 LAB — LIPID PANEL
Cholesterol: 176 mg/dL (ref <200)
HDL: 49 mg/dL — ABNORMAL LOW (ref >=50)
LDL Cholesterol (Calc): 91 mg/dL (calc)
Non-HDL Cholesterol (Calc): 127 mg/dL (calc) (ref <130)
Total CHOL/HDL Ratio: 3.6 (calc) (ref <5.0)
Triglycerides: 298 mg/dL — ABNORMAL HIGH (ref <150)

## 2021-05-22 LAB — VITAMIN D 25 HYDROXY (VIT D DEFICIENCY, FRACTURES): Vit D, 25-Hydroxy: 81 ng/mL (ref 30–100)

## 2021-05-22 LAB — INSULIN, RANDOM: Insulin: 29.2 u[IU]/mL — ABNORMAL HIGH

## 2021-05-22 LAB — MAGNESIUM: Magnesium: 2.1 mg/dL (ref 1.5–2.5)

## 2021-05-22 LAB — MICROALBUMIN / CREATININE URINE RATIO
Creatinine, Urine: 70 mg/dL (ref 20–275)
Microalb Creat Ratio: 7 mcg/mg creat (ref <30)
Microalb, Ur: 0.5 mg/dL

## 2021-06-07 ENCOUNTER — Encounter: Payer: Self-pay | Admitting: Gastroenterology

## 2021-06-07 ENCOUNTER — Ambulatory Visit (INDEPENDENT_AMBULATORY_CARE_PROVIDER_SITE_OTHER): Payer: Medicare Other | Admitting: Gastroenterology

## 2021-06-07 VITALS — BP 142/86 | HR 73 | Ht 61.0 in | Wt 132.0 lb

## 2021-06-07 DIAGNOSIS — K52839 Microscopic colitis, unspecified: Secondary | ICD-10-CM

## 2021-06-07 MED ORDER — BUDESONIDE 3 MG PO CPEP: ORAL_CAPSULE | ORAL | 6 refills | Status: DC

## 2021-06-07 NOTE — Progress Notes (Signed)
Review of pertinent gastrointestinal problems: 1.  Routine risk for colon cancer.  Colonoscopy April 2018 showed no polyps.  Internal hemorrhoids were noted.  She was recommended to have repeat colonoscopy at 10-year interval for colon cancer screening.  Colonoscopy 2008 was normal. 2.  Chronic diarrhea led to colonoscopy November 2020.  Terminal ileum was normal.  There was mild erythema and granularity throughout the colon.  Biopsies were taken.  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  Budesonide was too expensive.  Imodium caused cramping in nausea but was quite effective even at low doses.  Tried to prescribe mesalamine January 2021 however ended up with sulfasalazine 1gram TID due to her insurance restrictions.  Prednisone trial with good partial response.  Again we tried prescribing Entocort 3 mg pills 3 pills once daily.  Very good response as of November 2021.  Switched to lower dose Entocort at 1 pill once daily and started full-strength balsalazide 3 pills 3 times daily and 1 Imodium pill once daily.  Colonoscopy 09/2020 showed normal terminal ileum which was biopsied, right colon was normal-appearing and was biopsied, left colon was very mildly erythematous and was biopsied.  All colon biopsies showed "collagenous colitis".  I recommended she continue the budesonide 9 mg daily and also start cholestyramine 4 g twice daily, also stop balsalazide completely. 3.  Right-sided abdominal pains June 2022 led to testing including CT scan abdomen pelvis with IV and oral contrast which showed gallstones but otherwise was s essentially normal.,  EGD 09/2020 showed nonspecific distal gastritis which was biopsied.  Normal duodenum was biopsied.  These biopsies were all normal.  Colonoscopy 09/2020 showed normal terminal ileum which was biopsied, right colon was normal-appearing and was biopsied, left colon was very mildly erythematous and  was biopsied.  All colon biopsies showed "collagenous colitis".  I recommended she continue the budesonide 9 mg daily and also start cholestyramine 4 g twice daily, also stop balsalazide completely.    HPI: This is a very pleasant 66 year old woman   I last saw her here in the office about 3 months ago.  She is doing quite well on tapering budesonide regimen without any worsening of her bowel symptoms.  3 months ago she was taking 1 budesonide once daily as well as 1 dose of cholestyramine once daily.  On that regimen she was doing very well.  She stopped the budesonide completely and within 1 week she was again having significant urgency and frequency and watery diarrhea.  7 or 8 times per day.  She restarted the budesonide at 1 pill twice daily dosing and within 2 or 3 days her symptoms had resolved again.  On this regimen for the past 2 months she has 2 or 3 solid formed bowel movements daily.  Blood work 05/2021: CBC, complete metabolic, TSH were all normal   ROS: complete GI ROS as described in HPI, all other review negative.  Constitutional:  No unintentional weight loss   Past Medical History:  Diagnosis Date   Allergy    Arthritis    BACK    Cancer ([place])    UTERINE    Cataract    MILD   GERD (gastroesophageal reflux disease)    Hyperlipidemia    Hypertension    Iritis    HLA B27 +   Prediabetes    S/P ORIF (open reduction internal fixation) fracture 1994   Right tib/fib   Thyroiditis, subacute    Vitamin D  deficiency     Past Surgical History:  Procedure Laterality Date   CESAREAN SECTION     COLONOSCOPY  03/17/2005   HEMS    KNEE ARTHROSCOPY Left 1972   right leg repair  1994   rod    ROBOTIC ASSISTED TOTAL HYSTERECTOMY  2015   pt. states she had robot assisted hysterectomy and uterus was removed abdominally due to scar tissue.  she also states that both tubes and ovaries were removed.   STAPEDECTOMY Left 2003   TONSILLECTOMY AND ADENOIDECTOMY       Current Outpatient Medications  Medication Instructions   aspirin EC 81 mg, Oral, Daily   bacitracin-neomycin-polymyxin-hydrocortisone (CORTISPORIN) 1 % ophthalmic ointment 1 application., Right Eye, 2 times daily, Apply to infected eyelid 3 to 4 x / day   budesonide (ENTOCORT EC) 3 MG 24 hr capsule Take 2 capsules daily for 6 weeks, then decrease to 1 capsule daily.   CALCIUM PO 600 mg, Daily   cetirizine (ZYRTEC) 10 mg, Daily   Cholecalciferol (VITAMIN D PO) 5,000 Int'l Units, Oral, Daily   cholestyramine (QUESTRAN) 4 g packet MIX AND DRINK 1 PACKET(4 GRAMS) BY MOUTH DAILY   doxycycline (VIBRAMYCIN) 100 MG capsule Take 1 capsule 2 x /day with meals for Skin Infection   enalapril (VASOTEC) 20 MG tablet TAKE 1 TABLET BY MOUTH EVERY DAY FOR BLOOD PRESSURE   Flaxseed Oil (LINSEED OIL) OIL 1,200 mg, Oral, 2 times daily   hyoscyamine (LEVSIN SL) 0.125 MG SL tablet DISSOLVE 1 TABLET UNDER THE TONGUE EVERY 4 HOURS AS NEEDED FOR CRAMPS OR NAUSEA   loperamide (IMODIUM A-D) 2 mg, Oral, 4 times daily PRN   Multiple Vitamin (MULTIVITAMIN) capsule 1 capsule, Daily   Probiotic Product (PROBIOTIC DAILY PO) Oral, Daily   simvastatin (ZOCOR) 20 MG tablet TAKE 1 TABLET BY MOUTH AT BEDTIME   Vitamin B-12 5,000 mcg, Sublingual, Weekly    Allergies as of 06/07/2021   (No Known Allergies)    Family History  Problem Relation Age of Onset   Cancer Mother        uterine, kidney   Hypertension Mother    Colon polyps Mother    Stomach cancer Mother    Diabetes Father    Hypertension Father    Stroke Father    Cancer Father        kidney   Uterine cancer Sister    Colon cancer Maternal Grandmother    Colon cancer Maternal Grandfather    Heart disease Paternal Grandmother    Breast cancer Paternal Aunt    Cancer Maternal Aunt        stomach, colon, bladder   Cancer Maternal Uncle        stomach, colon, bladder   Esophageal cancer Neg Hx    Rectal cancer Neg Hx     Social History    Socioeconomic History   Marital status: Married    Spouse name: Not on file   Number of children: 1   Years of education: Not on file   Highest education level: Not on file  Occupational History   Not on file  Tobacco Use   Smoking status: Never   Smokeless tobacco: Never  Vaping Use   Vaping Use: Never used  Substance and Sexual Activity   Alcohol use: No   Drug use: No   Sexual activity: Yes    Partners: Male    Birth control/protection: Post-menopausal, Surgical  Other Topics Concern   Not on file  Social  History Narrative   Not on file   Social Determinants of Health   Financial Resource Strain: Not on file  Food Insecurity: Not on file  Transportation Needs: Not on file  Physical Activity: Not on file  Stress: Not on file  Social Connections: Not on file  Intimate Partner Violence: Not on file     Physical Exam: BP (!) 142/86   Pulse 73   Ht 5' 1"  (1.549 m)   Wt 132 lb (59.9 kg)   BMI 24.94 kg/m  Constitutional: generally well-appearing Psychiatric: alert and oriented x3 Abdomen: soft, nontender, nondistended, no obvious ascites, no peritoneal signs, normal bowel sounds No peripheral edema noted in lower extremities  Assessment and plan: 66 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  She is currently on 2 pills a day.  I recommended she cut back to 1 pill once daily and also that she continue her cholestyramine every single day.  She will return to see me in 3 months.  I will probably have her cut back again to 0 budesonide and see how she does.  If the diarrhea returns then we will consider bismuth subsalicylate treatment.  Please see the "Patient Instructions" section for addition details about the plan.  Owens Loffler, MD [facility] 06/07/2021, 1:55 PM   Total time on date of encounter was 25 minutes (this included time spent preparing to see the patient reviewing  records; obtaining and/or reviewing separately obtained history; performing a medically appropriate exam and/or evaluation; counseling and educating the patient and family if present; ordering medications, tests or procedures if applicable; and documenting clinical information in the health record).

## 2021-06-07 NOTE — Patient Instructions (Addendum)
If you are age 66 or older, your body mass index should be between 23-30. Your Body mass index is 24.94 kg/m. If this is out of the aforementioned range listed, please consider follow up with your Primary Care Provider. ________________________________________________________  The [facility] providers would like to encourage you to use [facility] to communicate with providers for non-urgent requests or questions.  Due to long hold times on the telephone, sending your provider a message by [facility] may be a faster and more efficient way to get a response.  Please allow 48 business hours for a response.  Please remember that this is for non-urgent requests.  _______________________________________________________  DECREASE: budesonide 13m to 1 capsule daily until you see [place] in follow up.  You will need a follow up appointment in 3 months (August 2023).  We will contact you to schedule this appointment.  Thank you for entrusting me with your care and choosing [facility]  Dr JArdis Hughs

## 2021-08-29 ENCOUNTER — Ambulatory Visit (INDEPENDENT_AMBULATORY_CARE_PROVIDER_SITE_OTHER): Payer: Medicare Other | Admitting: Internal Medicine

## 2021-08-29 ENCOUNTER — Encounter: Payer: Self-pay | Admitting: Internal Medicine

## 2021-08-29 VITALS — BP 172/88 | HR 79 | Temp 97.3°F | Resp 16 | Ht 61.0 in | Wt 133.2 lb

## 2021-08-29 DIAGNOSIS — G510 Bell's palsy: Secondary | ICD-10-CM

## 2021-08-29 DIAGNOSIS — C44320 Squamous cell carcinoma of skin of unspecified parts of face: Secondary | ICD-10-CM

## 2021-08-29 DIAGNOSIS — R0989 Other specified symptoms and signs involving the circulatory and respiratory systems: Secondary | ICD-10-CM

## 2021-08-29 MED ORDER — DEXAMETHASONE 4 MG PO TABS: ORAL_TABLET | ORAL | 0 refills | Status: DC

## 2021-08-29 NOTE — Patient Instructions (Signed)
Bell's Palsy, Adult  Bell's palsy is a short-term inability to move muscles in a part of the face. The inability to move, also called paralysis, results from inflammation or compression of the seventh cranial nerve. This nerve travels along the skull and under the ear to the side of the face. This nerve is responsible for facial movements that include blinking, closing the eyes, smiling, and frowning. What are the causes? The exact cause of this condition is not known. It may be caused by an infection from a virus, such as the chickenpox (herpes zoster), Epstein-Barr, or mumps virus. What increases the risk? You are more likely to develop this condition if: You are pregnant. You have diabetes. You have had a recent infection in your nose, throat, or airways. You have a weakened body defense system (immune system). You have had a facial injury, such as a fracture. You have a family history of Bell's palsy. What are the signs or symptoms? Symptoms of this condition include: Weakness on one side of the face. Drooping eyelid and corner of the mouth. Excessive tearing in one eye. Difficulty closing the eyelid. Dry eye. Drooling. Dry mouth. Changes in taste. Change in facial appearance. Pain behind one ear. Ringing in one or both ears. Sensitivity to sound in one ear. Facial twitching. Headache. Impaired speech. Dizziness. Difficulty eating or drinking. Most of the time, only one side of the face is affected. In rare cases, Bell's palsy may affect the whole face. How is this diagnosed? This condition is diagnosed based on: Your symptoms. Your medical history. A physical exam. You may also have to see health care providers who specialize in disorders of the nerves (neurologist) or diseases and conditions of the eye (ophthalmologist). You may have tests, such as: A test to check for nerve damage (electromyogram). Imaging studies, such as a CT scan or an MRI. Blood tests. How is this  treated? This condition affects every person differently. Sometimes symptoms go away without treatment within a couple weeks. If treatment is needed, it varies from person to person. The goal of treatment is to reduce inflammation and protect the eye from damage. Treatment for Bell's palsy may include: Medicines, such as: Steroids to reduce swelling and inflammation. Antiviral medicines. Pain relievers, including aspirin, acetaminophen, or ibuprofen. Eye drops or ointment to keep your eye moist. Eye protection, if you cannot close your eye. Exercises or massage to regain muscle strength and function (physical therapy). Follow these instructions at home:  Take over-the-counter and prescription medicines only as told by your health care provider. If your eye is affected: Keep your eye moist with eye drops or ointment as told by your health care provider. Follow instructions for eye care and protection as told by your health care provider. Do any physical therapy exercises as told by your health care provider. Keep all follow-up visits. This is important. Contact a health care provider if: You have a fever or chills. Your symptoms do not get better within 2-3 weeks, or your symptoms get worse. Your eye is red, irritated, or painful. You have new symptoms. Get help right away if: You have weakness or numbness in a part of your body other than your face. You have trouble swallowing. You develop neck pain or stiffness. You develop dizziness or shortness of breath. Summary Bell's palsy is a short-term inability to move muscles in a part of the face. The inability to move results from inflammation or compression of the facial nerve. This condition affects every person  differently. Sometimes symptoms go away without treatment within a couple weeks. If treatment is needed, it varies from person to person. The goal of treatment is to reduce inflammation and protect the eye from damage. Contact  your health care provider if your symptoms do not get better within 2-3 weeks, or your symptoms get worse. This information is not intended to replace advice given to you by your health care provider. Make sure you discuss any questions you have with your health care provider. Document Revised: 10/02/2019 Document Reviewed: 10/02/2019 Elsevier Patient Education  [place].

## 2021-08-29 NOTE — Progress Notes (Signed)
Future Appointments  Date Time Provider Department  09/13/2021  9:10 AM Milus Banister, MD [facility]  10/13/2021  9:30 AM Darrol Jump, NP [facility]  01/12/2022  9:30 AM [name], MD [facility]  05/25/2022 11:00 AM [name], MD [facility]    History of Present Illness:  Patient presents with a 24-36 hour mild weakness of the Lt face with some drooling from the Lt mouth , tearing of the Lt eye . N other perceived neuro sx's.   Medications  Current Outpatient Medications (Endocrine & Metabolic):    budesonide (ENTOCORT EC) 3 MG 24 hr capsule, Take 1 capsule daily.  Current Outpatient Medications (Cardiovascular):    cholestyramine (QUESTRAN) 4 g packet, MIX AND DRINK 1 PACKET(4 GRAMS) BY MOUTH DAILY   enalapril (VASOTEC) 20 MG tablet, TAKE 1 TABLET BY MOUTH EVERY DAY FOR BLOOD PRESSURE   simvastatin (ZOCOR) 20 MG tablet, TAKE 1 TABLET BY MOUTH AT BEDTIME  Current Outpatient Medications (Respiratory):    cetirizine (ZYRTEC) 10 MG tablet, Take 10 mg by mouth daily.  Current Outpatient Medications (Analgesics):    aspirin EC 81 MG tablet, Take 81 mg by mouth daily.  Current Outpatient Medications (Hematological):    Cyanocobalamin (VITAMIN B-12) 5000 MCG SUBL, Place 5,000 mcg under the tongue once a week.  Current Outpatient Medications (Other):    CALCIUM PO, Take 600 mg by mouth daily.   Cholecalciferol (VITAMIN D PO), Take 5,000 Int'l Units by mouth daily.   Flaxseed Oil (LINSEED OIL) OIL, Take 1,200 mg by mouth 2 (two) times daily.    hyoscyamine (LEVSIN SL) 0.125 MG SL tablet, DISSOLVE 1 TABLET UNDER THE TONGUE EVERY 4 HOURS AS NEEDED FOR CRAMPS OR NAUSEA   Multiple Vitamin (MULTIVITAMIN) capsule, Take 1 capsule by mouth daily.   Probiotic Product (PROBIOTIC DAILY PO), Take by mouth daily.   bacitracin-neomycin-polymyxin-hydrocortisone (CORTISPORIN) 1 % ophthalmic ointment, Place 1 application. into the right eye 2 (two) times daily. Apply to infected  eyelid 3 to 4 x / day (Patient not taking: Reported on 08/29/2021)   doxycycline (VIBRAMYCIN) 100 MG capsule, Take 1 capsule 2 x /day with meals for Skin Infection (Patient not taking: Reported on 08/29/2021)   loperamide (IMODIUM A-D) 2 MG tablet, Take 2 mg by mouth 4 (four) times daily as needed for diarrhea or loose stools. (Patient not taking: Reported on 08/29/2021)  Problem list She has Hypertension; Hyperlipidemia, mixed; Abnormal glucose; Vitamin D deficiency; Medication management; Diarrhea; Ulcerative colitis ([place]); and Collagenous colitis on their problem list.   Observations/Objective:  BP (!) 172/88   Pulse 79   Temp (!) 97.3 F (36.3 C)   Resp 16   Ht 5' 1"  (1.549 m)   Wt 133 lb 3.2 oz (60.4 kg)   SpO2 98%   BMI 25.17 kg/m   BP rechecked  at 163/97 -(patient attributes it to anxiety)  SKIN -  10 mm ulcerative skin cancer of middle upper forehead at anterior frontal area.  HEENT - WNL. Neck - supple.  Chest - Clear equal BS. Cor - Nl HS. RRR w/o sig MGR. PP 1(+). No edema. MS- FROM w/o deformities.  Gait Nl. Neuro -  Nl w/o focal abnormalities. Mild Lt hemifacial paresis with widening of Lt inter palpebral fissure & tearing os.  Mild flattening of the Lt nasolabial fold.   Assessment and Plan:  1. Left-sided Bell's palsy - dexamethasone (DECADRON) 4 MG tablet;  Take 1 tab 3 x day - 3 days, then 2 x day -  3 days, then 1 tab daily   Dispense: 20 tablet  (Hold budesonide while on Decadron)  - discussed moistening eye drops if needed , taping / patching as needed    2. Labile hypertension - monitor BPs bid & increase enalapril 20 mg to BID if BP stays elevated.    3. Squamous cell carcinoma of skin of face - Refer to [facility]    Follow Up Instructions:       I discussed the assessment and treatment plan with the patient. The patient was provided an opportunity to ask questions and all were answered. The patient agreed with the plan and  demonstrated an understanding of the instructions.       The patient was advised to call back or seek an in-person evaluation if the symptoms worsen or if the condition fails to improve as anticipated.    Kirtland Bouchard, MD

## 2021-09-02 ENCOUNTER — Other Ambulatory Visit: Payer: Self-pay | Admitting: Gastroenterology

## 2021-09-06 ENCOUNTER — Other Ambulatory Visit: Payer: Self-pay | Admitting: Internal Medicine

## 2021-09-06 MED ORDER — BISOPROLOL-HYDROCHLOROTHIAZIDE 10-6.25 MG PO TABS: ORAL_TABLET | ORAL | 1 refills | Status: DC

## 2021-09-07 ENCOUNTER — Other Ambulatory Visit: Payer: Self-pay | Admitting: Internal Medicine

## 2021-09-09 ENCOUNTER — Telehealth: Payer: Self-pay | Admitting: Internal Medicine

## 2021-09-09 NOTE — Telephone Encounter (Signed)
Returned patient's call regarding c/o hoarse throat. Per Dr. [name] recommendation: take one 12m Prilosec or Nexium two times daily for 2 weeks. Call office if no improvement, will refer to ENT. Patient understands and agrees to treatment.

## 2021-09-13 ENCOUNTER — Ambulatory Visit: Payer: Medicare Other | Admitting: Gastroenterology

## 2021-09-20 ENCOUNTER — Telehealth: Payer: Self-pay | Admitting: Nurse Practitioner

## 2021-09-20 NOTE — Telephone Encounter (Signed)
Yes please have her take a whole tab of Ziac and continue to monitor.  If remains elevated will need an appointment

## 2021-09-20 NOTE — Telephone Encounter (Signed)
Patient called and states that Dr. Melford Aase wanted her to call if her blood pressure remained high with 1/2 tablet of blood pressure medication. Her bp yesterday was 164/102 and this morning it was 153/89 P:70. Should she increase to one whole tablet?

## 2021-09-20 NOTE — Telephone Encounter (Signed)
Patient aware of recommendations and will continue to monitor her BP and let us know how it is doing.

## 2021-10-05 ENCOUNTER — Ambulatory Visit (INDEPENDENT_AMBULATORY_CARE_PROVIDER_SITE_OTHER): Payer: Medicare Other | Admitting: Nurse Practitioner

## 2021-10-05 ENCOUNTER — Encounter: Payer: Self-pay | Admitting: Nurse Practitioner

## 2021-10-05 VITALS — BP 150/80 | HR 72 | Wt 135.6 lb

## 2021-10-05 DIAGNOSIS — K52831 Collagenous colitis: Secondary | ICD-10-CM | POA: Diagnosis not present

## 2021-10-05 NOTE — Progress Notes (Signed)
Chief Complaint: Follow-up on collagenous colitis   Assessment &  Plan   # 66 yo female with collagenous colitis. Doing well on 3 mg of Entocort daily and once daily Cholestyramine. Here for 3 month follow up. Plan was to stop treatment and see how she does.  Stop Entocort and Cholestyramine Call if develops recurrent loose stools and will try Bismuth - Three 262  mg tablets TID.  Reviewed meds as risk factors for collagenous colitis. She is taking a baby ASA but no other NSAIDs, not on PPI therapy. However she is taking a statin  # Colon cancer screening. No polyps / cancers on last colonoscopy in Sept 2022  HPI   Vicki Hansen is a 66 y.o. female known to Dr.  Ardis Hansen with a past medical history significant for collagenous colitis, HTN, HLD.  See PMH /PSH for additional history   Vicki Hansen was seen last by Dr. Ardis Hansen 06/07/2021 for ongoing care of her collagenous colitis.  She was doing well at that time on 2 budesonide daily and daily cholestyramine. Three months prior she tried to come off the budesonide but became symptomatic within a week.  Dr. Ardis Hansen recommended she cut back from two pills to 1 daily and continue daily cholestyramine.  Plan was to eventually get her off Budesonide.   Interval History: Here for follow up. No diarrhea. Bowel movements are solid, having 1-2 a day. Taking one budesonide daily and a daily dose of cholestyramine in applesauce.  No abdominal pain.  She is interested in stopping treatment for collagenous colitis and seeing how things go  Previous GI Evaluation   09/29/2020 colonoscopy -- Terminal ileum appeared normal, biopsies taken.  Right colon appeared normal except for very mild erythema, biopsies taken.  Mild erythema of the left colon, biopsies taken, mild erythema of the rectosigmoid colon, biopsies taken Diagnosis 1. Surgical [P], terminal ileum - UNREMARKABLE ILEAL MUCOSA. - NO ACTIVE INFLAMMATION, CHRONIC CHANGES OR GRANULOMAS. 2. Surgical  [P], right colon - COLITIS CONSISTENT WITH COLLAGENOUS COLITIS. - SEE MICROSCOPIC DESCRIPTION. 3. Surgical [P], left colon bx's - COLITIS CONSISTENT WITH COLLAGENOUS COLITIS. - SEE MICROSCOPIC DESCRIPTION. 4. Surgical [P], colon, recto-sigmoid - COLITIS CONSISTENT WITH COLLAGENOUS COLITIS. - SEE MICROSCOPIC DESCRIPTION. 5. Surgical [P], duodenal - UNREMARKABLE DUODENAL MUCOSA. - NO FEATURES OF CELIAC SPRUE OR GRANULOMAS. 6. Surgical [P], gastric antrum and gastric body - GASTRIC MUCOSA WITH FOCAL EROSION AND CHANGES CONSISTENT WITH REACTIVE GASTROPATHY. [place] NEGATIVE FOR HELICOBACTER PYLORI. - NO INTESTINAL METAPLASIA, DYSPLASIA OR CARCINOMA. Microscopic Comment 2. 2, 3 &4. The colon biopsy showed mildly increased infiltrate in the lamina propria associated with increased intraepithelial lymphocytes and patchy thickening of the subepithelial collagen consistent with collagenous colitis.   Labs:     Latest Ref Rng & Units 05/20/2021   10:46 AM 10/06/2020   11:20 AM 05/17/2020    1:50 PM  CBC  WBC 3.8 - 10.8 Thousand/uL 6.8  6.2  7.8   Hemoglobin 11.7 - 15.5 g/dL 14.4  14.6  14.0   Hematocrit 35.0 - 45.0 % 44.1  44.2  42.6   Platelets 140 - 400 Thousand/uL 253  275  270        Latest Ref Rng & Units 05/20/2021   10:46 AM 10/06/2020   11:20 AM 05/17/2020    1:50 PM  Hepatic Function  Total Protein 6.1 - 8.1 g/dL 7.2  7.0  7.1   AST 10 - 35 U/L 21  21  21  ALT 6 - 29 U/L _0 Total Bilirubin 0.2 - 1.2 mg/dL 0.4  0.3  0.4      Past Medical History:  Diagnosis Date   Allergy    Arthritis    BACK    Cancer ([place])    UTERINE    Cataract    MILD   GERD (gastroesophageal reflux disease)    Hyperlipidemia    Hypertension    Iritis    HLA B27 +   Prediabetes    S/P ORIF (open reduction internal fixation) fracture 1994   Right tib/fib   Thyroiditis, subacute    Vitamin D deficiency     Past Surgical History:  Procedure Laterality Date   CESAREAN  SECTION     COLONOSCOPY  03/17/2005   HEMS    KNEE ARTHROSCOPY Left 1972   right leg repair  1994   rod    ROBOTIC ASSISTED TOTAL HYSTERECTOMY  2015   pt. states she had robot assisted hysterectomy and uterus was removed abdominally due to scar tissue.  she also states that both tubes and ovaries were removed.   STAPEDECTOMY Left 2003   TONSILLECTOMY AND ADENOIDECTOMY      Current Medications, Allergies, Family History and Social History were reviewed in [employer] record.     Current Outpatient Medications  Medication Sig Dispense Refill   aspirin EC 81 MG tablet Take 81 mg by mouth daily.     bisoprolol-hydrochlorothiazide (ZIAC) 10-6.25 MG tablet Take 1 tablet every Morning for BP 90 tablet 1   budesonide (ENTOCORT EC) 3 MG 24 hr capsule Take 1 capsule daily. 30 capsule 6   CALCIUM PO Take 600 mg by mouth daily.     cetirizine (ZYRTEC) 10 MG tablet Take 10 mg by mouth daily.     Cholecalciferol (VITAMIN D PO) Take 5,000 Int'l Units by mouth daily.     cholestyramine (QUESTRAN) 4 g packet MIX AND DRINK 1 PACKET(4 GRAMS) BY MOUTH DAILY 90 each 0   Cyanocobalamin (VITAMIN B-12) 5000 MCG SUBL Place 5,000 mcg under the tongue once a week.     enalapril (VASOTEC) 20 MG tablet TAKE 1 TABLET BY MOUTH EVERY DAY FOR BLOOD PRESSURE 90 tablet 3   Flaxseed Oil (LINSEED OIL) OIL Take 1,200 mg by mouth 2 (two) times daily.      hyoscyamine (LEVSIN SL) 0.125 MG SL tablet DISSOLVE 1 TABLET UNDER THE TONGUE EVERY 4 HOURS AS NEEDED FOR CRAMPS OR NAUSEA 90 tablet 0   Multiple Vitamin (MULTIVITAMIN) capsule Take 1 capsule by mouth daily.     Probiotic Product (PROBIOTIC DAILY PO) Take by mouth daily.     simvastatin (ZOCOR) 20 MG tablet TAKE 1 TABLET BY MOUTH AT BEDTIME 90 tablet 3   No current facility-administered medications for this visit.    Review of Systems: No chest pain. No shortness of breath. No urinary complaints.    Physical Exam  Wt Readings from Last 3  Encounters:  08/29/21 133 lb 3.2 oz (60.4 kg)  06/07/21 132 lb (59.9 kg)  05/20/21 131 lb 9.6 oz (59.7 kg)    BP (!) 150/80   Pulse 72   Wt 135 lb 9.6 oz (61.5 kg)   SpO2 99%   BMI 25.62 kg/m  Constitutional:  Generally well appearing female in no acute distress. Psychiatric: Pleasant. Normal mood and affect. Behavior is normal. EENT: Pupils normal.  Conjunctivae are normal. No scleral icterus. Neck supple.  Cardiovascular: Normal  rate, regular rhythm. No edema Pulmonary/chest: Effort normal and breath sounds normal. No wheezing, rales or rhonchi. Abdominal: Soft, nondistended, nontender. Bowel sounds active throughout. There are no masses palpable. No hepatomegaly. Neurological: Alert and oriented to person place and time. Skin: Skin is warm and dry. No rashes noted.  Tye Savoy, NP  10/05/2021, 8:21 AM

## 2021-10-05 NOTE — Patient Instructions (Signed)
Stop taking budesonide and and cholestyramine.  Call our office if you have recurrent diarrhea.   The [facility] providers would like to encourage you to use [facility] to communicate with providers for non-urgent requests or questions.  Due to long hold times on the telephone, sending your provider a message by [facility] may be a faster and more efficient way to get a response.  Please allow 48 business hours for a response.  Please remember that this is for non-urgent requests.

## 2021-10-05 NOTE — Progress Notes (Signed)
____________________________________________________________  Attending physician addendum:  Thank you for sending this case to me. I have reviewed the entire note and agree with the plan.  I agree with the plan to periodically need follow-up budesonide for this condition.  And while you can certainly try bismuth for recurrent symptoms, budesonide tends to be more effective.  Wilfrid Lund, MD  ____________________________________________________________

## 2021-10-07 ENCOUNTER — Ambulatory Visit: Payer: Medicare Other | Admitting: Adult Health

## 2021-10-07 ENCOUNTER — Ambulatory Visit: Payer: Medicare Other | Admitting: Nurse Practitioner

## 2021-10-13 ENCOUNTER — Ambulatory Visit (INDEPENDENT_AMBULATORY_CARE_PROVIDER_SITE_OTHER): Payer: Medicare Other | Admitting: Nurse Practitioner

## 2021-10-13 ENCOUNTER — Encounter: Payer: Self-pay | Admitting: Nurse Practitioner

## 2021-10-13 VITALS — BP 140/80 | HR 62 | Temp 97.5°F | Ht 61.0 in | Wt 134.4 lb

## 2021-10-13 DIAGNOSIS — R7309 Other abnormal glucose: Secondary | ICD-10-CM

## 2021-10-13 DIAGNOSIS — K5190 Ulcerative colitis, unspecified, without complications: Secondary | ICD-10-CM | POA: Diagnosis not present

## 2021-10-13 DIAGNOSIS — R6889 Other general symptoms and signs: Secondary | ICD-10-CM

## 2021-10-13 DIAGNOSIS — Z79899 Other long term (current) drug therapy: Secondary | ICD-10-CM

## 2021-10-13 DIAGNOSIS — E559 Vitamin D deficiency, unspecified: Secondary | ICD-10-CM

## 2021-10-13 DIAGNOSIS — Z0000 Encounter for general adult medical examination without abnormal findings: Secondary | ICD-10-CM

## 2021-10-13 DIAGNOSIS — E782 Mixed hyperlipidemia: Secondary | ICD-10-CM

## 2021-10-13 DIAGNOSIS — Z0001 Encounter for general adult medical examination with abnormal findings: Secondary | ICD-10-CM | POA: Diagnosis not present

## 2021-10-13 DIAGNOSIS — I10 Essential (primary) hypertension: Secondary | ICD-10-CM

## 2021-10-13 NOTE — Patient Instructions (Signed)

## 2021-10-13 NOTE — Progress Notes (Signed)
MEDICARE ANNUAL WELLNESS VISIT AND FOLLOW UP  Assessment:   Vicki Hansen was seen today for medicare wellness.  Diagnoses and all orders for this visit:  Welcome to Medicare preventive visit Due annually  Health maintenance reviewed  Primary hypertension Controlled Discussed DASH (Dietary Approaches to Stop Hypertension) DASH diet is lower in sodium than a typical American diet. Cut back on foods that are high in saturated fat, cholesterol, and trans fats. Eat more whole-grain foods, fish, poultry, and nuts Remain active and exercise as tolerated daily.  Monitor BP at home-Call if greater than 130/80.    Hyperlipidemia, mixed Continues to be elevated  Discussed lifestyle modifications. Recommended diet heavy in fruits and veggies, omega 3's. Decrease consumption of animal meats, cheeses, and dairy products. Remain active and exercise as tolerated. Continue to monitor. Check lipids/TSH  Other abnormal glucose (hx of prediabetes) Education: Reviewed 'ABCs' of diabetes management  Discussed goals to be met and/or maintained include A1C (<7) Blood pressure (<130/80) Cholesterol (LDL <70) Continue Eye Exam yearly  Continue Dental Exam Q6 mo Discussed dietary recommendations Discussed Physical Activity recommendations   Vitamin D deficiency Continue vitamin D supplement; check annually and PRN  Medication management All medications discussed and reviewed in full. All questions and concerns regarding medications addressed.    Ulcerative colitis without complications, unspecified location [facility]) Dr. [name] is managing Colonoscopy UTD Follows Q6 mo  Orders Placed This Encounter  Procedures   Lipid panel    Notify office for further evaluation and treatment, questions or concerns if any reported s/s fail to improve.   The patient was advised to call back or seek an in-person evaluation if any symptoms worsen or if the condition fails to improve as anticipated.   Further  disposition pending results of labs. Discussed med's effects and SE's.    I discussed the assessment and treatment plan with the patient. The patient was provided an opportunity to ask questions and all were answered. The patient agreed with the plan and demonstrated an understanding of the instructions.  Discussed med's effects and SE's. Screening labs and tests as requested with regular follow-up as recommended.  I provided 40 minutes of face-to-face time during this encounter including counseling, chart review, and critical decision making was preformed.   Future Appointments  Date Time Provider [place]  01/12/2022  9:30 AM [name], MD [facility] None  05/25/2022 11:00 AM [name], MD [facility] None  08/25/2022  9:00 AM Darrol Jump, NP [facility] None     Plan:   During the course of the visit the patient was educated and counseled about appropriate screening and preventive services including:   Pneumococcal vaccine  Prevnar 13 Influenza vaccine Td vaccine Screening electrocardiogram Bone densitometry screening Colorectal cancer screening Diabetes screening Glaucoma screening Nutrition counseling  Advanced directives: requested   Subjective:  Vicki Hansen is a 66 y.o. female who presents for Medicare Annual Wellness Visit and 3 month follow up. She has Hypertension; Hyperlipidemia, mixed; Abnormal glucose; Vitamin D deficiency; Medication management; Diarrhea; Ulcerative colitis ([place]); and Collagenous colitis on their problem list.  Overall she reports doing well.    Dr. [name] diagnosed with likely mild ulcerative colitis following workup for chronic diarrhea in 11/2018.  She completed an EGD and colonoscopy 09/29/2020.Marland Kitchen She continues to take Entocort at 1 pill once daily and balsalazide 3 pills 3 times daily and 1 Imodium pill once daily. Cost barrier with budesonide.   BMI is Body mass index is 25.39 kg/m., she has been working on  diet and  exercise. Wt Readings from Last 3 Encounters:  10/13/21 134 lb 6.4 oz (61 kg)  10/05/21 135 lb 9.6 oz (61.5 kg)  08/29/21 133 lb 3.2 oz (60.4 kg)   Her blood pressure has been controlled at home, with reporting average around 125/80, today their BP is BP: (!) 140/80 She does workout. She denies chest pain, shortness of breath, dizziness.   She is on cholesterol medication  (simvastatin 20 mg daily) and denies myalgias. Her cholesterol is at goal. The cholesterol last visit was:   Lab Results  Component Value Date   CHOL 176 05/20/2021   HDL 49 (L) 05/20/2021   LDLCALC 91 05/20/2021   TRIG 298 (H) 05/20/2021   CHOLHDL 3.6 05/20/2021    She has been working on diet and exercise for glucose management, and denies increased appetite, nausea, paresthesia of the feet, polydipsia, and polyuria.  Last A1C in the office was:  Lab Results  Component Value Date   HGBA1C 5.4 05/20/2021   Last GFR: Lab Results  Component Value Date   GFRNONAA 90 05/17/2020   Patient is on Vitamin D supplement.   Lab Results  Component Value Date   VD25OH 25 05/20/2021      Medication Review: Current Outpatient Medications on File Prior to Visit  Medication Sig Dispense Refill   aspirin EC 81 MG tablet Take 81 mg by mouth daily.     bisoprolol-hydrochlorothiazide (ZIAC) 10-6.25 MG tablet Take 1 tablet every Morning for BP 90 tablet 1   budesonide (ENTOCORT EC) 3 MG 24 hr capsule Take 1 capsule daily. 30 capsule 6   CALCIUM PO Take 600 mg by mouth daily.     cetirizine (ZYRTEC) 10 MG tablet Take 10 mg by mouth daily.     Cholecalciferol (VITAMIN D PO) Take 5,000 Int'l Units by mouth daily.     cholestyramine (QUESTRAN) 4 g packet MIX AND DRINK 1 PACKET(4 GRAMS) BY MOUTH DAILY 90 each 0   Cyanocobalamin (VITAMIN B-12) 5000 MCG SUBL Place 5,000 mcg under the tongue once a week.     enalapril (VASOTEC) 20 MG tablet TAKE 1 TABLET BY MOUTH EVERY DAY FOR BLOOD PRESSURE 90 tablet 3   Flaxseed Oil (LINSEED  OIL) OIL Take 1,200 mg by mouth 2 (two) times daily.      hyoscyamine (LEVSIN SL) 0.125 MG SL tablet DISSOLVE 1 TABLET UNDER THE TONGUE EVERY 4 HOURS AS NEEDED FOR CRAMPS OR NAUSEA 90 tablet 0   Multiple Vitamin (MULTIVITAMIN) capsule Take 1 capsule by mouth daily.     Probiotic Product (PROBIOTIC DAILY PO) Take by mouth daily.     simvastatin (ZOCOR) 20 MG tablet TAKE 1 TABLET BY MOUTH AT BEDTIME 90 tablet 3   No current facility-administered medications on file prior to visit.    No Known Allergies  Current Problems (verified) Patient Active Problem List   Diagnosis Date Noted   Collagenous colitis 05/20/2021   Ulcerative colitis ([place]) 10/05/2020   Diarrhea 08/19/2018   Medication management 04/24/2014   Hypertension    Hyperlipidemia, mixed    Abnormal glucose    Vitamin D deficiency     Screening Tests Immunization History  Administered Date(s) Administered   Influenza Inj Mdck Quad With Preservative 12/03/2017, 11/13/2018   Influenza, High Dose Seasonal PF 11/05/2020   Janssen (J&J) SARS-COV-2 Vaccination 04/23/2019   PNEUMOCOCCAL CONJUGATE-20 10/06/2020   PPD Test 09/30/2013, 05/13/2019, 05/17/2020   Pfizer Covid-19 Vaccine Bivalent Booster 19yr & up 11/20/2019, 11/05/2020  Pneumococcal-Unspecified 08/27/2008   Tdap 07/18/2007   Zoster, Live 12/29/2015    Preventative care: Last colonoscopy: 09/29/2020, 10 year recall  Last EGD: 09/29/2020  Last mammogram: 02/2021 Due annually Last pap smear/pelvic exam: s/p TAH, done    DEXA: reports had normal in 2019 at Dr. Helane Rima, will plan in 5-10 years  Prior vaccinations: TD or Tdap: 2009, will get with next cut  Influenza: Due 10/2021 Pneumo 20: UTD  Shingles/Zostavax: 2017, hold off shingrix, cost  Covid 19: repots J&J x 2, send dates, encouraged new booster  Names of Other Physician/Practitioners you currently use: 1. [facility] here for primary care 2. Dr. Kathlen Mody, eye  doctor, last visit 2023, glasses, mild cataracts  3. overdue, dentist, last visit remote, encouraged Q6 mo  Patient Care Team: [name], MD as PCP - General (Internal Medicine) Inda Castle, MD (Inactive) as Consulting Physician (Gastroenterology)  SURGICAL HISTORY She  has a past surgical history that includes Cesarean section; Knee arthroscopy (Left, 1972); Tonsillectomy and adenoidectomy; Stapedectomy (Left, 2003); right leg repair (1994); Colonoscopy (03/17/2005); and Robotic assisted total hysterectomy (2015). FAMILY HISTORY Her family history includes Breast cancer in her paternal aunt; Cancer in her father, maternal aunt, maternal uncle, and mother; Colon cancer in her maternal grandfather and maternal grandmother; Colon polyps in her mother; Diabetes in her father; Heart disease in her paternal grandmother; Hypertension in her father and mother; Stomach cancer in her mother; Stroke in her father; Uterine cancer in her sister. SOCIAL HISTORY She  reports that she has never smoked. She has never used smokeless tobacco. She reports that she does not drink alcohol and does not use drugs.   MEDICARE WELLNESS OBJECTIVES: Physical activity:   Cardiac risk factors:   Depression/mood screen:      10/13/2021    9:53 AM  Depression screen PHQ 2/9  Decreased Interest 0  Down, Depressed, Hopeless 0  PHQ - 2 Score 0    ADLs:     10/13/2021    9:51 AM 05/19/2021   11:47 PM  In your present state of health, do you have any difficulty performing the following activities:  Hearing? 0 0  Vision? 0 0  Difficulty concentrating or making decisions? 0 0  Walking or climbing stairs? 1 0  Dressing or bathing? 0 0  Doing errands, shopping? 0 0  Preparing Food and eating ? N   Using the Toilet? N   In the past six months, have you accidently leaked urine? N   Do you have problems with loss of bowel control? N   Managing your Medications? N   Managing your Finances? N   Housekeeping  or managing your Housekeeping? N      Cognitive Testing  Alert? Yes  Normal Appearance?Yes  Oriented to person? Yes  Place? Yes   Time? Yes  Recall of three objects?  Yes  Can perform simple calculations? Yes  Displays appropriate judgment?Yes  Can read the correct time from a watch face?Yes  EOL planning: Does Patient Have a Medical Advance Directive?: Yes Type of Advance Directive: Living will   Review of Systems  Constitutional:  Negative for malaise/fatigue and weight loss.  HENT:  Negative for hearing loss and tinnitus.   Eyes:  Negative for blurred vision and double vision.  Respiratory:  Negative for cough, shortness of breath and wheezing.   Cardiovascular:  Negative for chest pain, palpitations, orthopnea, claudication and leg swelling.  Gastrointestinal:  Positive for diarrhea (chronic, GI  working up, colitis). Negative for abdominal pain, blood in stool, constipation, heartburn, melena, nausea and vomiting.  Genitourinary: Negative.   Musculoskeletal:  Negative for joint pain and myalgias.  Skin:  Negative for rash.  Neurological:  Negative for dizziness, tingling, sensory change, weakness and headaches.  Endo/Heme/Allergies:  Negative for polydipsia.  Psychiatric/Behavioral: Negative.    All other systems reviewed and are negative.    Objective:     Today's Vitals   10/13/21 0933  BP: (!) 140/80  Pulse: 62  Temp: (!) 97.5 F (36.4 C)  SpO2: 99%  Weight: 134 lb 6.4 oz (61 kg)  Height: 5' 1"  (1.549 m)   Body mass index is 25.39 kg/m.  General appearance: alert, no distress, WD/WN, female HEENT: normocephalic, sclerae anicteric, TMs pearly, nares patent, no discharge or erythema, pharynx normal Oral cavity: MMM, no lesions Neck: supple, no lymphadenopathy, no thyromegaly, no masses Heart: RRR, normal S1, S2, no murmurs Lungs: CTA bilaterally, no wheezes, rhonchi, or rales Abdomen: +bs, soft, non tender, non distended, no masses, no hepatomegaly, no  splenomegaly Musculoskeletal: nontender, no swelling, no obvious deformity Extremities: no edema, no cyanosis, no clubbing Pulses: 2+ symmetric, upper and lower extremities, normal cap refill Neurological: alert, oriented x 3, CN2-12 intact, strength normal upper extremities and lower extremities, sensation normal throughout, DTRs 2+ throughout, no cerebellar signs, gait normal Psychiatric: normal affect, behavior normal, pleasant   Medicare Attestation I have personally reviewed: The patient's medical and social history Their use of alcohol, tobacco or illicit drugs Their current medications and supplements The patient's functional ability including ADLs,fall risks, home safety risks, cognitive, and hearing and visual impairment Diet and physical activities Evidence for depression or mood disorders  The patient's weight, height, BMI, and visual acuity have been recorded in the chart.  I have made referrals, counseling, and provided education to the patient based on review of the above and I have provided the patient with a written personalized care plan for preventive services.     Darrol Jump, NP   10/13/2021

## 2021-10-14 LAB — LIPID PANEL
Cholesterol: 162 mg/dL (ref <200)
HDL: 28 mg/dL — ABNORMAL LOW (ref >=50)
Non-HDL Cholesterol (Calc): 134 mg/dL (calc) — ABNORMAL HIGH (ref <130)
Total CHOL/HDL Ratio: 5.8 (calc) — ABNORMAL HIGH (ref <5.0)
Triglycerides: 444 mg/dL — ABNORMAL HIGH (ref <150)

## 2021-11-10 ENCOUNTER — Ambulatory Visit (INDEPENDENT_AMBULATORY_CARE_PROVIDER_SITE_OTHER): Payer: Medicare Other

## 2021-11-10 VITALS — Temp 97.9°F

## 2021-11-10 DIAGNOSIS — Z23 Encounter for immunization: Secondary | ICD-10-CM

## 2021-12-01 ENCOUNTER — Other Ambulatory Visit: Payer: Self-pay | Admitting: Gastroenterology

## 2021-12-01 NOTE — Telephone Encounter (Signed)
Good morning Dr Silverio Decamp,  This is a patient of Dr [name].  I am sending you this refill request as you are DOD am.  Please advise.

## 2021-12-26 ENCOUNTER — Ambulatory Visit: Payer: Medicare Other | Admitting: Internal Medicine

## 2022-01-12 ENCOUNTER — Ambulatory Visit: Payer: Medicare Other | Admitting: Internal Medicine

## 2022-02-13 ENCOUNTER — Other Ambulatory Visit: Payer: Self-pay | Admitting: Internal Medicine

## 2022-02-13 DIAGNOSIS — Z1231 Encounter for screening mammogram for malignant neoplasm of breast: Secondary | ICD-10-CM

## 2022-02-20 NOTE — Progress Notes (Incomplete)
Future Appointments  Date Time Provider Department  02/21/2022               9  mo ov 11:30 AM [name], MD [facility]  05/25/2022                cpe 11:00 AM [name], MD [facility]  08/25/2022               wellness  9:00 AM Darrol Jump, NP [facility]    History of Present Illness:       This very nice 67 y.o.  MWF presents for  9  month follow up with HTN, HLD, Pre-Diabetes and Vitamin D Deficiency. Patient follows closely with Dr Loletha Carrow for her Collagenous  Colitis.        Patient is treated for HTN (2010) & BP has been controlled at home. Today's BP is at goal -                                 . Patient has had no complaints of any cardiac type chest pain, palpitations, dyspnea / orthopnea / PND, dizziness, claudication, or dependent edema.        Hyperlipidemia is controlled with diet & Simvastatin. Patient denies myalgias or other med SE's. Last Lipids were at goal except elevated Trig's:  Lab Results  Component Value Date   CHOL 162 10/13/2021   HDL 28 (L) 10/13/2021   LDLCALC not calculated 10/13/2021   TRIG 444 (H) 10/13/2021   CHOLHDL 5.8 (H) 10/13/2021     Also, the patient has history of PreDiabetes (A1c 5.8% /2014) and has had no symptoms of reactive hypoglycemia, diabetic polys, paresthesias or visual blurring.  Last A1c was Normal & at goal:  Lab Results  Component Value Date   HGBA1C 5.4 05/20/2021         Further, the patient also has history of Vitamin D Deficiency and supplements vitamin D without any suspected side-effects. Last vitamin D was at goal:  Lab Results  Component Value Date   VD25OH 81 05/20/2021        Current Outpatient Medications  Medication Instructions  . aspirin EC 81 mg , Oral, Daily  . bisoprolol-hctz 10-6.25 MG tablet Take 1 tablet every Morning  . budesonide (ENTOCORT EC) 3 MG 24 hr capsule Take 1 capsule daily.  Marland Kitchen CALCIUM 600 mg , Daily  . cetirizine (ZYRTEC)10 mg , Daily  . Cholecalciferol  (VITAMIN D PO) 5,000 Units, Oral, Daily  . cholestyramine (QUESTRAN) 4 g packet MIX AND DRINK 1 PACKET(4 GRAMS)  DAILY  . enalapril (VASOTEC) 20 MG tablet TAKE 1 TABLET EVERY DAY  . Flaxseed Oil (LINSEED OIL) OIL 1,200 mg, Oral, 2 times daily  . hyoscyamine SL) 0.125 MG  DISSOLVE 1 TABLET UNDER THE TONGUE EVERY 4 HOURS AS NEEDED FOR CRAMPS OR NAUSEA  . Multiple Vitamin  1 capsule, Daily  . PROBIOTIC Oral, Daily  . simvastatin 20 MG tablet TAKE 1 TABLET AT BEDTIME  . Vitamin B-12 5,000 mcg, Sublingual, Weekly     No Known Allergies  PMHx:   Past Medical History:  Diagnosis Date  . Allergy   . Arthritis    BACK   . Cancer (HCC)    UTERINE   . Cataract    MILD  . GERD (gastroesophageal reflux disease)   . Hyperlipidemia   . Hypertension   .  Iritis    HLA B27 +  . Prediabetes   . S/P ORIF (open reduction internal fixation) fracture 1994   Right tib/fib  . Thyroiditis, subacute   . Vitamin D deficiency     Immunization History  Administered Date(s) Administered  . Influenza Inj Mdck Quad With Preservative 12/03/2017, 11/13/2018  . Janssen (J&J) SARS-COV-2 Vaccination 04/23/2019  . PPD Test 09/30/2013, 05/13/2019  . Pneumococcal-Unspecified 08/27/2008  . Tdap 07/18/2007  . Zoster 12/29/2015    Past Surgical History:  Procedure Laterality Date  . CESAREAN SECTION    . COLONOSCOPY  03/17/2005   HEMS   . KNEE ARTHROSCOPY Left 1972  . right leg repair  1994   rod   . ROBOTIC ASSISTED TOTAL HYSTERECTOMY  2015   pt. states she had robot assisted hysterectomy and uterus was removed abdominally due to scar tissue.  she also states that both tubes and ovaries were removed.  . STAPEDECTOMY Left 2003  . TONSILLECTOMY AND ADENOIDECTOMY      FHx:    Reviewed / unchanged  SHx:    Reviewed / unchanged   Systems Review:  Constitutional: Denies fever, chills, wt changes, headaches, insomnia, fatigue, night sweats, change in appetite. Eyes: Denies redness, blurred vision,  diplopia, discharge, itchy, watery eyes.  ENT: Denies discharge, congestion, post nasal drip, epistaxis, sore throat, earache, hearing loss, dental pain, tinnitus, vertigo, sinus pain, snoring.  CV: Denies chest pain, palpitations, irregular heartbeat, syncope, dyspnea, diaphoresis, orthopnea, PND, claudication or edema. Respiratory: denies cough, dyspnea, DOE, pleurisy, hoarseness, laryngitis, wheezing.  Gastrointestinal: Denies dysphagia, odynophagia, heartburn, reflux, water brash, abdominal pain or cramps, nausea, vomiting, bloating, diarrhea, constipation, hematemesis, melena, hematochezia  or hemorrhoids. Genitourinary: Denies dysuria, frequency, urgency, nocturia, hesitancy, discharge, hematuria or flank pain. Musculoskeletal: Denies arthralgias, myalgias, stiffness, jt. swelling, pain, limping or strain/sprain.  Skin: Denies pruritus, rash, hives, warts, acne, eczema or change in skin lesion(s). Neuro: No weakness, tremor, incoordination, spasms, paresthesia or pain. Psychiatric: Denies confusion, memory loss or sensory loss. Endo: Denies change in weight, skin or hair change.  Heme/Lymph: No excessive bleeding, bruising or enlarged lymph nodes.  Physical Exam  There were no vitals taken for this visit.  Appears  well nourished, well groomed  and in no distress.  Eyes: PERRLA, EOMs, conjunctiva no swelling or erythema. Sinuses: No frontal/maxillary tenderness ENT/Mouth: EAC's clear, TM's nl w/o erythema, bulging. Nares clear w/o erythema, swelling, exudates. Oropharynx clear without erythema or exudates. Oral hygiene is good. Tongue normal, non obstructing. Hearing intact.  Neck: Supple. Thyroid not palpable. Car 2+/2+ without bruits, nodes or JVD. Chest: Respirations nl with BS clear & equal w/o rales, rhonchi, wheezing or stridor.  Cor: Heart sounds normal w/ regular rate and rhythm without sig. murmurs, gallops, clicks or rubs. Peripheral pulses normal and equal  without edema.   Abdomen: Soft & bowel sounds normal. Non-tender w/o guarding, rebound, hernias, masses or organomegaly.  Lymphatics: Unremarkable.  Musculoskeletal: Full ROM all peripheral extremities, joint stability, 5/5 strength and normal gait.  Skin: Warm, dry without exposed rashes, lesions or ecchymosis apparent.  Neuro: Cranial nerves intact, reflexes equal bilaterally. Sensory-motor testing grossly intact. Tendon reflexes grossly intact.  Pysch: Alert & oriented x 3.  Insight and judgement nl & appropriate. No ideations.  Assessment and Plan:   - Continue medication, monitor blood pressure at home.  - Continue DASH diet.  Reminder to go to the ER if any CP,  SOB, nausea, dizziness, severe HA, changes vision/speech.    - Continue diet/meds,  exercise,& lifestyle modifications.  - Continue monitor periodic cholesterol/liver & renal functions     - Continue diet, exercise  - Lifestyle modifications.  - Monitor appropriate labs.    - Continue supplementation.            Discussed  regular exercise, BP monitoring, weight control to achieve/maintain BMI less than 25 and discussed med and SE's. Recommended labs to assess and monitor clinical status with further disposition pending results of labs.  I discussed the assessment and treatment plan with the patient. The patient was provided an opportunity to ask questions and all were answered. The patient agreed with the plan and demonstrated an understanding of the instructions.  I provided over 30 minutes of exam, counseling, chart review and  complex critical decision making.         The patient was advised to call back or seek an in-person evaluation if the symptoms worsen or if the condition fails to improve as anticipated.   Kirtland Bouchard, MD

## 2022-02-20 NOTE — Progress Notes (Signed)
Future Appointments  Date Time Provider Department  02/21/2022               9  mo ov 11:30 AM [name], MD [facility]  05/25/2022                cpe 11:00 AM [name], MD [facility]  08/25/2022               wellness  9:00 AM Darrol Jump, NP [facility]    History of Present Illness:       This very nice 67 y.o.  MWF presents for  9  month follow up with HTN, HLD, Pre-Diabetes and Vitamin D Deficiency. Patient follows closely with Dr Loletha Carrow for her Collagenous  Colitis.        Patient is treated for HTN (2010) & BP has been controlled at home. Today's BP is at goal -  130/74. Patient has had no complaints of any cardiac type chest pain, palpitations, dyspnea / orthopnea / PND, dizziness, claudication, or dependent edema.        Hyperlipidemia is controlled with diet & Simvastatin. Patient denies myalgias or other med SE's. Last Lipids were at goal except elevated Trig's:  Lab Results  Component Value Date   CHOL 162 10/13/2021   HDL 28 (L) 10/13/2021   LDLCALC not calculated 10/13/2021   TRIG 444 (H) 10/13/2021   CHOLHDL 5.8 (H) 10/13/2021     Also, the patient has history of PreDiabetes (A1c 5.8% /2014) and has had no symptoms of reactive hypoglycemia, diabetic polys, paresthesias or visual blurring.  Last A1c was Normal & at goal:  Lab Results  Component Value Date   HGBA1C 5.4 05/20/2021         Further, the patient also has history of Vitamin D Deficiency and supplements vitamin D without any suspected side-effects. Last vitamin D was at goal:  Lab Results  Component Value Date   VD25OH 81 05/20/2021        Current Outpatient Medications  Medication Instructions   aspirin EC 81 mg , Oral, Daily   bisoprolol-hctz 10-6.25 MG tablet Take 1 tablet every Morning   budesonide (ENTOCORT EC) 3 MG 24 hr capsule Take 1 capsule daily.   CALCIUM 600 mg , Daily   cetirizine (ZYRTEC)10 mg , Daily   Cholecalciferol (VITAMIN D PO) 5,000 Units, Oral,  Daily   cholestyramine (QUESTRAN) 4 g packet MIX AND DRINK 1 PACKET(4 GRAMS)  DAILY   enalapril (VASOTEC) 20 MG tablet TAKE 1 TABLET EVERY DAY   Flaxseed Oil (LINSEED OIL) OIL 1,200 mg, Oral, 2 times daily   hyoscyamine SL) 0.125 MG  DISSOLVE 1 TABLET UNDER THE TONGUE EVERY 4 HOURS AS NEEDED FOR CRAMPS OR NAUSEA   Multiple Vitamin  1 capsule, Daily   PROBIOTIC Oral, Daily   simvastatin 20 MG tablet TAKE 1 TABLET AT BEDTIME   Vitamin B-12 5,000 mcg, Sublingual, Weekly     No Known Allergies  PMHx:   Past Medical History:  Diagnosis Date   Allergy    Arthritis    BACK    Cancer ([facility])    UTERINE    Cataract    MILD   GERD (gastroesophageal reflux disease)    Hyperlipidemia    Hypertension    Iritis    HLA B27 +   Prediabetes    S/P ORIF (open reduction internal fixation) fracture 1994   Right tib/fib   Thyroiditis, subacute  Vitamin D deficiency     Immunization History  Administered Date(s) Administered   Influenza Inj Mdck Quad With Preservative 12/03/2017, 11/13/2018   Janssen (J&J) SARS-COV-2 Vaccination 04/23/2019   PPD Test 09/30/2013, 05/13/2019   Pneumococcal-Unspecified 08/27/2008   Tdap 07/18/2007   Zoster 12/29/2015    Past Surgical History:  Procedure Laterality Date   CESAREAN SECTION     COLONOSCOPY  03/17/2005   HEMS    KNEE ARTHROSCOPY Left 1972   right leg repair  1994   rod    ROBOTIC ASSISTED TOTAL HYSTERECTOMY  2015   pt. states she had robot assisted hysterectomy and uterus was removed abdominally due to scar tissue.  she also states that both tubes and ovaries were removed.   STAPEDECTOMY Left 2003   TONSILLECTOMY AND ADENOIDECTOMY      FHx:    Reviewed / unchanged  SHx:    Reviewed / unchanged   Systems Review:  Constitutional: Denies fever, chills, wt changes, headaches, insomnia, fatigue, night sweats, change in appetite. Eyes: Denies redness, blurred vision, diplopia, discharge, itchy, watery eyes.  ENT: Denies discharge,  congestion, post nasal drip, epistaxis, sore throat, earache, hearing loss, dental pain, tinnitus, vertigo, sinus pain, snoring.  CV: Denies chest pain, palpitations, irregular heartbeat, syncope, dyspnea, diaphoresis, orthopnea, PND, claudication or edema. Respiratory: denies cough, dyspnea, DOE, pleurisy, hoarseness, laryngitis, wheezing.  Gastrointestinal: Denies dysphagia, odynophagia, heartburn, reflux, water brash, abdominal pain or cramps, nausea, vomiting, bloating, diarrhea, constipation, hematemesis, melena, hematochezia  or hemorrhoids. Genitourinary: Denies dysuria, frequency, urgency, nocturia, hesitancy, discharge, hematuria or flank pain. Musculoskeletal: Denies arthralgias, myalgias, stiffness, jt. swelling, pain, limping or strain/sprain.  Skin: Denies pruritus, rash, hives, warts, acne, eczema or change in skin lesion(s). Neuro: No weakness, tremor, incoordination, spasms, paresthesia or pain. Psychiatric: Denies confusion, memory loss or sensory loss. Endo: Denies change in weight, skin or hair change.  Heme/Lymph: No excessive bleeding, bruising or enlarged lymph nodes.  Physical Exam  BP 130/74   Pulse 70   Temp 97.9 F (36.6 C)   Resp 17   Ht 5' 1"$  (1.549 m)   Wt 133 lb 12.8 oz (60.7 kg)   SpO2 96%   BMI 25.28 kg/m   Appears  well nourished, well groomed  and in no distress.  Eyes: PERRLA, EOMs, conjunctiva no swelling or erythema. Sinuses: No frontal/maxillary tenderness ENT/Mouth: EAC's clear, TM's nl w/o erythema, bulging. Nares clear w/o erythema, swelling, exudates. Oropharynx clear without erythema or exudates. Oral hygiene is good. Tongue normal, non obstructing. Hearing intact.  Neck: Supple. Thyroid not palpable. Car 2+/2+ without bruits, nodes or JVD. Chest: Respirations nl with BS clear & equal w/o rales, rhonchi, wheezing or stridor.  Cor: Heart sounds normal w/ regular rate and rhythm without sig. murmurs, gallops, clicks or rubs. Peripheral pulses  normal and equal  without edema.  Abdomen: Soft & bowel sounds normal. Non-tender w/o guarding, rebound, hernias, masses or organomegaly.  Lymphatics: Unremarkable.  Musculoskeletal: Full ROM all peripheral extremities, joint stability, 5/5 strength and normal gait.  Skin: Warm, dry without exposed rashes, lesions or ecchymosis apparent.  Neuro: Cranial nerves intact, reflexes equal bilaterally. Sensory-motor testing grossly intact. Tendon reflexes grossly intact.  Pysch: Alert & oriented x 3.  Insight and judgement nl & appropriate. No ideations.  Assessment and Plan:  1. Labile hypertension  - Continue medication, monitor blood pressure at home.  - Continue DASH diet.  Reminder to go to the ER if any CP,  SOB, nausea, dizziness, severe HA, changes vision/speech.    -  CBC with Differential/Platelet - COMPLETE METABOLIC PANEL WITH GFR - Magnesium - TSH  2. Hyperlipidemia, mixed  - Continue diet/meds, exercise,& lifestyle modifications.  - Continue monitor periodic cholesterol/liver & renal functions     - Lipid panel - TSH  3. Abnormal glucose  Continue diet, exercise  - Lifestyle modifications.  - Monitor appropriate labs     - Hemoglobin A1c - Insulin, random  4. Vitamin D deficiency  - Continue supplementation    - VITAMIN D 25 Hydroxy   5. Collagenous colitis  - CBC with Differential/Platelet - Sedimentation rate  6. Medication management  - CBC with Differential/Platelet - COMPLETE METABOLIC PANEL WITH GFR - Magnesium - Lipid panel - TSH - Hemoglobin A1c - Insulin, random - VITAMIN D 25 Hydroxy  - Sedimentation rate          Discussed  regular exercise, BP monitoring, weight control to achieve/maintain BMI less than 25 and discussed med and SE's. Recommended labs to assess and monitor clinical status with further disposition pending results of labs.  I discussed the assessment and treatment plan with the patient. The patient was provided an  opportunity to ask questions and all were answered. The patient agreed with the plan and demonstrated an understanding of the instructions.  I provided over 30 minutes of exam, counseling, chart review and  complex critical decision making.         The patient was advised to call back or seek an in-person evaluation if the symptoms worsen or if the condition fails to improve as anticipated.   Kirtland Bouchard, MD

## 2022-02-21 ENCOUNTER — Encounter: Payer: Self-pay | Admitting: Internal Medicine

## 2022-02-21 ENCOUNTER — Ambulatory Visit (INDEPENDENT_AMBULATORY_CARE_PROVIDER_SITE_OTHER): Payer: Medicare Other | Admitting: Internal Medicine

## 2022-02-21 VITALS — BP 130/74 | HR 70 | Temp 97.9°F | Resp 17 | Ht 61.0 in | Wt 133.8 lb

## 2022-02-21 DIAGNOSIS — Z79899 Other long term (current) drug therapy: Secondary | ICD-10-CM

## 2022-02-21 DIAGNOSIS — E559 Vitamin D deficiency, unspecified: Secondary | ICD-10-CM | POA: Diagnosis not present

## 2022-02-21 DIAGNOSIS — K52831 Collagenous colitis: Secondary | ICD-10-CM

## 2022-02-21 DIAGNOSIS — R7309 Other abnormal glucose: Secondary | ICD-10-CM | POA: Diagnosis not present

## 2022-02-21 DIAGNOSIS — R109 Unspecified abdominal pain: Secondary | ICD-10-CM

## 2022-02-21 DIAGNOSIS — E782 Mixed hyperlipidemia: Secondary | ICD-10-CM | POA: Diagnosis not present

## 2022-02-21 DIAGNOSIS — R0989 Other specified symptoms and signs involving the circulatory and respiratory systems: Secondary | ICD-10-CM | POA: Diagnosis not present

## 2022-02-21 DIAGNOSIS — R197 Diarrhea, unspecified: Secondary | ICD-10-CM

## 2022-02-21 MED ORDER — HYOSCYAMINE SULFATE 0.125 MG SL SUBL: SUBLINGUAL_TABLET | SUBLINGUAL | 0 refills | Status: DC

## 2022-02-21 MED ORDER — ONDANSETRON HCL 8 MG PO TABS: ORAL_TABLET | ORAL | 0 refills | Status: DC

## 2022-02-21 MED ORDER — PROBIOTIC BLEND PO CAPS: ORAL_CAPSULE | ORAL | Status: AC

## 2022-02-21 NOTE — Patient Instructions (Signed)

## 2022-02-22 LAB — COMPLETE METABOLIC PANEL WITH GFR
AG Ratio: 2.1 (calc) (ref 1.0–2.5)
ALT: 17 U/L (ref 6–29)
AST: 19 U/L (ref 10–35)
Albumin: 4.4 g/dL (ref 3.6–5.1)
Alkaline phosphatase (APISO): 78 U/L (ref 37–153)
BUN: 17 mg/dL (ref 7–25)
CO2: 25 mmol/L (ref 20–32)
Calcium: 10 mg/dL (ref 8.6–10.4)
Chloride: 106 mmol/L (ref 98–110)
Creat: 0.73 mg/dL (ref 0.50–1.05)
Globulin: 2.1 g/dL (calc) (ref 1.9–3.7)
Glucose, Bld: 111 mg/dL — ABNORMAL HIGH (ref 65–99)
Potassium: 4.1 mmol/L (ref 3.5–5.3)
Sodium: 139 mmol/L (ref 135–146)
Total Bilirubin: 0.4 mg/dL (ref 0.2–1.2)
Total Protein: 6.5 g/dL (ref 6.1–8.1)
eGFR: 91 mL/min/{1.73_m2} (ref >=60)

## 2022-02-22 LAB — CBC WITH DIFFERENTIAL/PLATELET
Absolute Monocytes: 564 cells/uL (ref 200–950)
Basophils Absolute: 39 cells/uL (ref 0–200)
Basophils Relative: 0.8 %
Eosinophils Absolute: 29 cells/uL (ref 15–500)
Eosinophils Relative: 0.6 %
HCT: 40.7 % (ref 35.0–45.0)
Hemoglobin: 13.9 g/dL (ref 11.7–15.5)
Lymphs Abs: 2122 cells/uL (ref 850–3900)
MCH: 30.8 pg (ref 27.0–33.0)
MCHC: 34.2 g/dL (ref 32.0–36.0)
MCV: 90 fL (ref 80.0–100.0)
MPV: 10.2 fL (ref 7.5–12.5)
Monocytes Relative: 11.5 %
Neutro Abs: 2146 cells/uL (ref 1500–7800)
Neutrophils Relative %: 43.8 %
Platelets: 263 10*3/uL (ref 140–400)
RBC: 4.52 10*6/uL (ref 3.80–5.10)
RDW: 12.2 % (ref 11.0–15.0)
Total Lymphocyte: 43.3 %
WBC: 4.9 10*3/uL (ref 3.8–10.8)

## 2022-02-22 LAB — TSH: TSH: 1.15 mIU/L (ref 0.40–4.50)

## 2022-02-22 LAB — LIPID PANEL
Cholesterol: 141 mg/dL (ref <200)
HDL: 33 mg/dL — ABNORMAL LOW (ref >=50)
LDL Cholesterol (Calc): 75 mg/dL (calc)
Non-HDL Cholesterol (Calc): 108 mg/dL (calc) (ref <130)
Total CHOL/HDL Ratio: 4.3 (calc) (ref <5.0)
Triglycerides: 252 mg/dL — ABNORMAL HIGH (ref <150)

## 2022-02-22 LAB — VITAMIN D 25 HYDROXY (VIT D DEFICIENCY, FRACTURES): Vit D, 25-Hydroxy: 144 ng/mL — ABNORMAL HIGH (ref 30–100)

## 2022-02-22 LAB — HEMOGLOBIN A1C
Hgb A1c MFr Bld: 6 % of total Hgb — ABNORMAL HIGH (ref <5.7)
Mean Plasma Glucose: 126 mg/dL
eAG (mmol/L): 7 mmol/L

## 2022-02-22 LAB — MAGNESIUM: Magnesium: 2.1 mg/dL (ref 1.5–2.5)

## 2022-02-22 LAB — SEDIMENTATION RATE: Sed Rate: 6 mm/h (ref 0–30)

## 2022-02-22 LAB — INSULIN, RANDOM: Insulin: 31.8 u[IU]/mL — ABNORMAL HIGH

## 2022-02-22 NOTE — Progress Notes (Signed)
<><><><><><><><><><><><><><><><><><><><><><><><><><><><><><><><><> <><><><><><><><><><><><><><><><><><><><><><><><><><><><><><><><><> - Test results slightly outside the reference range are not unusual. If there is anything important, I will review this with you,  otherwise it is considered normal test values.  If you have further questions,  please do not hesitate to contact me at the office or via My Chart.  <><><><><><><><><><><><><><><><><><><><><><><><><><><><><><><><><> <><><><><><><><><><><><><><><><><><><><><><><><><><><><><><><><><>  -  Chol = 141    &   LDL Chol = 75   - Both  Excellent   - Very low risk for Heart Attack  / Stroke - Please continue Zocor / Simvastatin  <><><><><><><><><><><><><><><><><><><><><><><><><><><><><><><><><> <><><><><><><><><><><><><><><><><><><><><><><><><><><><><><><><><>  -   But  Triglycerides (   252    ) or fats in blood are too high                 (   Ideal or  Goal is less than 150  !  )    - Recommend avoid fried & greasy foods,  sweets / candy,   - Avoid white rice  (brown or wild rice or Quinoa is OK),   - Avoid white potatoes  (sweet potatoes are OK)   - Avoid anything made from white flour  - bagels, doughnuts, rolls, buns, biscuits, white and   wheat breads, pizza crust and traditional  pasta made of white flour & egg white  - (vegetarian pasta or spinach or wheat pasta is OK).    - Multi-grain bread is OK - like multi-grain flat bread or  sandwich thins.   - Avoid alcohol in excess.   - Exercise is also important. <><><><><><><><><><><><><><><><><><><><><><><><><><><><><><><><><> <><><><><><><><><><><><><><><><><><><><><><><><><><><><><><><><><>  - A1c = 6.0%    Blood sugar and A1c are Now  elevated in the borderline and                                                            early or pre-diabetes range which has the same   300% increased risk for heart attack, stroke, cancer and                                             alzheimer- type vascular dementia as full blown diabetes.   But the good news is that diet, exercise with weight loss can                                                                              cure the early diabetes at this point.   -    It is very important that you work harder with diet by                                avoiding all foods that are white except chicken, fish & calliflower.  - Avoid white rice  (brown & wild rice is OK),   - Avoid white potatoes  (sweet potatoes in  moderation is OK),   White bread or wheat bread or anything made out of   white flour like bagels, donuts, rolls, buns, biscuits, cakes,  - pastries, cookies, pizza crust, and pasta (made from  white flour & egg whites)   - vegetarian pasta or spinach or wheat pasta is OK.  - Multigrain breads like Arnold's, Pepperidge Farm or   multigrain sandwich thins or high fiber breads like   Eureka bread or "Dave's Killer" breads that are  4 to 5 grams fiber per slice !  are best.    Diet, exercise and weight loss can reverse and cure                                                                                     diabetes in the early stages.   <><><><><><><><><><><><><><><><><><><><><><><><><><><><><><><><><> <><><><><><><><><><><><><><><><><><><><><><><><><><><><><><><><><>  -  Vitamin D = 144   - is way too high   !   Stop  Vitamin D  for 1 week   !  - Then can restart & only take every other  day, for example take even days of month   <><><><><><><><><><><><><><><><><><><><><><><><><><><><><><><><><> <><><><><><><><><><><><><><><><><><><><><><><><><><><><><><><><><>  -  All Else - CBC - Kidneys - Electrolytes - Liver - Magnesium & Thyroid    - all  Normal / OK <><><><><><><><><><><><><><><><><><><><><><><><><><><><><><><><><> <><><><><><><><><><><><><><><><><><><><><><><><><><><><><><><><><>         \

## 2022-02-26 ENCOUNTER — Encounter: Payer: Self-pay | Admitting: Internal Medicine

## 2022-03-03 ENCOUNTER — Other Ambulatory Visit: Payer: Self-pay | Admitting: Internal Medicine

## 2022-03-09 ENCOUNTER — Encounter: Payer: Self-pay | Admitting: Nurse Practitioner

## 2022-03-09 ENCOUNTER — Ambulatory Visit (INDEPENDENT_AMBULATORY_CARE_PROVIDER_SITE_OTHER): Payer: Medicare Other | Admitting: Nurse Practitioner

## 2022-03-09 VITALS — BP 122/72 | HR 67 | Temp 97.9°F | Ht 61.0 in | Wt 134.6 lb

## 2022-03-09 DIAGNOSIS — I10 Essential (primary) hypertension: Secondary | ICD-10-CM | POA: Diagnosis not present

## 2022-03-09 DIAGNOSIS — R6889 Other general symptoms and signs: Secondary | ICD-10-CM | POA: Diagnosis not present

## 2022-03-09 DIAGNOSIS — J029 Acute pharyngitis, unspecified: Secondary | ICD-10-CM

## 2022-03-09 DIAGNOSIS — Z1152 Encounter for screening for COVID-19: Secondary | ICD-10-CM

## 2022-03-09 LAB — POCT INFLUENZA A/B
Influenza A, POC: NEGATIVE
Influenza B, POC: NEGATIVE

## 2022-03-09 LAB — POC COVID19 BINAXNOW: SARS Coronavirus 2 Ag: NEGATIVE

## 2022-03-09 MED ORDER — PROMETHAZINE-DM 6.25-15 MG/5ML PO SYRP: 5.0000 mL | ORAL_SOLUTION | Freq: Four times a day (QID) | ORAL | 1 refills | Status: DC | PRN

## 2022-03-09 MED ORDER — DEXAMETHASONE 1 MG PO TABS: ORAL_TABLET | ORAL | 0 refills | Status: DC

## 2022-03-09 MED ORDER — AMOXICILLIN 500 MG PO TABS: 500.0000 mg | ORAL_TABLET | Freq: Three times a day (TID) | ORAL | 0 refills | Status: DC

## 2022-03-09 NOTE — Patient Instructions (Signed)

## 2022-03-09 NOTE — Progress Notes (Signed)
Assessment and Plan:  Vicki Hansen was seen today for sore throat.  Diagnoses and all orders for this visit:  Essential hypertension - continue medications, DASH diet, exercise and monitor at home. Call if greater than 130/80.   Encounter for screening for COVID-19 -     POC COVID-19 - negative  Flu-like symptoms -     POCT Influenza A/B- negative  Pharyngitis, unspecified etiology Use Mucinex and Tylenol as needed Push fluids If no improvement in the next 5 days notify the office -     promethazine-dextromethorphan (PROMETHAZINE-DM) 6.25-15 MG/5ML syrup; Take 5 mLs by mouth 4 (four) times daily as needed for cough. -     amoxicillin (AMOXIL) 500 MG tablet; Take 1 tablet (500 mg total) by mouth 3 (three) times daily. 7days -     dexamethasone (DECADRON) 1 MG tablet; Take 3 tabs for 3 days, 2 tabs for 3 days 1 tab for 5 days. Take with food.       Further disposition pending results of labs. Discussed med's effects and SE's.   Over 30 minutes of exam, counseling, chart review, and critical decision making was performed.   Future Appointments  Date Time Provider [place]  03/22/2022  1:00 PM [facility] 2 [facility]  05/25/2022 11:00 AM [name], MD [facility] None  08/25/2022  9:00 AM Darrol Jump, NP [facility] None    ------------------------------------------------------------------------------------------------------------------   HPI BP 122/72   Pulse 67   Temp 97.9 F (36.6 C)   Ht 5' 1"$  (1.549 m)   Wt 134 lb 9.6 oz (61.1 kg)   SpO2 97%   BMI 25.43 kg/m    67 y.o.female presents for sore throat/hoarseness, body aches, chills, dry cough,"food has a funny taste". Denies congestion, headaches.  Symptoms have been present for 2 days.    BP is currently well controlled on Ziac 10/6.25 mg QD and enalapril 20 mg QD .  Denies headaches, chest pain, shortness of breath and dizziness.  BP Readings from Last 3 Encounters:  03/09/22 122/72  02/21/22  130/74  10/13/21 (!) 140/80   BMI is Body mass index is 25.43 kg/m. Wt Readings from Last 3 Encounters:  03/09/22 134 lb 9.6 oz (61.1 kg)  02/21/22 133 lb 12.8 oz (60.7 kg)  10/13/21 134 lb 6.4 oz (61 kg)     Past Medical History:  Diagnosis Date   Allergy    Arthritis    BACK    Bell's palsy    Cancer (HCC)    UTERINE    Cataract    MILD   GERD (gastroesophageal reflux disease)    Hyperlipidemia    Hypertension    Iritis    HLA B27 +   Prediabetes    S/P ORIF (open reduction internal fixation) fracture 1994   Right tib/fib   Thyroiditis, subacute    Vitamin D deficiency      No Active Allergies  Current Outpatient Medications on File Prior to Visit  Medication Sig   Acetaminophen (TYLENOL PO) Take by mouth.   aspirin EC 81 MG tablet Take 81 mg by mouth daily.   Aspirin Effervescent (ALKA-SELTZER PO) Take by mouth.   bisoprolol-hydrochlorothiazide (ZIAC) 10-6.25 MG tablet TAKE 1 TABLET BY MOUTH EVERY MORNING FOR BLOOD PRESSURE   CALCIUM PO Take 600 mg by mouth daily.   cetirizine (ZYRTEC) 10 MG tablet Take 10 mg by mouth daily.   Cholecalciferol (VITAMIN D PO) Take 5,000 Int'l Units by mouth daily. Tuesday, Thursday, Saturday  Cyanocobalamin (VITAMIN B-12) 5000 MCG SUBL Place 5,000 mcg under the tongue once a week.   enalapril (VASOTEC) 20 MG tablet TAKE 1 TABLET BY MOUTH EVERY DAY FOR BLOOD PRESSURE   guaiFENesin (MUCINEX PO) Take by mouth.   hyoscyamine (LEVSIN SL) 0.125 MG SL tablet DISSOLVE 1 TABLET UNDER THE TONGUE EVERY 4 HOURS AS NEEDED FOR CRAMPS OR NAUSEA   Multiple Vitamin (MULTIVITAMIN) capsule Take 1 capsule by mouth daily.   ondansetron (ZOFRAN) 8 MG tablet Take 1/2 to 1 tablet 3 x / day as needed for Nausea or Diarrhea   Probiotic Product (PROBIOTIC BLEND) CAPS Takes 1 daily   (Ultimate Blend Probiotic 150 Billion)   simvastatin (ZOCOR) 20 MG tablet TAKE 1 TABLET BY MOUTH AT BEDTIME   No current facility-administered medications on file prior to  visit.    ROS: all negative except above.   Physical Exam:  BP 122/72   Pulse 67   Temp 97.9 F (36.6 C)   Ht 5' 1"$  (1.549 m)   Wt 134 lb 9.6 oz (61.1 kg)   SpO2 97%   BMI 25.43 kg/m   General Appearance: Well nourished, ill appearance Eyes: PERRLA, EOMs, conjunctiva no swelling or erythema Sinuses: No Frontal/maxillary tenderness ENT/Mouth: Post pharynx erythematous, no exudate, TM no bulging or erythema Neck: Supple, thyroid normal.  Respiratory: Respiratory effort normal, BS equal bilaterally without rales, rhonchi, wheezing or stridor. Dry cough throughout visit Cardio: RRR with no MRGs. Brisk peripheral pulses without edema.  Abdomen: Soft, + BS.  Non tender, no guarding, rebound, hernias, masses. Lymphatics: Positive submandibular adenopathy bilaterally Musculoskeletal: Full ROM, 5/5 strength, normal gait.  Skin: Warm, dry without rashes, lesions, ecchymosis.  Neuro: Cranial nerves intact. Normal muscle tone, no cerebellar symptoms. Sensation intact.  Psych: Awake and oriented X 3, normal affect, Insight and Judgment appropriate.     Alycia Rossetti, NP 3:53 PM [facility]

## 2022-03-22 ENCOUNTER — Ambulatory Visit
Admission: RE | Admit: 2022-03-22 | Discharge: 2022-03-22 | Disposition: A | Discharge status: Home / Self Care | Payer: Medicare Other | Source: Ambulatory Visit | Attending: Internal Medicine | Admitting: Internal Medicine

## 2022-03-22 DIAGNOSIS — Z1231 Encounter for screening mammogram for malignant neoplasm of breast: Secondary | ICD-10-CM

## 2022-03-29 ENCOUNTER — Telehealth: Payer: Self-pay

## 2022-03-29 NOTE — Telephone Encounter (Signed)
The patient called to report that she has been taking the imodium as directed and her loose stools have slowed down some. She reports that food nor drink taste good and she has slowed down intake on both. She reports she has lost 13 pounds. However, her biggest concern today is her blood pressure which is 82/50 at this time and she is inquiring if she should take her blood pressure medication. The patient was instructed to hydrate well with Gatorade and/or Pedialyte, to recheck her blood pressure and to hold her blood pressure medication tonight. She was instructed to retake her blood pressure after consumption of the Gatorade and/or Pedialyte and if her blood pressure does not raise significantly and/or her symptoms worsen that she should immediately go to the emergency room or call 911 for evaluation. The patient was instructed that if her blood pressure rebounds and raises after hydration to recheck in the morning and call this clinic staff with her reading. At that point she will be instructed as to when she should resume her blood pressure medication. The patient was instructed if her blood pressure rose to above normal readings after hydration and continue to rise inappropriately high that she should resume one dose of the blood pressure medication. Again, she was instructed to call 911 or go to her closest emergency room for evaluation and treatment for any lower blood pressures or concerning symptoms. The patient voiced verbal understanding to this staff member.

## 2022-04-02 ENCOUNTER — Other Ambulatory Visit: Payer: Self-pay | Admitting: Internal Medicine

## 2022-04-02 DIAGNOSIS — R109 Unspecified abdominal pain: Secondary | ICD-10-CM

## 2022-04-02 DIAGNOSIS — R197 Diarrhea, unspecified: Secondary | ICD-10-CM

## 2022-04-02 MED ORDER — HYOSCYAMINE SULFATE 0.125 MG SL SUBL: SUBLINGUAL_TABLET | SUBLINGUAL | 1 refills | Status: DC

## 2022-04-03 ENCOUNTER — Encounter: Payer: Self-pay | Admitting: Internal Medicine

## 2022-04-03 NOTE — Progress Notes (Unsigned)
Assessment and Plan:  There are no diagnoses linked to this encounter.    Further disposition pending results of labs. Discussed med's effects and SE's.   Over 30 minutes of exam, counseling, chart review, and critical decision making was performed.   Future Appointments  Date Time Provider [place]  04/04/2022 10:00 AM Alycia Rossetti, NP [facility] None  05/25/2022 11:00 AM [name], MD [facility] None  08/25/2022  9:00 AM Darrol Jump, NP [facility] None    ------------------------------------------------------------------------------------------------------------------   HPI There were no vitals taken for this visit. 67 y.o.female presents for  Past Medical History:  Diagnosis Date   Allergy    Arthritis    BACK    Bell's palsy    Cancer ([place])    UTERINE    Cataract    MILD   GERD (gastroesophageal reflux disease)    Hyperlipidemia    Hypertension    Iritis    HLA B27 +   Prediabetes    S/P ORIF (open reduction internal fixation) fracture 1994   Right tib/fib   Thyroiditis, subacute    Vitamin D deficiency      No Active Allergies  Current Outpatient Medications on File Prior to Visit  Medication Sig   Acetaminophen (TYLENOL PO) Take by mouth.   amoxicillin (AMOXIL) 500 MG tablet Take 1 tablet (500 mg total) by mouth 3 (three) times daily. 7days   aspirin EC 81 MG tablet Take 81 mg by mouth daily.   Aspirin Effervescent (ALKA-SELTZER PO) Take by mouth.   bisoprolol-hydrochlorothiazide (ZIAC) 10-6.25 MG tablet TAKE 1 TABLET BY MOUTH EVERY MORNING FOR BLOOD PRESSURE   CALCIUM PO Take 600 mg by mouth daily.   cetirizine (ZYRTEC) 10 MG tablet Take 10 mg by mouth daily.   Cholecalciferol (VITAMIN D PO) Take 5,000 Int'l Units by mouth daily. Tuesday, Thursday, Saturday   Cyanocobalamin (VITAMIN B-12) 5000 MCG SUBL Place 5,000 mcg under the tongue once a week.   dexamethasone (DECADRON) 1 MG tablet Take 3 tabs for 3 days, 2 tabs for 3 days 1 tab  for 5 days. Take with food.   enalapril (VASOTEC) 20 MG tablet TAKE 1 TABLET BY MOUTH EVERY DAY FOR BLOOD PRESSURE   hyoscyamine (LEVSIN SL) 0.125 MG SL tablet DISSOLVE 1 TABLET UNDER THE TONGUE EVERY 4 HOURS AS NEEDED FOR CRAMPS OR NAUSEA   Multiple Vitamin (MULTIVITAMIN) capsule Take 1 capsule by mouth daily.   ondansetron (ZOFRAN) 8 MG tablet Take 1/2 to 1 tablet 3 x / day as needed for Nausea or Diarrhea   Probiotic Product (PROBIOTIC BLEND) CAPS Takes 1 daily   (Ultimate Blend Probiotic 150 Billion)   simvastatin (ZOCOR) 20 MG tablet TAKE 1 TABLET BY MOUTH AT BEDTIME   No current facility-administered medications on file prior to visit.    ROS: all negative except above.   Physical Exam:  There were no vitals taken for this visit.  General Appearance: Well nourished, in no apparent distress. Eyes: PERRLA, EOMs, conjunctiva no swelling or erythema Sinuses: No Frontal/maxillary tenderness ENT/Mouth: Ext aud canals clear, TMs without erythema, bulging. No erythema, swelling, or exudate on post pharynx.  Tonsils not swollen or erythematous. Hearing normal.  Neck: Supple, thyroid normal.  Respiratory: Respiratory effort normal, BS equal bilaterally without rales, rhonchi, wheezing or stridor.  Cardio: RRR with no MRGs. Brisk peripheral pulses without edema.  Abdomen: Soft, + BS.  Non tender, no guarding, rebound, hernias, masses. Lymphatics: Non tender without lymphadenopathy.  Musculoskeletal: Full ROM, 5/5 strength,  normal gait.  Skin: Warm, dry without rashes, lesions, ecchymosis.  Neuro: Cranial nerves intact. Normal muscle tone, no cerebellar symptoms. Sensation intact.  Psych: Awake and oriented X 3, normal affect, Insight and Judgment appropriate.     Alycia Rossetti, NP 2:40 PM [facility]

## 2022-04-04 ENCOUNTER — Encounter: Payer: Self-pay | Admitting: Nurse Practitioner

## 2022-04-04 ENCOUNTER — Ambulatory Visit (INDEPENDENT_AMBULATORY_CARE_PROVIDER_SITE_OTHER): Payer: Medicare Other | Admitting: Nurse Practitioner

## 2022-04-04 VITALS — BP 116/62 | HR 66 | Temp 97.7°F | Ht 61.0 in | Wt 127.0 lb

## 2022-04-04 DIAGNOSIS — R0989 Other specified symptoms and signs involving the circulatory and respiratory systems: Secondary | ICD-10-CM

## 2022-04-04 DIAGNOSIS — K52831 Collagenous colitis: Secondary | ICD-10-CM

## 2022-04-04 DIAGNOSIS — K51919 Ulcerative colitis, unspecified with unspecified complications: Secondary | ICD-10-CM

## 2022-04-04 MED ORDER — PREDNISONE 20 MG PO TABS: ORAL_TABLET | ORAL | 0 refills | Status: AC

## 2022-04-04 NOTE — Patient Instructions (Signed)
Prednisone taper as directed, if no improvement notify the office and will have pt see Dr [name] for further evaluation  Check BP in AM and PM.  If BP is not greater than 130/80 hold BP medication  Ulcerative Colitis, Adult  Ulcerative colitis is long-term (chronic) inflammation of the large intestine (colon) and rectum. Sores (ulcers) may also form in these areas. Ulcerative colitis, along with a closely related condition called Crohn's disease, is often referred to as inflammatory bowel disease. What are the causes? This condition may be caused by increased activity of the immune system in the intestines. The immune system is the system that protects the body against harmful bacteria, viruses, fungi, and other things that can make you sick. The cause of the increased activity of the immune system is not known. What increases the risk? The following factors may make you more likely to develop this condition: Being under 67 years old. Having a family history of ulcerative colitis. What are the signs or symptoms? Symptoms vary depending on how severe the condition is. Common symptoms include: Rectal bleeding. Diarrhea, often with blood or pus in the stool. Other symptoms can include: Pain or cramping in the abdomen. Fever. Tiredness (fatigue). Nausea, loss of appetite, or weight loss. Rectal pain. A strong and sudden need to have a bowel movement (bowel urgency). Anemia. Yellowing of the skin (jaundice) from liver dysfunction or skin rashes. Symptoms can range from mild to severe. They may come and go. How is this diagnosed? This condition may be diagnosed based on: Your symptoms and medical history. A physical exam. Tests, including: Blood tests and stool tests. X-rays. CT scan. MRI. Colonoscopy. For this test, a flexible tube is inserted into your anus, and your colon is examined. Biopsy. In this test, a tissue sample is taken from your colon and examined under a  microscope. How is this treated? There is no cure for this condition, but it can be managed. Treatment depends on the severity of the disease. Treatment for this condition may include medicines to: Decrease swelling and inflammation. Control your immune system. Treat infections. Relieve pain. Control diarrhea. Severe flare-ups may need to be treated at a hospital. Treatment in a hospital may involve: Resting the bowel. This involves not eating or drinking for a period of time. Getting medicines through an IV. Getting fluids and nutrition through: An IV. A tube that is passed through the nose and into the stomach (nasogastric or NG tube). Surgery to remove the affected part of the colon. This may be done if other treatments are not helping. This condition increases the risk of colon cancer. Adults with this condition will need to be checked for colon cancer throughout life. Follow these instructions at home: Medicines and vitamins Take over-the-counter and prescription medicines only as told by your health care provider. Do not take aspirin. If you were prescribed an antibiotic medicine, take it as told by your health care provider. Do not stop taking the antibiotic even if you start to feel better. Ask your health care provider if you should take any vitamins or supplements. You may need to take: Calcium and vitamin D for bone health. Iron to help treat anemia. Lifestyle Exercise regularly. Work with your health care provider to manage your condition and educate yourself about your condition. Do not use any products that contain nicotine or tobacco. These products include cigarettes, chewing tobacco, and vaping devices, such as e-cigarettes. If you need help quitting, ask your health care provider. If you  drink alcohol: Limit how much you have to: 0-1 drink a day for women who are not pregnant. 0-2 drinks a day for men. Know how much alcohol is in a drink. In the [place] one drink equals  one 12 oz bottle of beer (355 mL), one 5 oz glass of wine (148 mL), or one 1 oz glass of hard liquor (44 mL). Eating and drinking Keep a food diary. This may help you identify and avoid any foods that trigger your symptoms. Drink enough fluid to keep your urine pale yellow. Follow a well-balanced diet as told by your health care provider. This may include: Avoiding carbonated drinks. Avoiding popcorn, vegetable skins, nuts, and other high-fiber foods. Avoiding high-fat foods. Eating smaller meals, but more often. Limiting sugary drinks. Limiting caffeine. Follow food safety recommendations as told by your health care provider. This may include making sure you: Avoid eating raw or undercooked meat, fish, or eggs. Do not eat or drink spoiled or expired foods and drinks. General instructions Wash your hands often with soap and water for at least 20 seconds. If soap and water are not available, use hand sanitizer. Stay up to date on your vaccinations, including a yearly (annual) flu shot. Ask your health care provider which vaccines you should get. Have cancer screening tests as told by your health care provider. Ulcerative colitis may place you at increased risk for colon cancer. Keep all follow-up visits. This is important. Where to find more information You can find some helpful and educational information about ulcerative colitis at the [facility] of Diabetes and Digestive and Kidney Diseases online here: [email or web address] Contact a health care provider if: Your symptoms do not improve or they get worse with treatment. You continue to lose weight. You have constant cramps or loose stools. You develop a new skin rash, skin sores, or eye problems. You have a fever or chills. Get help right away if: You have bloody diarrhea. You have severe bleeding from the rectum. You feel that your heart is racing. You have severe pain in your abdomen. Your abdomen swells (abdominal  distension). Your abdomen is tender to the touch. You vomit. Summary Ulcerative colitis is long-lasting (chronic) inflammation of the large intestine (colon) and rectum. Sores (ulcers) may also form in these areas. Follow instructions from your health care provider about medicines, lifestyle changes, and eating and drinking. Contact your health care provider if symptoms do not improve or they get worse with treatment. Get help right away if you have severe abdominal pain, abdominal swelling, or severe bleeding from the rectum. Keep all follow-up visits. This is important. This information is not intended to replace advice given to you by your health care provider. Make sure you discuss any questions you have with your health care provider. Document Revised: 09/09/2019 Document Reviewed: 09/09/2019 Elsevier Patient Education  [place].

## 2022-04-05 ENCOUNTER — Other Ambulatory Visit: Payer: Self-pay | Admitting: Nurse Practitioner

## 2022-04-05 DIAGNOSIS — E876 Hypokalemia: Secondary | ICD-10-CM

## 2022-04-05 LAB — CBC WITH DIFFERENTIAL/PLATELET
Absolute Monocytes: 578 cells/uL (ref 200–950)
Basophils Absolute: 48 cells/uL (ref 0–200)
Basophils Relative: 0.7 %
Eosinophils Absolute: 41 cells/uL (ref 15–500)
Eosinophils Relative: 0.6 %
HCT: 38.5 % (ref 35.0–45.0)
Hemoglobin: 13.1 g/dL (ref 11.7–15.5)
Lymphs Abs: 1537 cells/uL (ref 850–3900)
MCH: 30.3 pg (ref 27.0–33.0)
MCHC: 34 g/dL (ref 32.0–36.0)
MCV: 88.9 fL (ref 80.0–100.0)
MPV: 10.2 fL (ref 7.5–12.5)
Monocytes Relative: 8.5 %
Neutro Abs: 4597 cells/uL (ref 1500–7800)
Neutrophils Relative %: 67.6 %
Platelets: 299 10*3/uL (ref 140–400)
RBC: 4.33 10*6/uL (ref 3.80–5.10)
RDW: 12.6 % (ref 11.0–15.0)
Total Lymphocyte: 22.6 %
WBC: 6.8 10*3/uL (ref 3.8–10.8)

## 2022-04-05 LAB — COMPLETE METABOLIC PANEL WITH GFR
AG Ratio: 1.9 (calc) (ref 1.0–2.5)
ALT: 11 U/L (ref 6–29)
AST: 17 U/L (ref 10–35)
Albumin: 4 g/dL (ref 3.6–5.1)
Alkaline phosphatase (APISO): 74 U/L (ref 37–153)
BUN: 10 mg/dL (ref 7–25)
CO2: 28 mmol/L (ref 20–32)
Calcium: 9.4 mg/dL (ref 8.6–10.4)
Chloride: 105 mmol/L (ref 98–110)
Creat: 0.7 mg/dL (ref 0.50–1.05)
Globulin: 2.1 g/dL (calc) (ref 1.9–3.7)
Glucose, Bld: 118 mg/dL — ABNORMAL HIGH (ref 65–99)
Potassium: 3.1 mmol/L — ABNORMAL LOW (ref 3.5–5.3)
Sodium: 142 mmol/L (ref 135–146)
Total Bilirubin: 0.4 mg/dL (ref 0.2–1.2)
Total Protein: 6.1 g/dL (ref 6.1–8.1)
eGFR: 95 mL/min/{1.73_m2} (ref >=60)

## 2022-04-10 ENCOUNTER — Other Ambulatory Visit: Payer: Self-pay | Admitting: Internal Medicine

## 2022-04-10 ENCOUNTER — Encounter: Payer: Self-pay | Admitting: Nurse Practitioner

## 2022-04-10 DIAGNOSIS — E782 Mixed hyperlipidemia: Secondary | ICD-10-CM

## 2022-04-10 MED ORDER — SIMVASTATIN 20 MG PO TABS: ORAL_TABLET | ORAL | 3 refills | Status: DC

## 2022-04-18 ENCOUNTER — Ambulatory Visit (INDEPENDENT_AMBULATORY_CARE_PROVIDER_SITE_OTHER): Payer: Medicare Other

## 2022-04-18 DIAGNOSIS — E876 Hypokalemia: Secondary | ICD-10-CM

## 2022-04-18 NOTE — Progress Notes (Signed)
Patient presents to the office for a nurse visit to have labs done to recheck Potassium levels. No questions or concerns.

## 2022-04-19 LAB — BASIC METABOLIC PANEL WITH GFR
BUN: 16 mg/dL (ref 7–25)
CO2: 23 mmol/L (ref 20–32)
Calcium: 9.6 mg/dL (ref 8.6–10.4)
Chloride: 109 mmol/L (ref 98–110)
Creat: 0.68 mg/dL (ref 0.50–1.05)
Glucose, Bld: 95 mg/dL (ref 65–99)
Potassium: 4.4 mmol/L (ref 3.5–5.3)
Sodium: 143 mmol/L (ref 135–146)
eGFR: 96 mL/min/{1.73_m2} (ref >=60)

## 2022-05-22 ENCOUNTER — Other Ambulatory Visit: Payer: Self-pay

## 2022-05-22 MED ORDER — ENALAPRIL MALEATE 20 MG PO TABS: ORAL_TABLET | ORAL | 3 refills | Status: DC

## 2022-05-24 ENCOUNTER — Encounter: Payer: Self-pay | Admitting: Internal Medicine

## 2022-05-24 NOTE — Patient Instructions (Signed)

## 2022-05-24 NOTE — Progress Notes (Signed)
Vicki Hansen    ADULT    &     ADOLESCENT    INTERNAL     MEDICINE Vicki Hansen, D.NP                Vicki Maw, MD                 Vicki Hansen, D.NP                       [facility] [address]  -  [address] [place], [place].    [phone] Telephone   [phone] TeleFax   [phone]                            Annual  Comprehensive Evaluation   & Examination  Future Appointments  Date Time Provider Department  05/25/2022                    cpe 11:00 AM [name], MD [facility]  06/22/2022  2:50 PM Iva Boop, MD [facility]  08/25/2022                    wellness  9:00 AM Vicki Glimpse, NP [facility]  06/01/2023                   cpe 11:00 AM [name], MD [facility]          This very nice 67 y.o. MWF presents for a Screening /Preventative Visit & comprehensive evaluation and management of multiple medical co-morbidities.  Patient has been followed for HTN, HLD, T2_NIDDM  Prediabetes  and Vitamin D Deficiency. Patient is on Budesonide & Cholestyramine for Collagenous Colitis followed by Dr Christella Hartigan.          HTN predates since  2010. Patient's BP has been controlled at home and patient denies any cardiac symptoms as chest pain, palpitations, shortness of breath, dizziness or ankle swelling. Today's BP  is at goal - 122/72 .         Patient's hyperlipidemia is controlled with diet and medications. Patient denies myalgias or other medication SE's. Last lipids were at goal except elevated Trig's :  Lab Results  Component Value Date   CHOL 173 10/06/2020   HDL 45 (L) 10/06/2020   LDLCALC 97 10/06/2020   TRIG 224 (H) 10/06/2020   CHOLHDL 3.8 10/06/2020         Patient has hx/o prediabetes predating (A1c 5.8% /2014) and patient denies reactive hypoglycemic symptoms, visual blurring, diabetic polys or paresthesias. Last A1c was normal & at goal :  Lab Results  Component Value Date   HGBA1C 5.5 05/17/2020          Finally, patient has history of Vitamin D Deficiency and last Vitamin D was at goal :  Lab Results  Component Value Date   VD25OH 101 (H) 05/17/2020     Current Outpatient Medications on File Prior to Visit  Medication Sig   aspirin EC 81 MG tablet Take daily.   budesonide (ENTOCORT EC) 3 MG 24 hr capsule Take 2 capsules daily for 6 weeks, then decrease to 1 capsule daily.   CALCIUM 600 mg  Take  daily.   cetirizine (ZYRTEC) 10 MG tablet Take  daily.   VITAMIN  D  5,000 Units  Take b daily.   cholestyramine (QUESTRAN) 4 g packet MIX AND DRINK 1 PACKET, DAILY   VITAMIN B-12 5000 MCG SL Place under the tongue once a week.   enalapril 20 MG tablet TAKE 1 TABLET  EVERY DAY    Flaxseed Oi1  200 mg  Take  2  times daily.    hyoscyamine SL 0.125 MG SL  DISSOLVE 1 TABLET  EVERY 4 HOURS AS NEEDED   loperamide (IMODIUM) 2 MG tablet Take  4   times daily as needed for diarrhea    Multiple Vitamin  Take 1 capsule  daily.   Probiotic  Take daily.   simvastatin  20 MG tablet TAKE 1 TABLET AT BEDTIME    No Known Allergies   Past Medical History:  Diagnosis Date   Allergy    Arthritis    BACK    Cancer (HCC)    UTERINE    Cataract    MILD   GERD (gastroesophageal reflux disease)    Hyperlipidemia    Hypertension    Iritis    HLA B27 +   Prediabetes    S/P ORIF (open reduction internal fixation) fracture 1994   Right tib/fib   Thyroiditis, subacute    Vitamin D deficiency      Health Maintenance  Topic Date Due   COVID-19 Vaccine (2 - Janssen risk series) 05/21/2019   TETANUS/TDAP  10/06/2021 (Originally 07/17/2017)   DEXA SCAN  10/07/2023 (Originally 09/24/2020)   Zoster Vaccines- Shingrix (1 of 2) 10/07/2023 (Originally 09/[birth year]/1974)   INFLUENZA VACCINE  08/16/2021   MAMMOGRAM  02/26/2023   Pneumonia Vaccine 3+ Years old  Completed   Hepatitis C Screening  Completed   HIV Screening  Completed   HPV VACCINES  Aged Out   PAP SMEAR-Modifier  Discontinued     Immunization History  Administered Date(s) Administered   Influenza Inj Mdck Quad  12/03/2017, 11/13/2018   Influenza, High Dose  11/05/2020   Janssen (J&J) SARS-COV-2 Vacc 04/23/2019   PNEUMOCOCCAL -20 10/06/2020   PPD Test 09/30/2013, 05/13/2019, 05/17/2020   Pneumococcal-23 08/27/2008   Tdap 07/18/2007   Zoster, Live 12/29/2015    Last Colon  & EGD - Sept 2022 - Dr Christella Hartigan   Last MGM - 02/28/2021  Past Surgical History:  Procedure Laterality Date   CESAREAN SECTION     COLONOSCOPY  03/17/2005   HEMS    KNEE ARTHROSCOPY Left 1972   right leg repair  1994   rod    ROBOTIC ASSISTED TOTAL HYSTERECTOMY  2015   pt. states she had robot assisted hysterectomy and uterus was removed abdominally due to scar tissue.  she also states that both tubes and ovaries were removed.   STAPEDECTOMY Left 2003   TONSILLECTOMY AND ADENOIDECTOMY       Family History  Problem Relation Age of Onset   Cancer Mother        uterine, kidney   Hypertension Mother    Colon polyps Mother    Stomach cancer Mother    Diabetes Father    Hypertension Father    Stroke Father    Cancer Father        kidney   Uterine cancer Sister    Colon cancer Maternal Grandmother    Colon cancer Maternal Grandfather    Heart disease Paternal Grandmother    Breast cancer Paternal Aunt    Cancer Maternal Aunt        stomach, colon, bladder  Cancer Maternal Uncle        stomach, colon, bladder   Esophageal cancer Neg Hx    Rectal cancer Neg Hx      Social History   Tobacco Use   Smoking status: Never   Smokeless tobacco: Never  Vaping Use   Vaping Use: Never used  Substance Use Topics   Alcohol use: No   Drug use: No      ROS Constitutional: Denies fever, chills, weight loss/gain, headaches, insomnia,  night sweats, and change in appetite. Does c/o fatigue. Eyes: Denies redness, blurred vision, diplopia, discharge, itchy, watery eyes.  ENT: Denies discharge, congestion, post nasal drip,  epistaxis, sore throat, earache, hearing loss, dental pain, Tinnitus, Vertigo, Sinus pain, snoring.  Cardio: Denies chest pain, palpitations, irregular heartbeat, syncope, dyspnea, diaphoresis, orthopnea, PND, claudication, edema Respiratory: denies cough, dyspnea, DOE, pleurisy, hoarseness, laryngitis, wheezing.  Gastrointestinal: Denies dysphagia, heartburn, reflux, water brash, pain, cramps, nausea, vomiting, bloating, diarrhea, constipation, hematemesis, melena, hematochezia, jaundice, hemorrhoids Genitourinary: Denies dysuria, frequency, urgency, nocturia, hesitancy, discharge, hematuria, flank pain Breast: Breast lumps, nipple discharge, bleeding.  Musculoskeletal: Denies arthralgia, myalgia, stiffness, Jt. Swelling, pain, limp, and strain/sprain. Denies falls. Skin: Denies puritis, rash, hives, warts, acne, eczema, changing in skin lesion Neuro: No weakness, tremor, incoordination, spasms, paresthesia, pain Psychiatric: Denies confusion, memory loss, sensory loss. Denies Depression. Endocrine: Denies change in weight, skin, hair change, nocturia, and paresthesia, diabetic polys, visual blurring, hyper / hypo glycemic episodes.  Heme/Lymph: No excessive bleeding, bruising, enlarged lymph nodes.  Physical Exam  BP 122/72   Pulse 75   Temp (!) 97.5 F (36.4 C)   Resp 16   Ht 5\' 1"  (1.549 m)   Wt 124 lb (56.2 kg)   SpO2 95%   BMI 23.43 kg/m   General Appearance: Well nourished, well groomed and in no apparent distress.  Eyes: PERRLA, EOMs, Normal.. Normal fundi and vessels.  Rt conjunctiva injected & upper lid swollen & erythematous. Sinuses: No frontal/maxillary tenderness ENT/Mouth: EACs patent / TMs  nl. Nares clear without erythema, swelling, mucoid exudates. Oral hygiene is good. No erythema, swelling, or exudate. Tongue normal, non-obstructing. Tonsils not swollen or erythematous. Hearing normal.  Neck: Supple, thyroid not palpable. No bruits, nodes or JVD. Respiratory:  Respiratory effort normal.  BS equal and clear bilateral without rales, rhonci, wheezing or stridor. Cardio: Heart sounds are normal with regular rate and rhythm and no murmurs, rubs or gallops. Peripheral pulses are normal and equal bilaterally without edema. No aortic or femoral bruits. Chest: symmetric with normal excursions and percussion. Breasts: Symmetric, without lumps, nipple discharge, retractions, or fibrocystic changes.  Abdomen: Flat, soft with bowel sounds active. Nontender, no guarding, rebound, hernias, masses, or organomegaly.  Lymphatics: Non tender without lymphadenopathy.  Genitourinary:  Musculoskeletal: Full ROM all peripheral extremities, joint stability, 5/5 strength, and normal gait. Skin: Warm and dry without rashes, lesions, cyanosis, clubbing or  ecchymosis.  Neuro: Cranial nerves intact, reflexes equal bilaterally. Normal muscle tone, no cerebellar symptoms. Sensation intact.  Pysch: Alert and oriented X 3, normal affect, Insight and Judgment appropriate.    Assessment and Plan  1. Essential hypertension  - EKG 12-Lead - Urinalysis, Routine w reflex microscopic - Microalbumin / creatinine urine ratio - CBC with Differential/Platelet - COMPLETE METABOLIC PANEL WITH GFR - Magnesium - TSH  2. Hyperlipidemia, mixed  - EKG 12-Lead - Lipid panel - TSH  3. Abnormal glucose  - EKG 12-Lead - Hemoglobin A1c - Insulin, random  4. Vitamin D deficiency  -  VITAMIN D 25 Hydroxy   5. Collagenous colitis   6. Screening for heart disease  - EKG 12-Lead  7. FH: heart disease  - EKG 12-Lead  8. Fatigue   9. Medication management - Urinalysis, Routine w reflex microscopic - Microalbumin / creatinine urine ratio - CBC with Differential/Platelet - COMPLETE METABOLIC PANEL WITH GFR - Magnesium - Lipid panel - TSH - Hemoglobin A1c - Insulin, random - VITAMIN D 25 Hydroxy           Patient was counseled in prudent diet to achieve/maintain BMI  less than 25 for weight control, BP monitoring, regular exercise and medications. Discussed med's effects and SE's. Screening labs and tests as requested with regular follow-up as recommended. Over 40 minutes of exam, counseling, chart review and high complex critical decision making was performed.   Vicki Maw, MD.

## 2022-05-25 ENCOUNTER — Encounter: Payer: Self-pay | Admitting: Internal Medicine

## 2022-05-25 ENCOUNTER — Ambulatory Visit (INDEPENDENT_AMBULATORY_CARE_PROVIDER_SITE_OTHER): Payer: Medicare Other | Admitting: Internal Medicine

## 2022-05-25 VITALS — BP 122/72 | HR 75 | Temp 97.5°F | Resp 16 | Ht 61.0 in | Wt 124.0 lb

## 2022-05-25 DIAGNOSIS — R197 Diarrhea, unspecified: Secondary | ICD-10-CM

## 2022-05-25 DIAGNOSIS — E876 Hypokalemia: Secondary | ICD-10-CM

## 2022-05-25 DIAGNOSIS — R5383 Other fatigue: Secondary | ICD-10-CM

## 2022-05-25 DIAGNOSIS — I10 Essential (primary) hypertension: Secondary | ICD-10-CM | POA: Diagnosis not present

## 2022-05-25 DIAGNOSIS — Z8249 Family history of ischemic heart disease and other diseases of the circulatory system: Secondary | ICD-10-CM

## 2022-05-25 DIAGNOSIS — K52831 Collagenous colitis: Secondary | ICD-10-CM

## 2022-05-25 DIAGNOSIS — E559 Vitamin D deficiency, unspecified: Secondary | ICD-10-CM | POA: Diagnosis not present

## 2022-05-25 DIAGNOSIS — E782 Mixed hyperlipidemia: Secondary | ICD-10-CM

## 2022-05-25 DIAGNOSIS — Z79899 Other long term (current) drug therapy: Secondary | ICD-10-CM

## 2022-05-25 DIAGNOSIS — R109 Unspecified abdominal pain: Secondary | ICD-10-CM

## 2022-05-25 DIAGNOSIS — R7309 Other abnormal glucose: Secondary | ICD-10-CM | POA: Diagnosis not present

## 2022-05-25 DIAGNOSIS — Z136 Encounter for screening for cardiovascular disorders: Secondary | ICD-10-CM | POA: Diagnosis not present

## 2022-05-25 LAB — CBC WITH DIFFERENTIAL/PLATELET
Basophils Absolute: 29 cells/uL (ref 0–200)
Lymphs Abs: 1616 cells/uL (ref 850–3900)
MCHC: 32.8 g/dL (ref 32.0–36.0)
Neutro Abs: 1339 cells/uL — ABNORMAL LOW (ref 1500–7800)
Neutrophils Relative %: 37.2 %
RBC: 4.05 10*6/uL (ref 3.80–5.10)
RDW: 12.5 % (ref 11.0–15.0)
WBC: 3.6 10*3/uL — ABNORMAL LOW (ref 3.8–10.8)

## 2022-05-25 MED ORDER — ONDANSETRON HCL 8 MG PO TABS
ORAL_TABLET | ORAL | 3 refills | Status: DC
Start: 2022-05-25 — End: 2023-06-25

## 2022-05-25 MED ORDER — HYOSCYAMINE SULFATE 0.125 MG SL SUBL
SUBLINGUAL_TABLET | SUBLINGUAL | 3 refills | Status: AC
Start: 2022-05-25 — End: ?

## 2022-05-26 LAB — CBC WITH DIFFERENTIAL/PLATELET
Absolute Monocytes: 587 cells/uL (ref 200–950)
Basophils Relative: 0.8 %
Eosinophils Absolute: 29 cells/uL (ref 15–500)
Eosinophils Relative: 0.8 %
HCT: 37.8 % (ref 35.0–45.0)
Hemoglobin: 12.4 g/dL (ref 11.7–15.5)
MCH: 30.6 pg (ref 27.0–33.0)
MCV: 93.3 fL (ref 80.0–100.0)
MPV: 9.9 fL (ref 7.5–12.5)
Monocytes Relative: 16.3 %
Platelets: 298 10*3/uL (ref 140–400)
Total Lymphocyte: 44.9 %

## 2022-05-26 LAB — LIPID PANEL
Cholesterol: 136 mg/dL (ref <200)
HDL: 35 mg/dL — ABNORMAL LOW (ref >=50)
LDL Cholesterol (Calc): 71 mg/dL (calc)
Non-HDL Cholesterol (Calc): 101 mg/dL (calc) (ref <130)
Total CHOL/HDL Ratio: 3.9 (calc) (ref <5.0)
Triglycerides: 199 mg/dL — ABNORMAL HIGH (ref <150)

## 2022-05-26 LAB — COMPLETE METABOLIC PANEL WITH GFR
AG Ratio: 2 (calc) (ref 1.0–2.5)
ALT: 12 U/L (ref 6–29)
AST: 15 U/L (ref 10–35)
Albumin: 4.3 g/dL (ref 3.6–5.1)
Alkaline phosphatase (APISO): 75 U/L (ref 37–153)
BUN: 16 mg/dL (ref 7–25)
CO2: 24 mmol/L (ref 20–32)
Calcium: 9.3 mg/dL (ref 8.6–10.4)
Chloride: 107 mmol/L (ref 98–110)
Creat: 0.64 mg/dL (ref 0.50–1.05)
Globulin: 2.1 g/dL (calc) (ref 1.9–3.7)
Glucose, Bld: 88 mg/dL (ref 65–99)
Potassium: 3.7 mmol/L (ref 3.5–5.3)
Sodium: 140 mmol/L (ref 135–146)
Total Bilirubin: 0.3 mg/dL (ref 0.2–1.2)
Total Protein: 6.4 g/dL (ref 6.1–8.1)
eGFR: 97 mL/min/{1.73_m2} (ref >=60)

## 2022-05-26 LAB — MICROALBUMIN / CREATININE URINE RATIO
Creatinine, Urine: 289 mg/dL — ABNORMAL HIGH (ref 20–275)
Microalb Creat Ratio: 5 mg/g creat (ref <30)
Microalb, Ur: 1.4 mg/dL

## 2022-05-26 LAB — URINALYSIS, ROUTINE W REFLEX MICROSCOPIC
Bacteria, UA: NONE SEEN /HPF
Bilirubin Urine: NEGATIVE
Glucose, UA: NEGATIVE
Hgb urine dipstick: NEGATIVE
Hyaline Cast: NONE SEEN /LPF
Leukocytes,Ua: NEGATIVE
Nitrite: NEGATIVE
Specific Gravity, Urine: 1.041 — ABNORMAL HIGH (ref 1.001–1.035)
pH: 6 (ref 5.0–8.0)

## 2022-05-26 LAB — HEMOGLOBIN A1C
Hgb A1c MFr Bld: 5.8 % of total Hgb — ABNORMAL HIGH (ref <5.7)
Mean Plasma Glucose: 120 mg/dL
eAG (mmol/L): 6.6 mmol/L

## 2022-05-26 LAB — VITAMIN D 25 HYDROXY (VIT D DEFICIENCY, FRACTURES): Vit D, 25-Hydroxy: 89 ng/mL (ref 30–100)

## 2022-05-26 LAB — MAGNESIUM: Magnesium: 2.1 mg/dL (ref 1.5–2.5)

## 2022-05-26 LAB — INSULIN, RANDOM: Insulin: 10 u[IU]/mL

## 2022-05-26 LAB — TSH: TSH: 0.9 mIU/L (ref 0.40–4.50)

## 2022-05-28 ENCOUNTER — Encounter: Payer: Self-pay | Admitting: Internal Medicine

## 2022-05-28 NOTE — Progress Notes (Signed)
^<^<^<^<^<^<^<^<^<^<^<^<^<^<^<^<^<^<^<^<^<^<^<^<^<^<^<^<^<^<^<^<^<^<^<^<^ ^>^>^>^>^>^>^>^>^>^>^>>^>^>^>^>^>^>^>^>^>^>^>^>^>^>^>^>^>^>^>^>^>^>^>^>^>  -  Test results slightly outside the reference range are not unusual. If there is anything important, I will review this with you,  otherwise it is considered normal test values.  If you have further questions,  please do not hesitate to contact me at the office or via My Chart.   ^<^<^<^<^<^<^<^<^<^<^<^<^<^<^<^<^<^<^<^<^<^<^<^<^<^<^<^<^<^<^<^<^<^<^<^<^ ^>^>^>^>^>^>^>^>^>^>^>^>^>^>^>^>^>^>^>^>^>^>^>^>^>^>^>^>^>^>^>^>^>^>^>^>^  -  Chol = 136  &  LDL = 71   - both  Excellent   - Very low risk for Heart Attack  / Stroke ^>^>^>^>^>^>^>^>^>^>^>^>^>^>^>^>^>^>^>^>^>^>^>^>^>^>^>^>^>^>^>^>^>^>^>^>^  -  But  Triglycerides (  = 199 ) or fats in blood are too high                 (   Ideal or  Goal is less than 150  !  )    - Recommend avoid fried & greasy foods,  sweets / candy,   - Avoid white rice  (brown or wild rice or Quinoa is OK),   - Avoid white potatoes  (sweet potatoes are OK)   - Avoid anything made from white flour  - bagels, doughnuts, rolls, buns, biscuits, white and   wheat breads, pizza crust and traditional  pasta made of white flour & egg white  - (vegetarian pasta or spinach or wheat pasta is OK).    - Multi-grain bread is OK - like multi-grain flat bread or  sandwich thins.   - Avoid alcohol in excess.   - Exercise is also important. ^>^>^>^>^>^>^>^>^>^>^>^>^>^>^>^>^>^>^>^>^>^>^>^>^>^>^>^>^>^>^>^>^>^>^>^>^  A1c = 6.0% - Blood sugar and A1c are STILL elevated in the borderline and                                                             early or pre-diabetes range which has the same   300% increased risk for heart attack, stroke, cancer and                                               alzheimer- type vascular dementia as full blown diabetes.   But the good news is that diet, exercise with                                                          weight loss can cure the early diabetes at this point. ^>^>^>^>^>^>^>^>^>^>^>^>^>^>^>^>^>^>^>^>^>^>^>^>^>^>^>^>^>^>^>^>^>^>^>^>^  -  Vitamin D = 89 - Excellent    !            Please keep dose same  ^>^>^>^>^>^>^>^>^>^>^>^>^>^>^>^>^>^>^>^>^>^>^>^>^>^>^>^>^>^>^>^>^>^>^>^>^ ^>^>^>^>^>^>^>^>^>^>^>^>^>^>^>^>^>^>^>^>^>^>^>^>^>^>^>^>^>^>^>^>^>^>^>^>^  -  All Else - CBC - Kidneys - Electrolytes - Liver - Magnesium & Thyroid    - all  Normal / OK  ^>^>^>^>^>^>^>^>^>^>^>^>^>^>^>^>^>^>^>^>^>^>^>^>^>^>^>^>^>^>^>^>^>^>^>^>^ ^>^>^>^>^>^>^>^>^>^>^>^>^>^>^>^>^>^>^>^>^>^>^>^>^>^>^>^>^>^>^>^>^>^>^>^>^

## 2022-06-22 ENCOUNTER — Encounter: Payer: Self-pay | Admitting: Internal Medicine

## 2022-06-22 ENCOUNTER — Ambulatory Visit (INDEPENDENT_AMBULATORY_CARE_PROVIDER_SITE_OTHER): Payer: Medicare Other | Admitting: Internal Medicine

## 2022-06-22 ENCOUNTER — Ambulatory Visit: Payer: Medicare Other | Admitting: Internal Medicine

## 2022-06-22 VITALS — BP 130/70 | HR 64 | Ht 60.5 in | Wt 123.2 lb

## 2022-06-22 DIAGNOSIS — K642 Third degree hemorrhoids: Secondary | ICD-10-CM

## 2022-06-22 DIAGNOSIS — K52831 Collagenous colitis: Secondary | ICD-10-CM

## 2022-06-22 MED ORDER — DIPHENOXYLATE-ATROPINE 2.5-0.025 MG PO TABS: 1.0000 | ORAL_TABLET | Freq: Four times a day (QID) | ORAL | 1 refills | Status: AC | PRN

## 2022-06-22 MED ORDER — BUDESONIDE 3 MG PO CPEP: 9.0000 mg | ORAL_CAPSULE | ORAL | 2 refills | Status: DC

## 2022-06-22 NOTE — Progress Notes (Signed)
Tiaa Kiah Call 67 y.o. Jan 15, 1956 [id number]  Assessment & Plan:   Encounter Diagnoses  Name Primary?   Collagenous colitis Yes   Prolapsed internal hemorrhoids, grade 3    Meds ordered this encounter  Medications   budesonide (ENTOCORT EC) 3 MG 24 hr capsule    Sig: Take 3 capsules (9 mg total) by mouth every morning.    Dispense:  90 capsule    Refill:  2   diphenoxylate-atropine (LOMOTIL) 2.5-0.025 MG tablet    Sig: Take 1 tablet by mouth 4 (four) times daily as needed for diarrhea or loose stools.    Dispense:  90 tablet    Refill:  1   Handout on microscopic and collagenous colitis provided Low fiber diet recommended.  With her diarrhea and prior response to fiber she is not a fiber tolerant patient.  The types of fiber she should have would be soluble fiber.  We will work on this going forward.  It does sound like to me she will need chronic therapy with budesonide given her clinical course.  I do not see any medications to removed from her list that could be a trigger.  It was interesting that she was only able to receive "cow pie" like stools on budesonide and cholestyramine before.  That could mean she may need additional therapy other than the budesonide though she did not tolerate cholestyramine very well and it did not provide formed stools so neither she nor I are keen on trying that again.  The patient will be seen again on 08/31/2022 and she is to message me in the interim if she is not improving adequately.   CC: [name], MD  Subjective:  Review of pertinent gastrointestinal problems: 1.  Routine risk for colon cancer.  Colonoscopy April 2018 showed no polyps.  Internal hemorrhoids were noted.  She was recommended to have repeat colonoscopy at 10-year interval for colon cancer screening.  Colonoscopy 2008 was normal. 2.  Chronic diarrhea led to colonoscopy November 2020.  Terminal ileum was normal.  There was mild erythema and granularity throughout the  colon.  Biopsies were taken.  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  Budesonide was too expensive.  Imodium caused cramping in nausea but was quite effective even at low doses.  Tried to prescribe mesalamine January 2021 however ended up with sulfasalazine 1gram TID due to her insurance restrictions.  Prednisone trial with good partial response.  Again we tried prescribing Entocort 3 mg pills 3 pills once daily.  Very good response as of November 2021.  Switched to lower dose Entocort at 1 pill once daily and started full-strength balsalazide 3 pills 3 times daily and 1 Imodium pill once daily.  Colonoscopy 09/2020 showed normal terminal ileum which was biopsied, right colon was normal-appearing and was biopsied, left colon was very mildly erythematous and was biopsied.  All colon biopsies showed "collagenous colitis".  I recommended she continue the budesonide 9 mg daily and also start cholestyramine 4 g twice daily, also stop balsalazide completely.  Duodenal biopsies at EGD in September 2022 negative for celiac disease.  Celiac serologies have also been negative and IgA normal  3.  Right-sided abdominal pains June 2022 led to testing including CT scan abdomen pelvis with IV and oral contrast which showed gallstones but otherwise was s essentially normal.,    09/2021 [facility] NP visit came off budesonide and cholestyramine   Chief Complaint: Diarrhea and hemorrhoids  HPI 67 year old white woman formerly a patient of Dr. Christella Hartigan with diagnosis of collagenous colitis treated with budesonide and cholestyramine.  She was last seen in this office in September 2023 and at that time the decision was made to discontinue her budesonide and cholestyramine.  She did okay for a few months and then started having problems with diarrhea again in Jan and had seen Dr. Salomon Fick of primary care couple of times.  For a variety of reasons he was  recommending fiber but she said it bloats her and makes her diarrhea worse.  She is now having numerous soft and normal watery stools.  Her hemorrhoids will prolapse at times and either spontaneously or manually need to be reduced.  Some slight bright red blood blood on the toilet paper with wiping.  She has been nauseous and has had some dull upper abdominal pain. She has a used some loperamide and it helps a little bit but as 1 would expect the benefits are short-lived.   Wt Readings from Last 3 Encounters:  06/22/22 123 lb 4 oz (55.9 kg)  05/25/22 124 lb (56.2 kg)  04/18/22 125 lb 12.8 oz (57.1 kg)   She chose me as her physician in Dr. Larae Grooms absence because I cared for her mother, Luther Parody who had gastric cancer.   No Known Allergies Current Meds  Medication Sig   Acetaminophen (TYLENOL PO) Take by mouth.   aspirin EC 81 MG tablet Take 81 mg by mouth daily.   bisoprolol-hydrochlorothiazide (ZIAC) 10-6.25 MG tablet TAKE 1 TABLET BY MOUTH EVERY MORNING FOR BLOOD PRESSURE (Patient taking differently: as needed. TAKE 1 TABLET BY MOUTH EVERY MORNING FOR BLOOD PRESSURE)   budesonide (ENTOCORT EC) 3 MG 24 hr capsule Take 3 capsules (9 mg total) by mouth every morning.   CALCIUM PO Take 600 mg by mouth daily.   cetirizine (ZYRTEC) 10 MG tablet Take 10 mg by mouth daily.   Cholecalciferol (VITAMIN D PO) Take 5,000 Int'l Units by mouth daily. Tuesday, Thursday, Saturday   Cyanocobalamin (VITAMIN B-12) 5000 MCG SUBL Place 5,000 mcg under the tongue once a week.   diphenoxylate-atropine (LOMOTIL) 2.5-0.025 MG tablet Take 1 tablet by mouth 4 (four) times daily as needed for diarrhea or loose stools.   enalapril (VASOTEC) 20 MG tablet TAKE 1 TABLET BY MOUTH EVERY DAY FOR BLOOD PRESSURE (Patient taking differently: as needed. TAKE 1 TABLET BY MOUTH EVERY DAY FOR BLOOD PRESSURE)   hyoscyamine (LEVSIN SL) 0.125 MG SL tablet DISSOLVE 1 TABLET UNDER THE TONGUE EVERY 4 HOURS AS NEEDED FOR CRAMPS OR  NAUSEA   Loperamide HCl (IMODIUM PO) Take by mouth.   Multiple Vitamin (MULTIVITAMIN) capsule Take 1 capsule by mouth daily.   ondansetron (ZOFRAN) 8 MG tablet Take 1/2 to 1 tablet 3 x / day as needed for Nausea or Diarrhea   Probiotic Product (PROBIOTIC BLEND) CAPS Takes 1 daily   (Ultimate Blend Probiotic 150 Billion)   simvastatin (ZOCOR) 20 MG tablet Take  1 tablet  at Bedtime  for Cholesterol   Past Medical History:  Diagnosis Date   Allergy    Arthritis    BACK    Bell's palsy    Cancer (HCC)    UTERINE    Cataract    MILD   GERD (gastroesophageal reflux disease)    Hyperlipidemia    Hypertension    Iritis    HLA B27 +   Prediabetes    S/P ORIF (open reduction internal fixation) fracture 1994  Right tib/fib   Thyroiditis, subacute    Vitamin D deficiency    Past Surgical History:  Procedure Laterality Date   CESAREAN SECTION     COLONOSCOPY  03/17/2005   HEMS    KNEE ARTHROSCOPY Left 1972   right leg repair  1994   rod    ROBOTIC ASSISTED TOTAL HYSTERECTOMY  2015   pt. states she had robot assisted hysterectomy and uterus was removed abdominally due to scar tissue.  she also states that both tubes and ovaries were removed.   STAPEDECTOMY Left 2003   TONSILLECTOMY AND ADENOIDECTOMY     Social History   Social History Narrative   Married with 1 child   Never tobacco no drug use no alcohol   family history includes Breast cancer in her paternal aunt; Cancer in her father, maternal aunt, maternal uncle, and mother; Colon cancer in her maternal grandfather and maternal grandmother; Colon polyps in her mother; Diabetes in her father; Heart disease in her paternal grandmother; Hypertension in her father and mother; Stomach cancer in her mother; Stroke in her father; Uterine cancer in her sister.   Review of Systems As per HPI  Objective:   Physical Exam @BP  130/70 (BP Location: Left Arm, Patient Position: Sitting, Cuff Size: Normal)   Pulse 64   Ht 5' 0.5"  (1.537 m)   Wt 123 lb 4 oz (55.9 kg)   BMI 23.67 kg/m @  General:  NAD Eyes:   anicteric Lungs:  clear Heart::  S1S2 no rubs, murmurs or gallops Abdomen:  soft and nontender, BS+ Ext:   no edema, cyanosis or clubbing  Rectal:   Data Reviewed:

## 2022-06-22 NOTE — Patient Instructions (Signed)
Follow a low fiber diet.   We have sent the following medications to your pharmacy for you to pick up at your convenience: Budesonide and Lomotil  Message [place] if not improving.  We have provided you with information on microscopic Colitis and low fiber eating to read.  _______________________________________________________  If your blood pressure at your visit was 140/90 or greater, please contact your primary care physician to follow up on this.  _______________________________________________________  If you are age 65 or older, your body mass index should be between 23-30. Your Body mass index is 23.67 kg/m. If this is out of the aforementioned range listed, please consider follow up with your Primary Care Provider.  If you are age 35 or younger, your body mass index should be between 19-25. Your Body mass index is 23.67 kg/m. If this is out of the aformentioned range listed, please consider follow up with your Primary Care Provider.   ________________________________________________________  The [facility] providers would like to encourage you to use [facility] to communicate with providers for non-urgent requests or questions.  Due to long hold times on the telephone, sending your provider a message by [facility] may be a faster and more efficient way to get a response.  Please allow 48 business hours for a response.  Please remember that this is for non-urgent requests.  _______________________________________________________   I appreciate the opportunity to care for you. Stan Head, MD, [facility]

## 2022-06-26 ENCOUNTER — Encounter: Payer: Self-pay | Admitting: Internal Medicine

## 2022-06-26 ENCOUNTER — Telehealth: Payer: Self-pay | Admitting: Pharmacy Technician

## 2022-06-26 ENCOUNTER — Other Ambulatory Visit (HOSPITAL_COMMUNITY): Payer: Self-pay

## 2022-06-26 NOTE — Telephone Encounter (Signed)
Patient Advocate Encounter  Received notification from [facility] that prior authorization for BUDESONIDE 3MG  is required.   PA submitted on 6.10.24 Key [id number]  Status is pending

## 2022-06-28 ENCOUNTER — Other Ambulatory Visit (HOSPITAL_COMMUNITY): Payer: Self-pay

## 2022-06-28 NOTE — Telephone Encounter (Signed)
Patient Advocate Encounter  Prior Authorization for BUDESONIDE 3MG  has been approved with [facility].    PA# [id number] Effective dates: 6.10.24 through UNTIL FURTHER NOTICE (12.31.2099)  Per [facility] test claim, copay for 30 days supply is $45.85

## 2022-07-03 ENCOUNTER — Other Ambulatory Visit (HOSPITAL_COMMUNITY): Payer: Self-pay

## 2022-08-25 ENCOUNTER — Encounter: Payer: Self-pay | Admitting: Nurse Practitioner

## 2022-08-25 ENCOUNTER — Ambulatory Visit (INDEPENDENT_AMBULATORY_CARE_PROVIDER_SITE_OTHER): Payer: Medicare Other | Admitting: Nurse Practitioner

## 2022-08-25 VITALS — BP 120/76 | HR 59 | Temp 97.8°F | Ht 60.5 in | Wt 131.2 lb

## 2022-08-25 DIAGNOSIS — E559 Vitamin D deficiency, unspecified: Secondary | ICD-10-CM | POA: Diagnosis not present

## 2022-08-25 DIAGNOSIS — K5190 Ulcerative colitis, unspecified, without complications: Secondary | ICD-10-CM

## 2022-08-25 DIAGNOSIS — R6889 Other general symptoms and signs: Secondary | ICD-10-CM | POA: Diagnosis not present

## 2022-08-25 DIAGNOSIS — Z8589 Personal history of malignant neoplasm of other organs and systems: Secondary | ICD-10-CM

## 2022-08-25 DIAGNOSIS — Z79899 Other long term (current) drug therapy: Secondary | ICD-10-CM

## 2022-08-25 DIAGNOSIS — I10 Essential (primary) hypertension: Secondary | ICD-10-CM

## 2022-08-25 DIAGNOSIS — R7309 Other abnormal glucose: Secondary | ICD-10-CM

## 2022-08-25 DIAGNOSIS — E782 Mixed hyperlipidemia: Secondary | ICD-10-CM | POA: Diagnosis not present

## 2022-08-25 DIAGNOSIS — Z0001 Encounter for general adult medical examination with abnormal findings: Secondary | ICD-10-CM | POA: Diagnosis not present

## 2022-08-25 DIAGNOSIS — Z0000 Encounter for general adult medical examination without abnormal findings: Secondary | ICD-10-CM

## 2022-08-25 DIAGNOSIS — Z8249 Family history of ischemic heart disease and other diseases of the circulatory system: Secondary | ICD-10-CM

## 2022-08-25 LAB — CBC WITH DIFFERENTIAL/PLATELET
Absolute Monocytes: 660 cells/uL (ref 200–950)
Basophils Absolute: 60 cells/uL (ref 0–200)
Basophils Relative: 0.5 %
Eosinophils Absolute: 96 cells/uL (ref 15–500)
Eosinophils Relative: 0.8 %
HCT: 42.8 % (ref 35.0–45.0)
Hemoglobin: 14.1 g/dL (ref 11.7–15.5)
Lymphs Abs: 2436 cells/uL (ref 850–3900)
MCH: 30.6 pg (ref 27.0–33.0)
MCHC: 32.9 g/dL (ref 32.0–36.0)
MCV: 92.8 fL (ref 80.0–100.0)
MPV: 9.9 fL (ref 7.5–12.5)
Monocytes Relative: 5.5 %
Neutro Abs: 8748 cells/uL — ABNORMAL HIGH (ref 1500–7800)
Neutrophils Relative %: 72.9 %
Platelets: 244 10*3/uL (ref 140–400)
RBC: 4.61 10*6/uL (ref 3.80–5.10)
RDW: 11.7 % (ref 11.0–15.0)
Total Lymphocyte: 20.3 %
WBC: 12 10*3/uL — ABNORMAL HIGH (ref 3.8–10.8)

## 2022-08-25 NOTE — Patient Instructions (Signed)
 Ulcerative Colitis, Adult  Ulcerative colitis is long-term (chronic) inflammation of the large intestine (colon) and rectum. Sores (ulcers) may also form in these areas. Ulcerative colitis, along with a closely related condition called Crohn's disease, is often referred to as inflammatory bowel disease. What are the causes? This condition may be caused by increased activity of the immune system in the intestines. The immune system is the system that protects the body against harmful bacteria, viruses, fungi, and other things that can make you sick. The cause of the increased activity of the immune system is not known. What increases the risk? The following factors may make you more likely to develop this condition: Being under 73 years old. Having a family history of ulcerative colitis. What are the signs or symptoms? Symptoms vary depending on how severe the condition is. Common symptoms include: Rectal bleeding. Diarrhea, often with blood or pus in the stool. Other symptoms can include: Pain or cramping in the abdomen. Fever. Tiredness (fatigue). Nausea, loss of appetite, or weight loss. Rectal pain. A strong and sudden need to have a bowel movement (bowel urgency). Anemia. Yellowing of the skin (jaundice) from liver dysfunction or skin rashes. Symptoms can range from mild to severe. They may come and go. How is this diagnosed? This condition may be diagnosed based on: Your symptoms and medical history. A physical exam. Tests, including: Blood tests and stool tests. X-rays. CT scan. MRI. Colonoscopy. For this test, a flexible tube is inserted into your anus, and your colon is examined. Biopsy. In this test, a tissue sample is taken from your colon and examined under a microscope. How is this treated? There is no cure for this condition, but it can be managed. Treatment depends on the severity of the disease. Treatment for this condition may include medicines to: Decrease  swelling and inflammation. Control your immune system. Treat infections. Relieve pain. Control diarrhea. Severe flare-ups may need to be treated at a hospital. Treatment in a hospital may involve: Resting the bowel. This involves not eating or drinking for a period of time. Getting medicines through an IV. Getting fluids and nutrition through: An IV. A tube that is passed through the nose and into the stomach (nasogastric or NG tube). Surgery to remove the affected part of the colon. This may be done if other treatments are not helping. This condition increases the risk of colon cancer. Adults with this condition will need to be checked for colon cancer throughout life. Follow these instructions at home: Medicines and vitamins Take over-the-counter and prescription medicines only as told by your health care provider. Do not take aspirin. If you were prescribed an antibiotic medicine, take it as told by your health care provider. Do not stop taking the antibiotic even if you start to feel better. Ask your health care provider if you should take any vitamins or supplements. You may need to take: Calcium and vitamin D for bone health. Iron to help treat anemia. Lifestyle Exercise regularly. Work with your health care provider to manage your condition and educate yourself about your condition. Do not use any products that contain nicotine or tobacco. These products include cigarettes, chewing tobacco, and vaping devices, such as e-cigarettes. If you need help quitting, ask your health care provider. If you drink alcohol: Limit how much you have to: 0-1 drink a day for women who are not pregnant. 0-2 drinks a day for men. Know how much alcohol is in a drink. In the [place] one drink equals one  12 oz bottle of beer (355 mL), one 5 oz glass of wine (148 mL), or one 1 oz glass of hard liquor (44 mL). Eating and drinking Keep a food diary. This may help you identify and avoid any foods that  trigger your symptoms. Drink enough fluid to keep your urine pale yellow. Follow a well-balanced diet as told by your health care provider. This may include: Avoiding carbonated drinks. Avoiding popcorn, vegetable skins, nuts, and other high-fiber foods. Avoiding high-fat foods. Eating smaller meals, but more often. Limiting sugary drinks. Limiting caffeine. Follow food safety recommendations as told by your health care provider. This may include making sure you: Avoid eating raw or undercooked meat, fish, or eggs. Do not eat or drink spoiled or expired foods and drinks. General instructions Wash your hands often with soap and water for at least 20 seconds. If soap and water are not available, use hand sanitizer. Stay up to date on your vaccinations, including a yearly (annual) flu shot. Ask your health care provider which vaccines you should get. Have cancer screening tests as told by your health care provider. Ulcerative colitis may place you at increased risk for colon cancer. Keep all follow-up visits. This is important. Where to find more information You can find some helpful and educational information about ulcerative colitis at the [facility] online here: [email or web address] Contact a health care provider if: Your symptoms do not improve or they get worse with treatment. You continue to lose weight. You have constant cramps or loose stools. You develop a new skin rash, skin sores, or eye problems. You have a fever or chills. Get help right away if: You have bloody diarrhea. You have severe bleeding from the rectum. You feel that your heart is racing. You have severe pain in your abdomen. Your abdomen swells (abdominal distension). Your abdomen is tender to the touch. You vomit. Summary Ulcerative colitis is long-lasting (chronic) inflammation of the large intestine (colon) and rectum. Sores (ulcers) may also form in these  areas. Follow instructions from your health care provider about medicines, lifestyle changes, and eating and drinking. Contact your health care provider if symptoms do not improve or they get worse with treatment. Get help right away if you have severe abdominal pain, abdominal swelling, or severe bleeding from the rectum. Keep all follow-up visits. This is important. This information is not intended to replace advice given to you by your health care provider. Make sure you discuss any questions you have with your health care provider. Document Revised: 07/30/2019 Document Reviewed: 09/09/2019 Elsevier Patient Education  2024 [employer].

## 2022-08-25 NOTE — Progress Notes (Signed)
MEDICARE ANNUAL WELLNESS VISIT AND FOLLOW UP  Assessment:   Vicki Hansen was seen today for medicare wellness.  Diagnoses and all orders for this visit:  Welcome to Medicare preventive visit Due annually  Health maintenance reviewed  Primary hypertension Controlled Discussed DASH (Dietary Approaches to Stop Hypertension) DASH diet is lower in sodium than a typical American diet. Cut back on foods that are high in saturated fat, cholesterol, and trans fats. Eat more whole-grain foods, fish, poultry, and nuts Remain active and exercise as tolerated daily.  Monitor BP at home-Call if greater than 130/80.   Hyperlipidemia, mixed Continues to be elevated  Discussed lifestyle modifications. Recommended diet heavy in fruits and veggies, omega 3's. Decrease consumption of animal meats, cheeses, and dairy products. Remain active and exercise as tolerated. Continue to monitor. Check lipids/TSH  Other abnormal glucose (hx of prediabetes) Education: Reviewed 'ABCs' of diabetes management  Discussed goals to be met and/or maintained include A1C (<7) Blood pressure (<130/80) Cholesterol (LDL <70) Continue Eye Exam yearly  Continue Dental Exam Q6 mo Discussed dietary recommendations Discussed Physical Activity recommendations   Vitamin D deficiency Continue vitamin D supplement; check annually and PRN  Medication management All medications discussed and reviewed in full. All questions and concerns regarding medications addressed.    Ulcerative colitis without complications, unspecified location [facility]) Dr. Christella Hartigan is managing Colonoscopy UTD Follows Q6 mo  Family hx of heart disease Keep BP well controlled Continue to monitor  History of squamous cell carcinoma (forehead) Continue to follow with [facility] Follow up for total body check on 09/2022  Orders Placed This Encounter  Procedures   CBC with Differential/Platelet   COMPLETE METABOLIC PANEL WITH GFR   Lipid  panel   Hemoglobin A1c   Notify office for further evaluation and treatment, questions or concerns if any reported s/s fail to improve.   The patient was advised to call back or seek an in-person evaluation if any symptoms worsen or if the condition fails to improve as anticipated.   Further disposition pending results of labs. Discussed med's effects and SE's.    I discussed the assessment and treatment plan with the patient. The patient was provided an opportunity to ask questions and all were answered. The patient agreed with the plan and demonstrated an understanding of the instructions.  Discussed med's effects and SE's. Screening labs and tests as requested with regular follow-up as recommended.  I provided 40 minutes of face-to-face time during this encounter including counseling, chart review, and critical decision making was preformed.   Future Appointments  Date Time Provider Department Center  08/31/2022  1:50 PM Iva Boop, MD [facility]  12/05/2022 10:30 AM [name], MD [facility] None  03/08/2023 10:30 AM Adela Glimpse, NP [facility] None  06/06/2023 11:00 AM [name], MD [facility] None  08/27/2023  9:00 AM Adela Glimpse, NP [facility] None     Plan:   During the course of the visit the patient was educated and counseled about appropriate screening and preventive services including:   Pneumococcal vaccine  Prevnar 13 Influenza vaccine Td vaccine Screening electrocardiogram Bone densitometry screening Colorectal cancer screening Diabetes screening Glaucoma screening Nutrition counseling  Advanced directives: requested   Subjective:  Vicki Hansen is a 68 y.o. female who presents for Medicare Annual Wellness Visit and 3 month follow up. She has Hypertension; Hyperlipidemia, mixed; Abnormal glucose; Vitamin D deficiency; Medication management; Diarrhea; Ulcerative colitis (HCC); and Collagenous colitis on their problem  list.  Overall she reports doing well.  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  Reported doing well with no complications.  Healing well.  Sutures were removed and steri-strips applied.  She will continue to follow as scheduled on 09/2022 for full body skin check.  Dr. Christella Hartigan diagnosed with likely mild ulcerative colitis following workup for chronic diarrhea in 11/2018.  She completed an EGD and colonoscopy 09/29/2020.Marland Kitchen She continues to take Entocort at 1 pill once daily and balsalazide 3 pills 3 times daily and 1 Imodium pill once daily. Cost barrier with budesonide but is now taking and feels improvement.  She has decreased her fiber intake per Dr. Christella Hartigan recommendation - this has also improved her symptoms.  BMI is Body mass index is 25.2 kg/m., she has been working on diet and exercise. Wt Readings from Last 3 Encounters:  08/25/22 131 lb 3.2 oz (59.5 kg)  06/22/22 123 lb 4 oz (55.9 kg)  05/25/22 124 lb (56.2 kg)   Her blood pressure has been controlled at home, with reporting average around 125/80, today their BP is BP: 120/76 She does workout. She denies chest pain, shortness of breath, dizziness.   She is on cholesterol medication  (simvastatin 20 mg daily) and denies myalgias. Her cholesterol is at goal. The cholesterol last visit was:   Lab Results  Component Value Date   CHOL 136 05/25/2022   HDL 35 (L) 05/25/2022   LDLCALC 71 05/25/2022   TRIG 199 (H) 05/25/2022   CHOLHDL 3.9 05/25/2022    She has been working on diet and exercise for glucose management, and denies increased appetite, nausea, paresthesia of the feet, polydipsia, and polyuria.  Last A1C in the office was:  Lab Results  Component Value Date   HGBA1C 5.8 (H) 05/25/2022   Last GFR: Lab Results  Component Value Date   GFRNONAA 90 05/17/2020   Patient is on Vitamin D supplement.   Lab Results  Component Value Date   VD25OH 89  05/25/2022      Medication Review: Current Outpatient Medications on File Prior to Visit  Medication Sig Dispense Refill   Acetaminophen (TYLENOL PO) Take by mouth.     aspirin EC 81 MG tablet Take 81 mg by mouth daily.     bisoprolol-hydrochlorothiazide (ZIAC) 10-6.25 MG tablet TAKE 1 TABLET BY MOUTH EVERY MORNING FOR BLOOD PRESSURE 90 tablet 1   budesonide (ENTOCORT EC) 3 MG 24 hr capsule Take 3 capsules (9 mg total) by mouth every morning. 90 capsule 2   CALCIUM PO Take 600 mg by mouth daily.     cetirizine (ZYRTEC) 10 MG tablet Take 10 mg by mouth daily.     Cholecalciferol (VITAMIN D PO) Take 5,000 Int'l Units by mouth daily. Tuesday, Thursday, Saturday     Cyanocobalamin (VITAMIN B-12) 5000 MCG SUBL Place 5,000 mcg under the tongue once a week.     diphenoxylate-atropine (LOMOTIL) 2.5-0.025 MG tablet Take 1 tablet by mouth 4 (four) times daily as needed for diarrhea or loose stools. 90 tablet 1   enalapril (VASOTEC) 20 MG tablet TAKE 1 TABLET BY MOUTH EVERY DAY FOR BLOOD PRESSURE 90 tablet 3   hyoscyamine (LEVSIN SL) 0.125 MG SL tablet DISSOLVE 1 TABLET UNDER THE TONGUE EVERY 4 HOURS AS NEEDED FOR CRAMPS OR NAUSEA 360 tablet 3   Multiple Vitamin (MULTIVITAMIN) capsule Take 1 capsule by mouth daily.     ondansetron (ZOFRAN) 8 MG tablet Take 1/2 to 1 tablet 3 x /  day as needed for Nausea or Diarrhea 90 tablet 3   Probiotic Product (PROBIOTIC BLEND) CAPS Takes 1 daily   (Ultimate Blend Probiotic 150 Billion)     simvastatin (ZOCOR) 20 MG tablet Take  1 tablet  at Bedtime  for Cholesterol 90 tablet 3   Loperamide HCl (IMODIUM PO) Take by mouth. (Patient not taking: Reported on 08/25/2022)     No current facility-administered medications on file prior to visit.    No Known Allergies  Current Problems (verified) Patient Active Problem List   Diagnosis Date Noted   Collagenous colitis 05/20/2021   Ulcerative colitis (HCC) 10/05/2020   Diarrhea 08/19/2018   Medication management  04/24/2014   Hypertension    Hyperlipidemia, mixed    Abnormal glucose    Vitamin D deficiency     Screening Tests Immunization History  Administered Date(s) Administered   Influenza Inj Mdck Quad With Preservative 12/03/2017, 11/13/2018   Influenza, High Dose Seasonal PF 11/05/2020, 11/10/2021   Janssen (J&J) SARS-COV-2 Vaccination 04/23/2019   PNEUMOCOCCAL CONJUGATE-20 10/06/2020   PPD Test 09/30/2013, 05/13/2019, 05/17/2020   Pfizer Covid-19 Vaccine Bivalent Booster 65yrs & up 11/20/2019, 11/05/2020   Pneumococcal-Unspecified 08/27/2008   Tdap 07/18/2007   Zoster, Live 12/29/2015    Preventative care: Last colonoscopy: 09/29/2020, 10 year recall  Last EGD: 09/29/2020  Last mammogram:  03/2022 Due annually Last pap smear/pelvic exam: s/p TAH, done    DEXA: reports had normal in 2019 at Dr. Vincente Poli, will plan in 5-10 years  Prior vaccinations: TD or Tdap: 2009, will get with next cut  Influenza: Due 10/2021 Pneumo 20: UTD  Shingles/Zostavax: 2017, hold off shingrix, cost  Covid 19: repots J&J x 2, send dates, encouraged new booster  Names of Other Physician/Practitioners you currently use: 1. [facility] here for primary care 2. Dr. Alben Spittle, eye doctor, last visit 2023, glasses, mild cataracts  3. overdue, dentist, last visit remote, encouraged Q6 mo  Patient Care Team: [name], MD as PCP - General (Internal Medicine) Louis Meckel, MD (Inactive) as Consulting Physician (Gastroenterology)  SURGICAL HISTORY She  has a past surgical history that includes Cesarean section; Knee arthroscopy (Left, 1972); Tonsillectomy and adenoidectomy; Stapedectomy (Left, 2003); right leg repair (1994); Colonoscopy (03/17/2005); and Robotic assisted total hysterectomy (2015). FAMILY HISTORY Her family history includes Breast cancer in her paternal aunt; Cancer in her father, maternal aunt, maternal uncle, and mother; Colon cancer in her  maternal grandfather and maternal grandmother; Colon polyps in her mother; Diabetes in her father; Heart disease in her paternal grandmother; Hypertension in her father and mother; Stomach cancer in her mother; Stroke in her father; Uterine cancer in her sister. SOCIAL HISTORY She  reports that she has never smoked. She has never used smokeless tobacco. She reports that she does not drink alcohol and does not use drugs.   MEDICARE WELLNESS OBJECTIVES: Physical activity: Current Exercise Habits: Home exercise routine Cardiac risk factors:   Depression/mood screen:      08/25/2022    9:15 AM  Depression screen PHQ 2/9  Decreased Interest 0  Down, Depressed, Hopeless 0  PHQ - 2 Score 0    ADLs:     08/25/2022    9:15 AM 05/28/2022   10:34 PM  In your present state of health, do you have any difficulty performing the following activities:  Hearing? 0 0  Vision? 0 0  Difficulty concentrating or making decisions? 0 0  Walking or climbing stairs? 0 0  Dressing  or bathing? 0 0  Doing errands, shopping? 0 0     Cognitive Testing  Alert? Yes  Normal Appearance?Yes  Oriented to person? Yes  Place? Yes   Time? Yes  Recall of three objects?  Yes  Can perform simple calculations? Yes  Displays appropriate judgment?Yes  Can read the correct time from a watch face?Yes  EOL planning: Does Patient Have a Medical Advance Directive?: Yes Type of Advance Directive: Living will   Review of Systems  Constitutional:  Negative for malaise/fatigue and weight loss.  HENT:  Negative for hearing loss and tinnitus.   Eyes:  Negative for blurred vision and double vision.  Respiratory:  Negative for cough, shortness of breath and wheezing.   Cardiovascular:  Negative for chest pain, palpitations, orthopnea, claudication and leg swelling.  Gastrointestinal:  Positive for diarrhea (chronic, GI working up, colitis). Negative for abdominal pain, blood in stool, constipation, heartburn, melena, nausea and  vomiting.  Genitourinary: Negative.   Musculoskeletal:  Negative for joint pain and myalgias.  Skin:  Negative for rash.  Neurological:  Negative for dizziness, tingling, sensory change, weakness and headaches.  Endo/Heme/Allergies:  Negative for polydipsia.  Psychiatric/Behavioral: Negative.    All other systems reviewed and are negative.    Objective:     Today's Vitals   08/25/22 0853  BP: 120/76  Pulse: (!) 59  Temp: 97.8 F (36.6 C)  SpO2: 99%  Weight: 131 lb 3.2 oz (59.5 kg)  Height: 5' 0.5" (1.537 m)   Body mass index is 25.2 kg/m.  General appearance: alert, no distress, WD/WN, female HEENT: normocephalic, sclerae anicteric, TMs pearly, nares patent, no discharge or erythema, pharynx normal Oral cavity: MMM, no lesions Neck: supple, no lymphadenopathy, no thyromegaly, no masses Heart: RRR, normal S1, S2, no murmurs Lungs: CTA bilaterally, no wheezes, rhonchi, or rales Abdomen: +bs, soft, non tender, non distended, no masses, no hepatomegaly, no splenomegaly Musculoskeletal: nontender, no swelling, no obvious deformity Extremities: no edema, no cyanosis, no clubbing Pulses: 2+ symmetric, upper and lower extremities, normal cap refill Neurological: alert, oriented x 3, CN2-12 intact, strength normal upper extremities and lower extremities, sensation normal throughout, DTRs 2+ throughout, no cerebellar signs, gait normal Psychiatric: normal affect, behavior normal, pleasant   Medicare Attestation I have personally reviewed: The patient's medical and social history Their use of alcohol, tobacco or illicit drugs Their current medications and supplements The patient's functional ability including ADLs,fall risks, home safety risks, cognitive, and hearing and visual impairment Diet and physical activities Evidence for depression or mood disorders  The patient's weight, height, BMI, and visual acuity have been recorded in the chart.  I have made referrals, counseling,  and provided education to the patient based on review of the above and I have provided the patient with a written personalized care plan for preventive services.     Adela Glimpse, NP   08/25/2022

## 2022-08-28 ENCOUNTER — Encounter: Payer: Self-pay | Admitting: Nurse Practitioner

## 2022-08-31 ENCOUNTER — Ambulatory Visit (INDEPENDENT_AMBULATORY_CARE_PROVIDER_SITE_OTHER): Payer: Medicare Other | Admitting: Internal Medicine

## 2022-08-31 ENCOUNTER — Encounter: Payer: Self-pay | Admitting: Internal Medicine

## 2022-08-31 ENCOUNTER — Other Ambulatory Visit: Payer: Self-pay | Admitting: Nurse Practitioner

## 2022-08-31 VITALS — BP 124/80 | HR 61 | Ht 61.0 in | Wt 128.0 lb

## 2022-08-31 DIAGNOSIS — K52831 Collagenous colitis: Secondary | ICD-10-CM

## 2022-08-31 NOTE — Patient Instructions (Signed)
Glad you are doing better. Try and adjust your Budesonide so you are taking the lowest dose to control your symptoms. From 9mg  daily try to do 6mg  daily for several weeks and then try 3mg  daily.   Let us know if you have any questions or concerns.   _______________________________________________________  If your blood pressure at your visit was 140/90 or greater, please contact your primary care physician to follow up on this.  _______________________________________________________  If you are age 64 or older, your body mass index should be between 23-30. Your Body mass index is 24.19 kg/m. If this is out of the aforementioned range listed, please consider follow up with your Primary Care Provider.  If you are age 23 or younger, your body mass index should be between 19-25. Your Body mass index is 24.19 kg/m. If this is out of the aformentioned range listed, please consider follow up with your Primary Care Provider.   ________________________________________________________  The [facility] providers would like to encourage you to use [facility] to communicate with providers for non-urgent requests or questions.  Due to long hold times on the telephone, sending your provider a message by [facility] may be a faster and more efficient way to get a response.  Please allow 48 business hours for a response.  Please remember that this is for non-urgent requests.  _______________________________________________________   I appreciate the opportunity to care for you. Stan Head, MD, [facility]

## 2022-08-31 NOTE — Progress Notes (Signed)
Vicki Hansen 67 y.o. 07/11/1955 [id number]  Assessment & Plan:   Encounter Diagnosis  Name Primary?   Collagenous colitis Yes    Find lowest effective dose of budesonide so she will reduce to 6 mg daily and if that works go to 3 mg daily but given her overall history she should not stop budesonide.  Potential side effects reviewed again I think the risk-benefit ratio favors continued therapy.  She will return routinely in a year and we will communicate through MyChart to coordinate dosing going forward for the time being.  CC: [name], MD   Subjective:   Review of pertinent gastrointestinal problems: 1.  Routine risk for colon cancer.  Colonoscopy April 2018 showed no polyps.  Internal hemorrhoids were noted.  She was recommended to have repeat colonoscopy at 10-year interval for colon cancer screening.  Colonoscopy 2008 was normal. 2.  Chronic diarrhea led to colonoscopy November 2020.  Terminal ileum was normal.  There was mild erythema and granularity throughout the colon.  Biopsies were taken.  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  Budesonide was too expensive.  Imodium caused cramping in nausea but was quite effective even at low doses.  Tried to prescribe mesalamine January 2021 however ended up with sulfasalazine 1gram TID due to her insurance restrictions.  Prednisone trial with good partial response.  Again we tried prescribing Entocort 3 mg pills 3 pills once daily.  Very good response as of November 2021.  Switched to lower dose Entocort at 1 pill once daily and started full-strength balsalazide 3 pills 3 times daily and 1 Imodium pill once daily.  Colonoscopy 09/2020 showed normal terminal ileum which was biopsied, right colon was normal-appearing and was biopsied, left colon was very mildly erythematous and was biopsied.  All colon biopsies showed "collagenous colitis".  I recommended she  continue the budesonide 9 mg daily and also start cholestyramine 4 g twice daily, also stop balsalazide completely.  Duodenal biopsies at EGD in September 2022 negative for celiac disease.  Celiac serologies have also been negative and IgA normal   3.  Right-sided abdominal pains June 2022 led to testing including CT scan abdomen pelvis with IV and oral contrast which showed gallstones but otherwise was s essentially normal.,      09/2021 PG NP visit came off budesonide and cholestyramine   06/22/22 CEG - recurrent diarrhea - rx budesonide 9 mg every day prn Lomotil  Chief Complaint: Follow-up of collagenous colitis  HPI Patient is a 67 year old white woman with availed GI problems who returns reporting she is overall doing well back on budesonide 9 mg daily.  She occasionally gets constipated and does not move her bowels for 3 days but it does not bother her.  She is pleased with her situation overall. Wt Readings from Last 3 Encounters:  08/31/22 128 lb (58.1 kg)  08/25/22 131 lb 3.2 oz (59.5 kg)  06/22/22 123 lb 4 oz (55.9 kg)     No Known Allergies Current Meds  Medication Sig   Acetaminophen (TYLENOL PO) Take by mouth.   aspirin EC 81 MG tablet Take 81 mg by mouth daily.   bisoprolol-hydrochlorothiazide (ZIAC) 10-6.25 MG tablet TAKE 1 TABLET BY MOUTH EVERY MORNING FOR BLOOD PRESSURE   budesonide (ENTOCORT EC) 3 MG 24 hr capsule Take 3 capsules (9 mg total) by mouth every morning.   CALCIUM PO Take 600 mg by mouth daily.   cetirizine (ZYRTEC) 10  MG tablet Take 10 mg by mouth daily.   Cholecalciferol (VITAMIN D PO) Take 5,000 Int'l Units by mouth daily. Tuesday, Thursday, Saturday   Cyanocobalamin (VITAMIN B-12) 5000 MCG SUBL Place 5,000 mcg under the tongue once a week.   diphenoxylate-atropine (LOMOTIL) 2.5-0.025 MG tablet Take 1 tablet by mouth 4 (four) times daily as needed for diarrhea or loose stools.   enalapril (VASOTEC) 20 MG tablet TAKE 1 TABLET BY MOUTH EVERY DAY FOR BLOOD  PRESSURE   hyoscyamine (LEVSIN SL) 0.125 MG SL tablet DISSOLVE 1 TABLET UNDER THE TONGUE EVERY 4 HOURS AS NEEDED FOR CRAMPS OR NAUSEA   Multiple Vitamin (MULTIVITAMIN) capsule Take 1 capsule by mouth daily.   ondansetron (ZOFRAN) 8 MG tablet Take 1/2 to 1 tablet 3 x / day as needed for Nausea or Diarrhea   Probiotic Product (PROBIOTIC BLEND) CAPS Takes 1 daily   (Ultimate Blend Probiotic 150 Billion)   simvastatin (ZOCOR) 20 MG tablet Take  1 tablet  at Bedtime  for Cholesterol   Past Medical History:  Diagnosis Date   Allergy    Arthritis    BACK    Bell's palsy    Cancer (HCC)    UTERINE    Cataract    MILD   Collagenous colitis    GERD (gastroesophageal reflux disease)    Hyperlipidemia    Hypertension    Iritis    HLA B27 +   Prediabetes    S/P ORIF (open reduction internal fixation) fracture 1994   Right tib/fib   Thyroiditis, subacute    Vitamin D deficiency    Past Surgical History:  Procedure Laterality Date   CESAREAN SECTION     COLONOSCOPY  03/17/2005   HEMS    KNEE ARTHROSCOPY Left 1972   right leg repair  1994   rod    ROBOTIC ASSISTED TOTAL HYSTERECTOMY  2015   pt. states she had robot assisted hysterectomy and uterus was removed abdominally due to scar tissue.  she also states that both tubes and ovaries were removed.   STAPEDECTOMY Left 2003   TONSILLECTOMY AND ADENOIDECTOMY     Social History   Social History Narrative   Married with 1 child   Never tobacco no drug use no alcohol   family history includes Breast cancer in her paternal aunt; Cancer in her father, maternal aunt, maternal uncle, and mother; Colon cancer in her maternal grandfather and maternal grandmother; Colon polyps in her mother; Diabetes in her father; Heart disease in her paternal grandmother; Hypertension in her father and mother; Stomach cancer in her mother; Stroke in her father; Uterine cancer in her sister.   Review of Systems  As above Objective:   Physical Exam BP  124/80   Pulse 61   Ht 5\' 1"  (1.549 m)   Wt 128 lb (58.1 kg)   BMI 24.19 kg/m

## 2022-09-21 ENCOUNTER — Other Ambulatory Visit: Payer: Self-pay | Admitting: Internal Medicine

## 2022-12-04 ENCOUNTER — Encounter: Payer: Self-pay | Admitting: Internal Medicine

## 2022-12-04 NOTE — Patient Instructions (Signed)

## 2022-12-04 NOTE — Progress Notes (Unsigned)
[facility]  [name], M.D.        Rance Muir, D.NP      Adela Glimpse, D.NP  [facility] [address]  [place], [place]. [address] Telephone [phone] Telefax [phone]   Future Appointments  Date Time Provider Department  12/05/2022                    6 mo ov 10:30 AM [name], MD [facility]  03/08/2023                     wellness 10:30 AM Adela Glimpse, NP [facility]  06/06/2023                      cpe 11:00 AM [name], MD [facility]  08/27/2023                      3 mo ov   9:00 AM Adela Glimpse, NP [facility]    History of Present Illness:       This very nice 67 y.o.  MWF presents for  6  month follow up with HTN, HLD, Pre-Diabetes and Vitamin D Deficiency. Patient follows closely with Dr Myrtie Neither for her Collagenous  Colitis.        Patient is treated for HTN (2010) & BP has been controlled at home. Today's BP is at goal -  130/70 . Patient has had no complaints of any cardiac type chest pain, palpitations, dyspnea Vicki Hansen /PND, dizziness, claudication or dependent edema.        Hyperlipidemia is controlled with diet & Simvastatin. Patient denies myalgias or other med SE's. Last Lipids were not  at goal :  Lab Results  Component Value Date   CHOL 219 (H) 08/25/2022   HDL 51 08/25/2022   LDLCALC 120 (H) 08/25/2022   TRIG 335 (H) 08/25/2022   CHOLHDL 4.3 08/25/2022     Also, the patient has history of PreDiabetes (A1c 5.8% /2014) and has had no symptoms of reactive hypoglycemia, diabetic polys, paresthesias or visual blurring.  Last A1c was neargoal:  Lab Results  Component Value Date   HGBA1C 5.8 (H) 08/25/2022         Further, the patient also has history of Vitamin D Deficiency and supplements vitamin D without any suspected side-effects. Last vitamin D was at goal:  Lab Results  Component Value Date   VD25OH 89 05/25/2022       Current Outpatient  Medications  Medication Instructions   Acetaminophen ) As needed    aspirin EC   81 mg Daily   bisoprolol-hctzC) 10-6.25 MG tablet TAKE 1 TABLET EVERY MORNING   budesonide (ENTOCORT EC) 3 MG  Take 3 capsules (9 MG) every morning   CALCIUM  600 mg Daily   cetirizine 10 mg Daily   VITAMIN D 5,000  Units 3 x/week - Tuesday, Thursday, Saturday   LOMOTIL 2.5-0.025 MG tablet 1 tablet, Oral, 4 times daily PRN   enalapril 20 MG tablet TAKE 1 TABLET BY  EVERY DAY    hyoscyamine LEVSIN SL 0.125 MG  1 Tablet  SL every  4 Hrs as needed   Multiple Vitamin  1 capsule Daily   ondansetron  8 MG tablet Take 1/2-1 tablet 3 x /day as needed    Ultimate Blend Probiotic 150 Billion Takes 1 daily      simvastatin 20 MG tablet Take  1  tablet  at Bedtime     Vitamin B-12 5,000 mcg, Sl, Weekly    No Known Allergies    PMHx:   Past Medical History:  Diagnosis Date   Allergy    Arthritis    BACK    Cancer (HCC)    UTERINE    Cataract    MILD   GERD (gastroesophageal reflux disease)    Hyperlipidemia    Hypertension    Iritis    HLA B27 +   Prediabetes    S/P ORIF (open reduction internal fixation) fracture 1994   Right tib/fib   Thyroiditis, subacute    Vitamin D deficiency      Immunization History  Administered Date(s) Administered   Influenza Inj Mdck Quad With Preservative 12/03/2017, 11/13/2018   Janssen (J&J) SARS-COV-2 Vaccination 04/23/2019   PPD Test 09/30/2013, 05/13/2019   Pneumococcal-Unspecified 08/27/2008   Tdap 07/18/2007   Zoster 12/29/2015    Past Surgical History:  Procedure Laterality Date   CESAREAN SECTION     COLONOSCOPY  03/17/2005   HEMS    KNEE ARTHROSCOPY Left 1972   right leg repair  1994   rod    ROBOTIC ASSISTED TOTAL HYSTERECTOMY  2015   pt. states she had robot assisted hysterectomy and uterus was removed abdominally due to scar tissue.  she also states that both tubes and ovaries were removed.   STAPEDECTOMY Left 2003   TONSILLECTOMY AND  ADENOIDECTOMY      FHx:    Reviewed / unchanged   SHx:    Reviewed / unchanged    Systems Review:  Constitutional: Denies fever, chills, wt changes, headaches, insomnia, fatigue, night sweats, change in appetite. Eyes: Denies redness, blurred vision, diplopia, discharge, itchy, watery eyes.  ENT: Denies discharge, congestion, post nasal drip, epistaxis, sore throat, earache, hearing loss, dental pain, tinnitus, vertigo, sinus pain, snoring.  CV: Denies chest pain, palpitations, irregular heartbeat, syncope, dyspnea, diaphoresis, orthopnea, PND, claudication or edema. Respiratory: denies cough, dyspnea, DOE, pleurisy, hoarseness, laryngitis, wheezing.  Gastrointestinal: Denies dysphagia, odynophagia, heartburn, reflux, water brash, abdominal pain or cramps, nausea, vomiting, bloating, diarrhea, constipation, hematemesis, melena, hematochezia  or hemorrhoids. Genitourinary: Denies dysuria, frequency, urgency, nocturia, hesitancy, discharge, hematuria or flank pain. Musculoskeletal: Denies arthralgias, myalgias, stiffness, jt. swelling, pain, limping or strain/sprain.  Skin: Denies pruritus, rash, hives, warts, acne, eczema or change in skin lesion(s). Neuro: No weakness, tremor, incoordination, spasms, paresthesia or pain. Psychiatric: Denies confusion, memory loss or sensory loss. Endo: Denies change in weight, skin or hair change.  Heme/Lymph: No excessive bleeding, bruising or enlarged lymph nodes.  Physical Exam  BP 130/70   Pulse 66   Temp 97.9 F (36.6 C)   Resp 16   Ht 5\' 1"  (1.549 m)   Wt 136 lb (61.7 kg)   SpO2 99%   BMI 25.70 kg/m   Appears  well nourished, well groomed  and in no distress.  Eyes: PERRLA, EOMs, conjunctiva no swelling or erythema. Sinuses: No frontal/maxillary tenderness ENT/Mouth: EAC's clear, TM's nl w/o erythema, bulging. Nares clear w/o erythema, swelling, exudates. Oropharynx clear without erythema or exudates. Oral hygiene is good. Tongue  normal, non obstructing. Hearing intact.  Neck: Supple. Thyroid not palpable. Car 2+/2+ without bruits, nodes or JVD. Chest: Respirations nl with BS clear & equal w/o rales, rhonchi, wheezing or stridor.  Cor: Heart sounds normal w/ regular rate and rhythm without sig. murmurs, gallops, clicks or rubs. Peripheral pulses normal and equal  without edema.  Abdomen: Soft & bowel  sounds normal. Non-tender w/o guarding, rebound, hernias, masses or organomegaly.  Lymphatics: Unremarkable.  Musculoskeletal: Full ROM all peripheral extremities, joint stability, 5/5 strength and normal gait.  Skin: Warm, dry without exposed rashes, lesions or ecchymosis apparent.  Neuro: Cranial nerves intact, reflexes equal bilaterally. Sensory-motor testing grossly intact. Tendon reflexes grossly intact.  Pysch: Alert & oriented x 3.  Insight and judgement nl & appropriate. No ideations.  Assessment and Plan:  1. Labile hypertension  - Continue medication, monitor blood pressure at home.  - Continue DASH diet.  Reminder to go to the ER if any CP,  SOB, nausea, dizziness, severe HA, changes vision/speech.    - CBC with Differential/Platelet - COMPLETE METABOLIC PANEL WITH GFR - Magnesium - TSH  2. Hyperlipidemia, mixed  - Continue diet/meds, exercise,& lifestyle modifications.  - Continue monitor periodic cholesterol/liver & renal functions     - Lipid panel - TSH   3. Abnormal glucose  Continue diet, exercise  - Lifestyle modifications.  - Monitor appropriate labs     - Hemoglobin A1c - Insulin, random   4. Vitamin D deficiency  - Continue supplementation    - VITAMIN D 25 Hydroxy    5. Collagenous colitis  - CBC with Differential/Platelet - Sedimentation rate   6. Medication management  - CBC with Differential/Platelet - COMPLETE METABOLIC PANEL WITH GFR - Magnesium - Lipid panel - TSH - Hemoglobin A1c - Insulin, random - VITAMIN D 25 Hydroxy  - Sedimentation  rate          Discussed  regular exercise, BP monitoring, weight control to achieve/maintain BMI less than 25 and discussed med and SE's. Recommended labs to assess and monitor clinical status with further disposition pending results of labs.  I discussed the assessment and treatment plan with the patient. The patient was provided an opportunity to ask questions and all were answered. The patient agreed with the plan and demonstrated an understanding of the instructions.  I provided over 30 minutes of exam, counseling, chart review and  complex critical decision making.         The patient was advised to call back or seek an in-person evaluation if the symptoms worsen or if the condition fails to improve as anticipated.   Marinus Maw, MD

## 2022-12-05 ENCOUNTER — Encounter: Payer: Self-pay | Admitting: Internal Medicine

## 2022-12-05 ENCOUNTER — Ambulatory Visit (INDEPENDENT_AMBULATORY_CARE_PROVIDER_SITE_OTHER): Payer: Medicare Other | Admitting: Internal Medicine

## 2022-12-05 VITALS — BP 130/70 | HR 66 | Temp 97.9°F | Resp 16 | Ht 61.0 in | Wt 136.0 lb

## 2022-12-05 DIAGNOSIS — R7309 Other abnormal glucose: Secondary | ICD-10-CM | POA: Diagnosis not present

## 2022-12-05 DIAGNOSIS — E559 Vitamin D deficiency, unspecified: Secondary | ICD-10-CM

## 2022-12-05 DIAGNOSIS — Z23 Encounter for immunization: Secondary | ICD-10-CM

## 2022-12-05 DIAGNOSIS — E782 Mixed hyperlipidemia: Secondary | ICD-10-CM

## 2022-12-05 DIAGNOSIS — K52831 Collagenous colitis: Secondary | ICD-10-CM

## 2022-12-05 DIAGNOSIS — R0989 Other specified symptoms and signs involving the circulatory and respiratory systems: Secondary | ICD-10-CM | POA: Diagnosis not present

## 2022-12-05 DIAGNOSIS — Z79899 Other long term (current) drug therapy: Secondary | ICD-10-CM

## 2022-12-06 ENCOUNTER — Other Ambulatory Visit: Payer: Self-pay | Admitting: Internal Medicine

## 2022-12-06 LAB — CBC WITH DIFFERENTIAL/PLATELET
Absolute Lymphocytes: 1110 {cells}/uL (ref 850–3900)
Absolute Monocytes: 321 {cells}/uL (ref 200–950)
Basophils Absolute: 37 {cells}/uL (ref 0–200)
Basophils Relative: 0.5 %
Eosinophils Absolute: 51 {cells}/uL (ref 15–500)
Eosinophils Relative: 0.7 %
HCT: 40.1 % (ref 35.0–45.0)
Hemoglobin: 13.1 g/dL (ref 11.7–15.5)
MCH: 30.6 pg (ref 27.0–33.0)
MCHC: 32.7 g/dL (ref 32.0–36.0)
MCV: 93.7 fL (ref 80.0–100.0)
MPV: 10.1 fL (ref 7.5–12.5)
Monocytes Relative: 4.4 %
Neutro Abs: 5782 {cells}/uL (ref 1500–7800)
Neutrophils Relative %: 79.2 %
Platelets: 258 10*3/uL (ref 140–400)
RBC: 4.28 10*6/uL (ref 3.80–5.10)
RDW: 12.7 % (ref 11.0–15.0)
Total Lymphocyte: 15.2 %
WBC: 7.3 10*3/uL (ref 3.8–10.8)

## 2022-12-06 LAB — COMPLETE METABOLIC PANEL WITH GFR
AG Ratio: 2 (calc) (ref 1.0–2.5)
ALT: 15 U/L (ref 6–29)
AST: 18 U/L (ref 10–35)
Albumin: 4.2 g/dL (ref 3.6–5.1)
Alkaline phosphatase (APISO): 79 U/L (ref 37–153)
BUN: 16 mg/dL (ref 7–25)
CO2: 25 mmol/L (ref 20–32)
Calcium: 9.4 mg/dL (ref 8.6–10.4)
Chloride: 109 mmol/L (ref 98–110)
Creat: 0.68 mg/dL (ref 0.50–1.05)
Globulin: 2.1 g/dL (ref 1.9–3.7)
Glucose, Bld: 113 mg/dL — ABNORMAL HIGH (ref 65–99)
Potassium: 4.3 mmol/L (ref 3.5–5.3)
Sodium: 142 mmol/L (ref 135–146)
Total Bilirubin: 0.4 mg/dL (ref 0.2–1.2)
Total Protein: 6.3 g/dL (ref 6.1–8.1)
eGFR: 95 mL/min/{1.73_m2} (ref >=60)

## 2022-12-06 LAB — VITAMIN D 25 HYDROXY (VIT D DEFICIENCY, FRACTURES): Vit D, 25-Hydroxy: 71 ng/mL (ref 30–100)

## 2022-12-06 LAB — TSH: TSH: 0.52 m[IU]/L (ref 0.40–4.50)

## 2022-12-06 LAB — MAGNESIUM: Magnesium: 2.2 mg/dL (ref 1.5–2.5)

## 2022-12-06 LAB — LIPID PANEL
Cholesterol: 190 mg/dL (ref <200)
HDL: 37 mg/dL — ABNORMAL LOW (ref >=50)
LDL Cholesterol (Calc): 115 mg/dL — ABNORMAL HIGH
Non-HDL Cholesterol (Calc): 153 mg/dL — ABNORMAL HIGH (ref <130)
Total CHOL/HDL Ratio: 5.1 (calc) — ABNORMAL HIGH (ref <5.0)
Triglycerides: 266 mg/dL — ABNORMAL HIGH (ref <150)

## 2022-12-06 LAB — INSULIN, RANDOM: Insulin: 44.6 u[IU]/mL — ABNORMAL HIGH

## 2022-12-06 LAB — HEMOGLOBIN A1C
Hgb A1c MFr Bld: 5.8 %{Hb} — ABNORMAL HIGH (ref <5.7)
Mean Plasma Glucose: 120 mg/dL
eAG (mmol/L): 6.6 mmol/L

## 2022-12-06 LAB — SEDIMENTATION RATE: Sed Rate: 11 mm/h (ref 0–30)

## 2022-12-06 MED ORDER — ROSUVASTATIN CALCIUM 20 MG PO TABS: ORAL_TABLET | ORAL | 3 refills | Status: DC

## 2022-12-06 NOTE — Progress Notes (Signed)
<>*<>*<>*<>*<>*<>*<>*<>*<>*<>*<>*<>*<>*<>*<>*<>*<>*<>*<>*<>*<>*<>*<>*<>*<> <>*<>*<>*<>*<>*<>*<>*<>*<>*<>*<>*<>*<>*<>*<>*<>*<>*<>*<>*<>*<>*<>*<>*<>*<>   -    Total  Chol =   190   -  is slightly Elevated                                               (  Ideal  or  Goal is less than 180  !  ) & -  Bad / Dangerous LDL  Chol = 115    - Still very  Elevated                                               ( Ideal  or  Goal is less than 70  !  )  - So sending in a different Medicine                                for Cholesterol  (Rosuvastatin / Crestor)                                                    to replace Simvastatin (Zocor)  <>*<>*<>*<>*<>*<>*<>*<>*<>*<>*<>*<>*<>*<>*<>*<>*<>*<>*<>*<>*<>*<>*<>*<>*<> <>*<>*<>*<>*<>*<>*<>*<>*<>*<>*<>*<>*<>*<>*<>*<>*<>*<>*<>*<>*<>*<>*<>*<>*<>  -  A1c = 5.8% - still slightly elevated Blood sugar,  So Recommend    - Avoid Sweets, Candy & White Stuff   - White Rice, White [place], White Flour  - Breads &  Pasta  <>*<>*<>*<>*<>*<>*<>*<>*<>*<>*<>*<>*<>*<>*<>*<>*<>*<>*<>*<>*<>*<>*<>*<>*<> <>*<>*<>*<>*<>*<>*<>*<>*<>*<>*<>*<>*<>*<>*<>*<>*<>*<>*<>*<>*<>*<>*<>*<>*<>  -  Vitamin D = 71 - Great - Please keep dosage same  <>*<>*<>*<>*<>*<>*<>*<>*<>*<>*<>*<>*<>*<>*<>*<>*<>*<>*<>*<>*<>*<>*<>*<>*<> <>*<>*<>*<>*<>*<>*<>*<>*<>*<>*<>*<>*<>*<>*<>*<>*<>*<>*<>*<>*<>*<>*<>*<>*<>  -  Sed Rate Low -- Great     <>*<>*<>*<>*<>*<>*<>*<>*<>*<>*<>*<>*<>*<>*<>*<>*<>*<>*<>*<>*<>*<>*<>*<>*<> <>*<>*<>*<>*<>*<>*<>*<>*<>*<>*<>*<>*<>*<>*<>*<>*<>*<>*<>*<>*<>*<>*<>*<>*<>  -

## 2023-02-06 ENCOUNTER — Telehealth: Payer: Self-pay | Admitting: Nurse Practitioner

## 2023-02-06 NOTE — Telephone Encounter (Signed)
Patient states that she needs an order sent over to the [facility] at [facility] on [address] To have her bone denisty screening done.

## 2023-02-12 ENCOUNTER — Encounter: Payer: Self-pay | Admitting: Nurse Practitioner

## 2023-02-12 ENCOUNTER — Other Ambulatory Visit: Payer: Self-pay | Admitting: Nurse Practitioner

## 2023-02-12 DIAGNOSIS — Z13820 Encounter for screening for osteoporosis: Secondary | ICD-10-CM

## 2023-03-01 ENCOUNTER — Other Ambulatory Visit: Payer: Self-pay

## 2023-03-01 MED ORDER — BISOPROLOL-HYDROCHLOROTHIAZIDE 10-6.25 MG PO TABS: ORAL_TABLET | ORAL | 0 refills | Status: DC

## 2023-03-08 ENCOUNTER — Ambulatory Visit: Payer: Medicare Other | Admitting: Nurse Practitioner

## 2023-04-26 ENCOUNTER — Encounter: Payer: Self-pay | Admitting: Family Medicine

## 2023-04-26 ENCOUNTER — Other Ambulatory Visit: Payer: Self-pay | Admitting: Family Medicine

## 2023-04-26 ENCOUNTER — Ambulatory Visit: Payer: Medicare Other | Admitting: Family Medicine

## 2023-04-26 VITALS — BP 127/82 | HR 58 | Ht 61.0 in | Wt 136.1 lb

## 2023-04-26 DIAGNOSIS — R197 Diarrhea, unspecified: Secondary | ICD-10-CM

## 2023-04-26 DIAGNOSIS — K52831 Collagenous colitis: Secondary | ICD-10-CM | POA: Diagnosis not present

## 2023-04-26 DIAGNOSIS — Z1231 Encounter for screening mammogram for malignant neoplasm of breast: Secondary | ICD-10-CM

## 2023-04-26 DIAGNOSIS — I10 Essential (primary) hypertension: Secondary | ICD-10-CM

## 2023-04-26 DIAGNOSIS — Z7689 Persons encountering health services in other specified circumstances: Secondary | ICD-10-CM

## 2023-04-26 DIAGNOSIS — E782 Mixed hyperlipidemia: Secondary | ICD-10-CM | POA: Diagnosis not present

## 2023-04-26 MED ORDER — HYDROCHLOROTHIAZIDE 12.5 MG PO TABS: 12.5000 mg | ORAL_TABLET | Freq: Every day | ORAL | 3 refills | Status: AC

## 2023-04-26 NOTE — Patient Instructions (Signed)
 It was nice to see you today,  We addressed the following topics today: -I stopped your medicine called Ziac.  This has a beta-blocker called bisoprolol and it.  This can lower your heart rate. - I have added hydrochlorothiazide back by itself.  This was the other medicine and your Ziac. - Continue checking your blood pressure and heart rate.  If you notice your blood pressure is too high let us know. - If your heart rate or symptoms of fatigue and dyspnea do not improve after stopping the Ziac please let us know.  Have a great day,  Frederic Jericho, MD

## 2023-04-26 NOTE — Assessment & Plan Note (Addendum)
 Takes a half a tab of Zofran twice daily for diarrhea that initially started in around 2020

## 2023-04-26 NOTE — Assessment & Plan Note (Signed)
 Continue Crestor

## 2023-04-26 NOTE — Progress Notes (Signed)
 New Patient Office Visit  Subjective   Patient ID: Vicki Hansen, female    DOB: [birth year]  Age: 68 y.o. MRN: [id number]  CC:  Chief Complaint  Patient presents with   New Patient (Initial Visit)    HPI Vicki Hansen presents to establish care Previous patient of Dr. Oneta Rack.  Patient also saw Dr. Leone Payor for her colitis.  Sees Dr. Elmer Picker for her eyes.  Patient's only concern is low heart rate in the 50s, occasionally 40s, measured on blood pressure cuff and pulse oximeter at home.  She takes bisoprolol in the form of Ziac.  She also complains of increased fatigue and feeling her legs are "heavy".  Patient has chronic diarrhea for the past 4 to 5 years and takes a half a tablet of Zofran in the morning and evening.  PMH: htn, hld, uc,   Meds: asa, zyrtec, b12, ziac, enalapil, cestor, zofran  PSH: histerectomy, knee scope, colonscopy, c-section  FH: see ehr  Tobacco use: no Alcohol use: no Drug use: no Marital status: married. 1 child.  Passed away.   Employment: semi-retired.    Screenings:  Colon Cancer: uptodate.   Breast cancer:   not up to date.  Lung Cancer: no   Outpatient Encounter Medications as of 04/26/2023  Medication Sig   Acetaminophen (TYLENOL PO) Take by mouth.   aspirin EC 81 MG tablet Take 81 mg by mouth daily.   CALCIUM PO Take 600 mg by mouth daily.   cetirizine (ZYRTEC) 10 MG tablet Take 10 mg by mouth daily.   Cholecalciferol (VITAMIN D PO) Take 5,000 Int'l Units by mouth daily. Tuesday, Thursday, Saturday   Cyanocobalamin (VITAMIN B-12) 5000 MCG SUBL Place 5,000 mcg under the tongue once a week.   diphenoxylate-atropine (LOMOTIL) 2.5-0.025 MG tablet Take 1 tablet by mouth 4 (four) times daily as needed for diarrhea or loose stools.   enalapril (VASOTEC) 20 MG tablet TAKE 1 TABLET BY MOUTH EVERY DAY FOR BLOOD PRESSURE   hydrochlorothiazide (HYDRODIURIL) 12.5 MG tablet Take 1 tablet (12.5 mg total) by mouth daily.   hyoscyamine (LEVSIN SL) 0.125  MG SL tablet DISSOLVE 1 TABLET UNDER THE TONGUE EVERY 4 HOURS AS NEEDED FOR CRAMPS OR NAUSEA   Multiple Vitamin (MULTIVITAMIN) capsule Take 1 capsule by mouth daily.   ondansetron (ZOFRAN) 8 MG tablet Take 1/2 to 1 tablet 3 x / day as needed for Nausea or Diarrhea   Probiotic Product (PROBIOTIC BLEND) CAPS Takes 1 daily   (Ultimate Blend Probiotic 150 Billion)   rosuvastatin (CRESTOR) 20 MG tablet Take 1 tablet Daily for Cholesterol   [DISCONTINUED] bisoprolol-hydrochlorothiazide (ZIAC) 10-6.25 MG tablet TAKE 1 TABLET BY MOUTH EVERY MORNING FOR BLOOD PRESSURE   [DISCONTINUED] budesonide (ENTOCORT EC) 3 MG 24 hr capsule TAKE 3 CAPSULES(9 MG) BY MOUTH EVERY MORNING   No facility-administered encounter medications on file as of 04/26/2023.    Past Medical History:  Diagnosis Date   Allergy    Arthritis    BACK    Bell's palsy    Cancer (HCC)    UTERINE    Cataract    MILD   Collagenous colitis    GERD (gastroesophageal reflux disease)    Hyperlipidemia    Hypertension    Iritis    HLA B27 +   Prediabetes    S/P ORIF (open reduction internal fixation) fracture 1994   Right tib/fib   Thyroiditis, subacute    Vitamin D deficiency     Past Surgical History:  Procedure Laterality Date   ABDOMINAL HYSTERECTOMY     Uterine cancer   CESAREAN SECTION     COLONOSCOPY  03/17/2005   HEMS    FRACTURE SURGERY     Have metal rod in right leg   KNEE ARTHROSCOPY Left 1972   right leg repair  1994   rod    ROBOTIC ASSISTED TOTAL HYSTERECTOMY  2015   pt. states she had robot assisted hysterectomy and uterus was removed abdominally due to scar tissue.  she also states that both tubes and ovaries were removed.   STAPEDECTOMY Left 2003   TONSILLECTOMY AND ADENOIDECTOMY      Family History  Problem Relation Age of Onset   Cancer Mother        uterine, kidney   Hypertension Mother    Colon polyps Mother    Stomach cancer Mother    Hearing loss Mother    Heart disease Mother    Kidney  disease Mother    Stroke Mother    Diabetes Father    Hypertension Father    Stroke Father    Cancer Father        kidney   Kidney disease Father    Uterine cancer Sister    Colon cancer Maternal Grandmother    Colon cancer Maternal Grandfather    Heart disease Paternal Grandmother    Breast cancer Paternal Aunt    Cancer Maternal Aunt        stomach, colon, bladder   Kidney disease Maternal Aunt    Cancer Maternal Uncle        stomach, colon, bladder   Kidney disease Maternal Uncle    Esophageal cancer Neg Hx    Rectal cancer Neg Hx     Social History   Socioeconomic History   Marital status: Married    Spouse name: Not on file   Number of children: 1   Years of education: Not on file   Highest education level: 12th grade  Occupational History   Not on file  Tobacco Use   Smoking status: Never   Smokeless tobacco: Never  Vaping Use   Vaping status: Never Used  Substance and Sexual Activity   Alcohol use: No   Drug use: No   Sexual activity: Yes    Partners: Male    Birth control/protection: Post-menopausal, Surgical  Other Topics Concern   Not on file  Social History Narrative   Married with 1 child   Never tobacco no drug use no alcohol   Social Drivers of Corporate investment banker Strain: Low Risk  (04/19/2023)   Overall Financial Resource Strain (CARDIA)    Difficulty of Paying Living Expenses: Not hard at all  Food Insecurity: No Food Insecurity (04/19/2023)   Hunger Vital Sign    Worried About Running Out of Food in the Last Year: Never true    Ran Out of Food in the Last Year: Never true  Transportation Needs: No Transportation Needs (04/19/2023)   PRAPARE - Administrator, Civil Service (Medical): No    Lack of Transportation (Non-Medical): No  Physical Activity: Insufficiently Active (04/19/2023)   Exercise Vital Sign    Days of Exercise per Week: 3 days    Minutes of Exercise per Session: 30 min  Stress: Stress Concern Present  (04/19/2023)   [employer] of Occupational Health - Occupational Stress Questionnaire    Feeling of Stress : To some extent  Social Connections: Moderately Integrated (04/19/2023)  Social Advertising account executive [NHANES]    Frequency of Communication with Friends and Family: More than three times a week    Frequency of Social Gatherings with Friends and Family: Twice a week    Attends Religious Services: More than 4 times per year    Active Member of [employer] or Organizations: No    Attends Engineer, structural: Not on file    Marital Status: Married  Intimate Partner Violence: Unknown (04/22/2021)   Received from [employer], [facility]   HITS    Physically Hurt: Not on file    Insult or Talk Down To: Not on file    Threaten Physical Harm: Not on file    Scream or Curse: Not on file    ROS     Objective   BP 127/82   Pulse (!) 58   Ht 5\' 1"  (1.549 m)   Wt 136 lb 1.9 oz (61.7 kg)   SpO2 96%   BMI 25.72 kg/m   Physical Exam General: Alert, oriented HEENT: PERRLA, EOMI, moist mucosa CV: Rate rate and Pulmonary: Lungs clear bilaterally GI: Soft, normal bowel sounds MSK: Strength equal 5 Riced bilateral Psych: Pleasant affect Skin warm and dry      Assessment & Plan:   Encounter to establish care  Collagenous colitis Assessment & Plan: Managed by Dr. Leone Payor, Gastroenterology   Hyperlipidemia, mixed Assessment & Plan: Continue Crestor   Primary hypertension Assessment & Plan: Continue enalapril.  Stopping Ziac 2 remove bisoprolol and restarting hydrochlorothiazide by itself.  Stopping bisoprolol due to low heart rate complaint of easily being fatigued   Diarrhea, unspecified type Assessment & Plan: Takes a half a tab of Zofran twice daily for diarrhea that initially started in around 2020   Other orders -     hydroCHLOROthiazide; Take 1 tablet (12.5 mg total) by mouth daily.  Dispense: 90 tablet; Refill: 3    Return in about 4  weeks (around 05/24/2023) for HTN, hld.   Sandre Kitty, MD

## 2023-04-26 NOTE — Assessment & Plan Note (Signed)
 Managed by Dr. Leone Payor, Gastroenterology

## 2023-04-26 NOTE — Assessment & Plan Note (Signed)
 Continue enalapril.  Stopping Ziac 2 remove bisoprolol and restarting hydrochlorothiazide by itself.  Stopping bisoprolol due to low heart rate complaint of easily being fatigued

## 2023-05-02 ENCOUNTER — Other Ambulatory Visit: Payer: Self-pay | Admitting: *Deleted

## 2023-05-02 ENCOUNTER — Ambulatory Visit
Admission: RE | Admit: 2023-05-02 | Discharge: 2023-05-02 | Disposition: A | Discharge status: Home / Self Care | Source: Ambulatory Visit | Attending: Family Medicine | Admitting: Family Medicine

## 2023-05-02 DIAGNOSIS — R7303 Prediabetes: Secondary | ICD-10-CM

## 2023-05-02 DIAGNOSIS — Z1231 Encounter for screening mammogram for malignant neoplasm of breast: Secondary | ICD-10-CM

## 2023-05-02 DIAGNOSIS — I10 Essential (primary) hypertension: Secondary | ICD-10-CM

## 2023-05-02 DIAGNOSIS — E782 Mixed hyperlipidemia: Secondary | ICD-10-CM

## 2023-05-02 DIAGNOSIS — E559 Vitamin D deficiency, unspecified: Secondary | ICD-10-CM

## 2023-05-03 ENCOUNTER — Other Ambulatory Visit

## 2023-05-03 DIAGNOSIS — I10 Essential (primary) hypertension: Secondary | ICD-10-CM

## 2023-05-03 DIAGNOSIS — E782 Mixed hyperlipidemia: Secondary | ICD-10-CM

## 2023-05-03 DIAGNOSIS — E559 Vitamin D deficiency, unspecified: Secondary | ICD-10-CM

## 2023-05-03 DIAGNOSIS — R7303 Prediabetes: Secondary | ICD-10-CM

## 2023-05-04 LAB — COMPREHENSIVE METABOLIC PANEL WITH GFR
ALT: 15 IU/L (ref 0–32)
AST: 25 IU/L (ref 0–40)
Albumin: 4.7 g/dL (ref 3.9–4.9)
Alkaline Phosphatase: 103 IU/L (ref 44–121)
BUN/Creatinine Ratio: 19 (ref 12–28)
BUN: 16 mg/dL (ref 8–27)
Bilirubin Total: 0.3 mg/dL (ref 0.0–1.2)
CO2: 21 mmol/L (ref 20–29)
Calcium: 9.6 mg/dL (ref 8.7–10.3)
Chloride: 102 mmol/L (ref 96–106)
Creatinine, Ser: 0.86 mg/dL (ref 0.57–1.00)
Globulin, Total: 2 g/dL (ref 1.5–4.5)
Glucose: 125 mg/dL — ABNORMAL HIGH (ref 70–99)
Potassium: 4.2 mmol/L (ref 3.5–5.2)
Sodium: 141 mmol/L (ref 134–144)
Total Protein: 6.7 g/dL (ref 6.0–8.5)
eGFR: 74 mL/min/{1.73_m2} (ref >59)

## 2023-05-04 LAB — CBC WITH DIFFERENTIAL/PLATELET
Basophils Absolute: 0 10*3/uL (ref 0.0–0.2)
Basos: 1 %
EOS (ABSOLUTE): 0.2 10*3/uL (ref 0.0–0.4)
Eos: 4 %
Hematocrit: 42.4 % (ref 34.0–46.6)
Hemoglobin: 14 g/dL (ref 11.1–15.9)
Immature Grans (Abs): 0 10*3/uL (ref 0.0–0.1)
Immature Granulocytes: 0 %
Lymphocytes Absolute: 1.6 10*3/uL (ref 0.7–3.1)
Lymphs: 36 %
MCH: 30 pg (ref 26.6–33.0)
MCHC: 33 g/dL (ref 31.5–35.7)
MCV: 91 fL (ref 79–97)
Monocytes Absolute: 0.4 10*3/uL (ref 0.1–0.9)
Monocytes: 9 %
Neutrophils Absolute: 2.2 10*3/uL (ref 1.4–7.0)
Neutrophils: 50 %
Platelets: 248 10*3/uL (ref 150–450)
RBC: 4.67 x10E6/uL (ref 3.77–5.28)
RDW: 12.7 % (ref 11.7–15.4)
WBC: 4.4 10*3/uL (ref 3.4–10.8)

## 2023-05-04 LAB — VITAMIN D 25 HYDROXY (VIT D DEFICIENCY, FRACTURES): Vit D, 25-Hydroxy: 102 ng/mL — ABNORMAL HIGH (ref 30.0–100.0)

## 2023-05-04 LAB — HEMOGLOBIN A1C
Est. average glucose Bld gHb Est-mCnc: 131 mg/dL
Hgb A1c MFr Bld: 6.2 % — ABNORMAL HIGH (ref 4.8–5.6)

## 2023-05-04 LAB — LIPID PANEL
Chol/HDL Ratio: 3.5 ratio (ref 0.0–4.4)
Cholesterol, Total: 108 mg/dL (ref 100–199)
HDL: 31 mg/dL — ABNORMAL LOW (ref >39)
LDL Chol Calc (NIH): 52 mg/dL (ref 0–99)
Triglycerides: 141 mg/dL (ref 0–149)
VLDL Cholesterol Cal: 25 mg/dL (ref 5–40)

## 2023-05-06 ENCOUNTER — Encounter: Payer: Self-pay | Admitting: Family Medicine

## 2023-05-28 ENCOUNTER — Other Ambulatory Visit: Payer: Self-pay | Admitting: Family

## 2023-05-28 ENCOUNTER — Ambulatory Visit (INDEPENDENT_AMBULATORY_CARE_PROVIDER_SITE_OTHER): Admitting: Family Medicine

## 2023-05-28 ENCOUNTER — Encounter: Payer: Self-pay | Admitting: Family Medicine

## 2023-05-28 VITALS — BP 124/82 | HR 79 | Ht 61.0 in | Wt 133.7 lb

## 2023-05-28 DIAGNOSIS — Z7189 Other specified counseling: Secondary | ICD-10-CM | POA: Insufficient documentation

## 2023-05-28 DIAGNOSIS — Z789 Other specified health status: Secondary | ICD-10-CM | POA: Insufficient documentation

## 2023-05-28 DIAGNOSIS — I10 Essential (primary) hypertension: Secondary | ICD-10-CM | POA: Diagnosis not present

## 2023-05-28 NOTE — Patient Instructions (Signed)
 It was nice to see you today,  We addressed the following topics today: -Continue your hydrochlorothiazide  and enalapril .  No other changes to your medications.  Blood pressure looks good.  We will see back in 6 months. - I have updated your chart to include the blood products and the DNR status.  Have a great day,  Etha Henle, MD

## 2023-05-28 NOTE — Progress Notes (Signed)
   Established Patient Office Visit  Subjective   Patient ID: Vicki Hansen, female    DOB: 1955/06/24  Age: 68 y.o. MRN: [id number]  Chief Complaint  Patient presents with   Hypertension   Hyperlipidemia    HPI  Subjective - Fatigue improved after stopping bisoprolol  - Blood pressure readings improved - Reports resuming exercise - No longer following low fiber diet - Patient has a DNR going back to 2017.  Keeps it with her in her purse.  Medications: hydrochlorothiazide , enalapril , rosuvastatin  (Crestor ), vitamin D  2000 units three times weekly (Tuesday, Thursday, Saturday)  PMH, PSH, FH, Social Hx: Hypertension, hyperlipidemia, pre-diabetes, Jehovah's Witness (declines blood products)  ROS: Denies shortness of breath  The ASCVD Risk score (Arnett DK, et al., 2019) failed to calculate for the following reasons:   The valid total cholesterol range is 130 to 320 mg/dL  Health Maintenance Due  Topic Date Due   DTaP/Tdap/Td (2 - Td or Tdap) 07/17/2017   COVID-19 Vaccine (4 - 2024-25 season) 09/17/2022      Objective:     BP 124/82   Pulse 79   Ht 5\' 1"  (1.549 m)   Wt 133 lb 11.2 oz (60.6 kg)   SpO2 96%   BMI 25.26 kg/m    Physical Exam General: Alert, oriented Pulmonary: No respiratory distress Psych: Pleasant affect   No results found for any visits on 05/28/23.      Assessment & Plan:   Primary hypertension Assessment & Plan: Continue enalapril  and hydrochlorothiazide .  Fatigue much better after stopping bisoprolol    No blood products  Advanced care planning/counseling discussion Assessment & Plan: Discussed DNR status with pt.  Has a document uploaded since 2017.  She keeps hard copy in her wallet.    Orders: -     Do not attempt resuscitation (DNR)     Return in about 6 months (around 11/28/2023) for hld, HTN.    Laneta Pintos, MD

## 2023-05-28 NOTE — Assessment & Plan Note (Signed)
 Discussed DNR status with pt.  Has a document uploaded since 2017.  She keeps hard copy in her wallet.

## 2023-05-28 NOTE — Assessment & Plan Note (Signed)
 Continue enalapril  and hydrochlorothiazide .  Fatigue much better after stopping bisoprolol 

## 2023-05-29 ENCOUNTER — Other Ambulatory Visit: Payer: Self-pay | Admitting: Family

## 2023-06-01 ENCOUNTER — Encounter: Payer: Medicare Other | Admitting: Internal Medicine

## 2023-06-06 ENCOUNTER — Other Ambulatory Visit: Payer: Self-pay | Admitting: Family Medicine

## 2023-06-06 ENCOUNTER — Encounter: Payer: Medicare Other | Admitting: Internal Medicine

## 2023-06-06 MED ORDER — ROSUVASTATIN CALCIUM 20 MG PO TABS: ORAL_TABLET | ORAL | 3 refills | Status: AC

## 2023-06-06 NOTE — Telephone Encounter (Unsigned)
 Copied from CRM [id number]. Topic: Clinical - Medication Refill >> Jun 06, 2023  9:57 AM Antwanette L wrote: Medication: rosuvastatin  (CRESTOR ) 20 MG tablet   Has the patient contacted their pharmacy? Yes (Agent: If no, request that the patient contact the pharmacy for the refill. If patient does not wish to contact the pharmacy document the reason why and proceed with request.) (Agent: If yes, when and what did the pharmacy advise?)  This is the patient's preferred pharmacy:  [id number] - [place], [place] - [address] AT [address] [place] [address] Phone: [phone] Fax: [phone] Hours: Not open 24 hours     Is this the correct pharmacy for this prescription? Yes If no, delete pharmacy and type the correct one.   Has the prescription been filled recently? No. Last refilled on 12/06/22  Is the patient out of the medication? No. Patient will be out of medicine on 06/08/23  Has the patient been seen for an appointment in the last year OR does the patient have an upcoming appointment? Yes. Last ov with Dr. Arabella Beach was 04/26/23  Can we respond through MyChart? No. Contact patient by phone at [phone]  Agent: Please be advised that Rx refills may take up to 3 business days. We ask that you follow-up with your pharmacy.

## 2023-06-18 ENCOUNTER — Ambulatory Visit: Payer: Medicare Other | Admitting: Family Medicine

## 2023-06-25 ENCOUNTER — Other Ambulatory Visit: Payer: Self-pay | Admitting: Family Medicine

## 2023-06-25 DIAGNOSIS — K52831 Collagenous colitis: Secondary | ICD-10-CM

## 2023-08-27 ENCOUNTER — Ambulatory Visit: Payer: Medicare Other | Admitting: Nurse Practitioner

## 2023-08-28 ENCOUNTER — Encounter: Payer: Self-pay | Admitting: Internal Medicine

## 2023-08-28 ENCOUNTER — Ambulatory Visit (INDEPENDENT_AMBULATORY_CARE_PROVIDER_SITE_OTHER): Admitting: Internal Medicine

## 2023-08-28 VITALS — BP 120/78 | HR 77 | Ht 61.0 in | Wt 126.0 lb

## 2023-08-28 DIAGNOSIS — K52831 Collagenous colitis: Secondary | ICD-10-CM

## 2023-08-28 NOTE — Patient Instructions (Signed)
 Great to see you today.  _______________________________________________________  If your blood pressure at your visit was 140/90 or greater, please contact your primary care physician to follow up on this.  _______________________________________________________  If you are age 68 or older, your body mass index should be between 23-30. Your Body mass index is 23.81 kg/m. If this is out of the aforementioned range listed, please consider follow up with your Primary Care Provider.  If you are age 57 or younger, your body mass index should be between 19-25. Your Body mass index is 23.81 kg/m. If this is out of the aformentioned range listed, please consider follow up with your Primary Care Provider.   ________________________________________________________  The [facility] providers would like to encourage you to use MYCHART to communicate with providers for non-urgent requests or questions.  Due to long hold times on the telephone, sending your provider a message by [facility] may be a faster and more efficient way to get a response.  Please allow 48 business hours for a response.  Please remember that this is for non-urgent requests.  _______________________________________________________  [facility] is using a team-based approach to care.  Your team is made up of your doctor and two to three APPS. Our APPS (Nurse Practitioners and Physician Assistants) work with your physician to ensure care continuity for you. They are fully qualified to address your health concerns and develop a treatment plan. They communicate directly with your gastroenterologist to care for you. Seeing the Advanced Practice Practitioners on your physician's team can help you by facilitating care more promptly, often allowing for earlier appointments, access to diagnostic testing, procedures, and other specialty referrals.   Try to stay off the ondansetron .  I appreciate the opportunity to care for you. Lupita Commander, MD,FACG

## 2023-08-28 NOTE — Progress Notes (Signed)
 Vicki Hansen 68 y.o. 1955-03-09 [id number]  Assessment & Plan:   Encounter Diagnosis  Name Primary?   Collagenous colitis Yes   The patient is clinically well at this time and not affected by her collagenous colitis.  She can remain off budesonide  at this time.  Ondansetron  has been stopped as well.  She may use ondansetron  or her Lomotil  as needed and if she gets persistent diarrhea again she is instructed to contact me for advice.  She would likely need to restart budesonide .   Subjective:  Review of pertinent gastrointestinal problems: 1.  Routine risk for colon cancer.  Colonoscopy April 2018 showed no polyps.  Internal hemorrhoids were noted.  She was recommended to have repeat colonoscopy at 10-year interval for colon cancer screening.  Colonoscopy 2008 was normal. 2.  Chronic diarrhea led to colonoscopy November 2020.  Terminal ileum was normal.  There was mild erythema and granularity throughout the colon.  Biopsies were taken.  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  Budesonide  was too expensive.  Imodium caused cramping in nausea but was quite effective even at low doses.  Tried to prescribe mesalamine  January 2021 however ended up with sulfasalazine  1gram TID due to her insurance restrictions.  Prednisone  trial with good partial response.  Again we tried prescribing Entocort 3 mg pills 3 pills once daily.  Very good response as of November 2021.  Switched to lower dose Entocort at 1 pill once daily and started full-strength balsalazide 3 pills 3 times daily and 1 Imodium pill once daily.  Colonoscopy 09/2020 showed normal terminal ileum which was biopsied, right colon was normal-appearing and was biopsied, left colon was very mildly erythematous and was biopsied.  All colon biopsies showed collagenous colitis.  I recommended she continue the budesonide  9 mg daily and also start cholestyramine  4 g twice  daily, also stop balsalazide completely.  Duodenal biopsies at EGD in September 2022 negative for celiac disease.  Celiac serologies have also been negative and IgA normal   3.  Right-sided abdominal pains June 2022 led to testing including CT scan abdomen pelvis with IV and oral contrast which showed gallstones but otherwise was s essentially normal.,      09/2021 [facility] NP visit came off budesonide  and cholestyramine    06/22/22 [facility] - recurrent diarrhea - rx budesonide  9 mg every day prn Lomotil  08/31/2022 tapering down to 3 mg budesonide  recommended to continue   --------------------------------------------------------------------------------------------------------  Chief Complaint: Collagenous colitis  HPI 68 year old woman with the above problems, collagenous colitis that has been treated with budesonide .  When seen in August 2024 she was tapering down trying to find the lowest effective dose.  In the interim she was able to stop budesonide .  She has continued to use ondansetron  to help with diarrhea but has weaned off of that recently.  She actually gets constipated occasionally and uses MiraLAX.  So overall regular formed bowel movements.  Able to eat salads again, eating differently. Exercising more.  Intentionally losing some weight.  Wt Readings from Last 3 Encounters:  08/28/23 126 lb (57.2 kg)  05/28/23 133 lb 11.2 oz (60.6 kg)  04/26/23 136 lb 1.9 oz (61.7 kg)     No Known Allergies Current Meds  Medication Sig   Acetaminophen (TYLENOL PO) Take by mouth.   aspirin EC 81 MG tablet Take 81 mg by mouth daily.   CALCIUM  PO Take 600 mg by mouth daily.   cetirizine (ZYRTEC) 10  MG tablet Take 10 mg by mouth daily.   Cholecalciferol (VITAMIN D  PO) Take 5,000 Int'l Units by mouth daily. Tuesday, Thursday, Saturday   Cyanocobalamin (VITAMIN B-12) 5000 MCG SUBL Place 5,000 mcg under the tongue once a week.   diphenoxylate -atropine  (LOMOTIL ) 2.5-0.025 MG tablet Take 1 tablet by mouth 4  (four) times daily as needed for diarrhea or loose stools.   enalapril  (VASOTEC ) 20 MG tablet TAKE 1 TABLET BY MOUTH EVERY DAY FOR BLOOD PRESSURE   hydrochlorothiazide  (HYDRODIURIL ) 12.5 MG tablet Take 1 tablet (12.5 mg total) by mouth daily.   hyoscyamine  (LEVSIN  SL) 0.125 MG SL tablet DISSOLVE 1 TABLET UNDER THE TONGUE EVERY 4 HOURS AS NEEDED FOR CRAMPS OR NAUSEA   Multiple Vitamin (MULTIVITAMIN) capsule Take 1 capsule by mouth daily.   ondansetron  (ZOFRAN ) 8 MG tablet TAKE 1/2 TO 1 TABLET BY MOUTH THREE TIMES DAILY AS NEEDED FOR NAUSEA OR DIARRHEA   Probiotic Product (PROBIOTIC BLEND) CAPS Takes 1 daily   (Ultimate Blend Probiotic 150 Billion)   rosuvastatin  (CRESTOR ) 20 MG tablet Take 1 tablet Daily for Cholesterol   Past Medical History:  Diagnosis Date   Allergy    Arthritis    BACK    Bell's palsy    Cancer (HCC)    UTERINE    Cataract    MILD   Collagenous colitis    GERD (gastroesophageal reflux disease)    Hyperlipidemia    Hypertension    Iritis    HLA B27 +   Prediabetes    S/P ORIF (open reduction internal fixation) fracture 1994   Right tib/fib   Thyroiditis, subacute    Vitamin D  deficiency    Past Surgical History:  Procedure Laterality Date   ABDOMINAL HYSTERECTOMY     Uterine cancer   CESAREAN SECTION     COLONOSCOPY  03/17/2005   HEMS    FRACTURE SURGERY     Have metal rod in right leg   KNEE ARTHROSCOPY Left 1972   right leg repair  1994   rod    ROBOTIC ASSISTED TOTAL HYSTERECTOMY  2015   pt. states she had robot assisted hysterectomy and uterus was removed abdominally due to scar tissue.  she also states that both tubes and ovaries were removed.   STAPEDECTOMY Left 2003   TONSILLECTOMY AND ADENOIDECTOMY     Social History   Social History Narrative   Married with 1 child   Never tobacco no drug use no alcohol   family history includes Breast cancer in her paternal aunt; Cancer in her father, maternal aunt, maternal uncle, and mother; Colon  cancer in her maternal grandfather and maternal grandmother; Colon polyps in her mother; Diabetes in her father; Hearing loss in her mother; Heart disease in her mother and paternal grandmother; Hypertension in her father and mother; Kidney disease in her father, maternal aunt, maternal uncle, and mother; Stomach cancer in her mother; Stroke in her father and mother; Uterine cancer in her sister.   Review of Systems As above  Objective:   Physical Exam BP 120/78   Pulse 77   Ht 5' 1 (1.549 m)   Wt 126 lb (57.2 kg)   BMI 23.81 kg/m

## 2023-09-13 ENCOUNTER — Ambulatory Visit: Payer: Medicare Other | Admitting: Nurse Practitioner

## 2023-11-28 ENCOUNTER — Encounter: Payer: Self-pay | Admitting: Family Medicine

## 2023-11-28 ENCOUNTER — Ambulatory Visit: Admitting: Family Medicine

## 2023-11-28 VITALS — BP 139/79 | HR 83 | Ht 61.0 in | Wt 131.8 lb

## 2023-11-28 DIAGNOSIS — E559 Vitamin D deficiency, unspecified: Secondary | ICD-10-CM

## 2023-11-28 DIAGNOSIS — I10 Essential (primary) hypertension: Secondary | ICD-10-CM | POA: Diagnosis not present

## 2023-11-28 DIAGNOSIS — R7303 Prediabetes: Secondary | ICD-10-CM

## 2023-11-28 DIAGNOSIS — E673 Hypervitaminosis D: Secondary | ICD-10-CM | POA: Diagnosis not present

## 2023-11-28 DIAGNOSIS — E782 Mixed hyperlipidemia: Secondary | ICD-10-CM | POA: Diagnosis not present

## 2023-11-28 DIAGNOSIS — K5190 Ulcerative colitis, unspecified, without complications: Secondary | ICD-10-CM

## 2023-11-28 NOTE — Assessment & Plan Note (Signed)
 Stable on current regimen. Last cholesterol panel was within normal limits. - Continue Crestor . - Will check cholesterol panel annually.

## 2023-11-28 NOTE — Assessment & Plan Note (Signed)
 Taking otc vit d 3x week.  Last check was milidly elevated > 100.  Will recheck today.

## 2023-11-28 NOTE — Patient Instructions (Addendum)
 It was nice to see you today,  We addressed the following topics today: - I have ordered a lab test to check your vitamin D  level. - you can go to your pharmacy to get the tetanus shot. - Please schedule an appointment for a full physical exam in 6 months.  Have a great day,  Rolan Slain, MD

## 2023-11-28 NOTE — Progress Notes (Signed)
   Established Patient Office Visit  Subjective   Patient ID: Vicki Hansen, female    DOB: [birth year]  Age: 68 y.o. MRN: [id number]  Chief Complaint  Patient presents with   Medical Management of Chronic Issues    HPI  Subjective - Follow-up for hypertension and hypercholesterolemia. - Reports no new issues. - No side effects from medications, such as muscle aches. - Checks blood pressure at home, reports readings are usually better than in-office. - Received flu and COVID-19 vaccines a few weeks ago. Unsure of which COVID-19 vaccine was administered. - Aware that tetanus vaccination is due.  Medications No changes to medications. Currently taking enalapril , hydrochlorothiazide , Crestor  (rosuvastatin ), vitamin D  three times a week, baby aspirin daily, and vitamin B12 on Mondays. Infrequently takes hyoscyamine  and lamotrigine.  PMH, PSH, FH, Social Hx PMHx: Hypertension, hypercholesterolemia, pre-diabetes, vitamin D  deficiency. History of shingles vaccine (two doses) and pneumococcal vaccine.  ROS Constitutional: Denies myalgias.   The ASCVD Risk score (Arnett DK, et al., 2019) failed to calculate for the following reasons:   The valid total cholesterol range is 130 to 320 mg/dL  Health Maintenance Due  Topic Date Due   Zoster Vaccines- Shingrix (1 of 2) 09/25/1974   DTaP/Tdap/Td (2 - Td or Tdap) 07/17/2017   DEXA SCAN  Never done   Medicare Annual Wellness (AWV)  08/25/2023   COVID-19 Vaccine (4 - 2025-26 season) 09/17/2023      Objective:     BP 139/79   Pulse 83   Ht 5' 1 (1.549 m)   Wt 131 lb 12.8 oz (59.8 kg)   SpO2 97%   BMI 24.90 kg/m    Physical Exam Gen: alert, oriented CV: Regular rate and rhythm, no murmurs, rubs, or gallops. PULM: Lungs clear to auscultation bilaterally. Psych: pleasant affect   No results found for any visits on 11/28/23.      Assessment & Plan:   Primary hypertension Assessment & Plan: Hypertension: Stable on current  regimen. BP is better controlled at home. - Continue enalapril  and hydrochlorothiazide .   Hypervitaminosis D -     VITAMIN D  25 Hydroxy (Vit-D Deficiency, Fractures)  Hyperlipidemia, mixed Assessment & Plan: Stable on current regimen. Last cholesterol panel was within normal limits. - Continue Crestor . - Will check cholesterol panel annually.   Vitamin D  deficiency Assessment & Plan: Taking otc vit d 3x week.  Last check was milidly elevated > 100.  Will recheck today.       Return in about 6 months (around 05/27/2024) for physical.    Toribio MARLA Slain, MD

## 2023-11-28 NOTE — Assessment & Plan Note (Signed)
 Hypertension: Stable on current regimen. BP is better controlled at home. - Continue enalapril  and hydrochlorothiazide .

## 2024-03-06 ENCOUNTER — Ambulatory Visit

## 2024-05-22 ENCOUNTER — Other Ambulatory Visit

## 2024-05-29 ENCOUNTER — Encounter: Admitting: Family Medicine
